# Patient Record
Sex: Male | Born: 1955 | Race: White | Hispanic: No | Smoking: Never smoker
Health system: Southern US, Community
[De-identification: ages and names within clinical notes are randomized; demographics above are authoritative.]

## PROBLEM LIST (undated history)

## (undated) DIAGNOSIS — Z8619 Personal history of other infectious and parasitic diseases: Secondary | ICD-10-CM

## (undated) HISTORY — DX: Personal history of other infectious and parasitic diseases: Z86.19

---

## 2003-07-16 HISTORY — PX: OTHER SURGICAL HISTORY: SHX169

## 2005-07-02 ENCOUNTER — Emergency Department: Payer: Self-pay | Admitting: Emergency Medicine

## 2006-10-23 ENCOUNTER — Ambulatory Visit: Payer: Self-pay

## 2007-08-31 DIAGNOSIS — I10 Essential (primary) hypertension: Secondary | ICD-10-CM | POA: Insufficient documentation

## 2007-08-31 DIAGNOSIS — R7303 Prediabetes: Secondary | ICD-10-CM | POA: Insufficient documentation

## 2007-08-31 DIAGNOSIS — F17200 Nicotine dependence, unspecified, uncomplicated: Secondary | ICD-10-CM | POA: Insufficient documentation

## 2007-08-31 DIAGNOSIS — K219 Gastro-esophageal reflux disease without esophagitis: Secondary | ICD-10-CM | POA: Insufficient documentation

## 2007-12-14 DIAGNOSIS — E782 Mixed hyperlipidemia: Secondary | ICD-10-CM | POA: Insufficient documentation

## 2008-08-02 DIAGNOSIS — J309 Allergic rhinitis, unspecified: Secondary | ICD-10-CM | POA: Insufficient documentation

## 2008-10-31 DIAGNOSIS — Z8249 Family history of ischemic heart disease and other diseases of the circulatory system: Secondary | ICD-10-CM | POA: Insufficient documentation

## 2009-05-31 DIAGNOSIS — K429 Umbilical hernia without obstruction or gangrene: Secondary | ICD-10-CM | POA: Insufficient documentation

## 2009-08-15 HISTORY — PX: AORTIC VALVE REPLACEMENT: SHX41

## 2009-08-15 HISTORY — PX: CARDIAC CATHETERIZATION: SHX172

## 2009-08-18 ENCOUNTER — Ambulatory Visit: Payer: Self-pay | Admitting: Cardiovascular Disease

## 2009-08-23 DIAGNOSIS — Z952 Presence of prosthetic heart valve: Secondary | ICD-10-CM | POA: Insufficient documentation

## 2009-09-18 ENCOUNTER — Ambulatory Visit: Payer: Self-pay | Admitting: Family Medicine

## 2009-11-10 DIAGNOSIS — E559 Vitamin D deficiency, unspecified: Secondary | ICD-10-CM | POA: Insufficient documentation

## 2010-11-30 ENCOUNTER — Ambulatory Visit: Payer: Self-pay | Admitting: Family Medicine

## 2010-12-14 HISTORY — PX: COLONOSCOPY: SHX174

## 2010-12-26 ENCOUNTER — Ambulatory Visit: Payer: Self-pay | Admitting: Gastroenterology

## 2010-12-26 LAB — HM COLONOSCOPY

## 2011-06-15 HISTORY — PX: HERNIA REPAIR: SHX51

## 2011-06-28 ENCOUNTER — Ambulatory Visit: Payer: Self-pay | Admitting: Surgery

## 2011-07-05 ENCOUNTER — Ambulatory Visit: Payer: Self-pay | Admitting: Surgery

## 2011-12-24 ENCOUNTER — Ambulatory Visit: Payer: Self-pay | Admitting: Family Medicine

## 2012-02-18 HISTORY — PX: DOPPLER ECHOCARDIOGRAPHY: SHX263

## 2012-12-18 IMAGING — US ABDOMEN ULTRASOUND
1 series · 14 of 25 positions shown · non-contrast
Comparison: none

REASON FOR EXAM: Elevated liver enzymes
COMMENTS:

PROCEDURE:     US  - US ABDOMEN GENERAL SURVEY  - December 24, 2011  [DATE]
RESULT:     Comparison: None
TECHNIQUE: Multiple gray-scale and color-flow Doppler images of the abdomen
are presented for review.

[Series 1: abdomen ultrasound · 0.40mm/px · 14 of 81 slices shown]
[im 1/81]
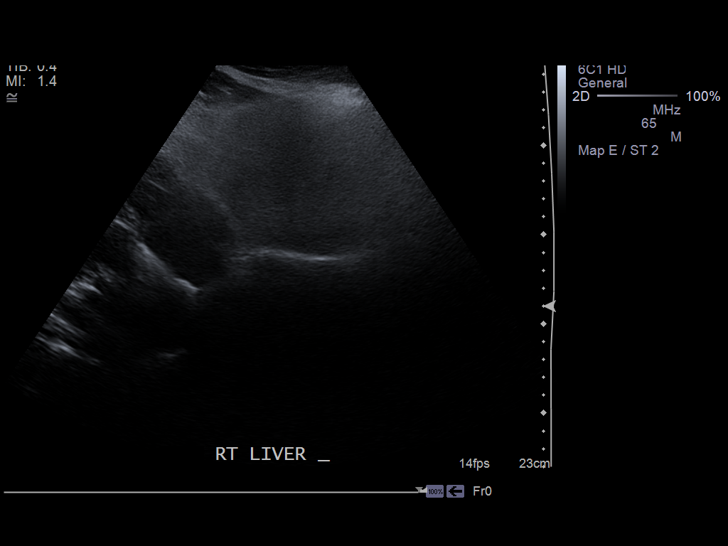
[im 7/81]
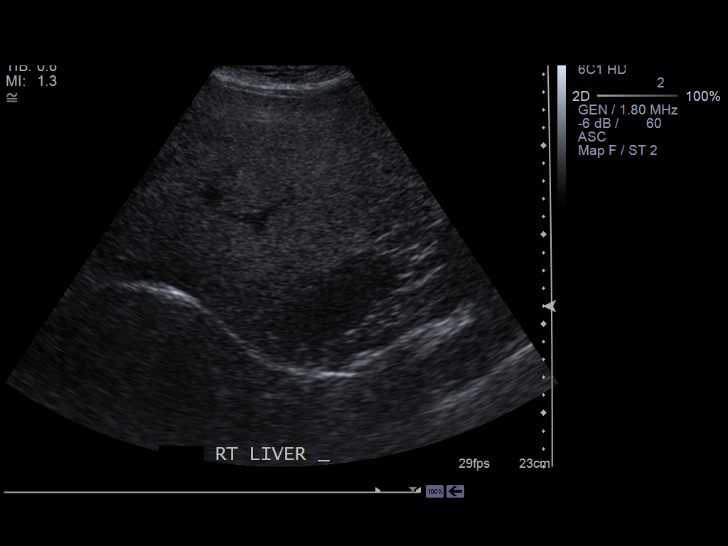
[im 14/81]
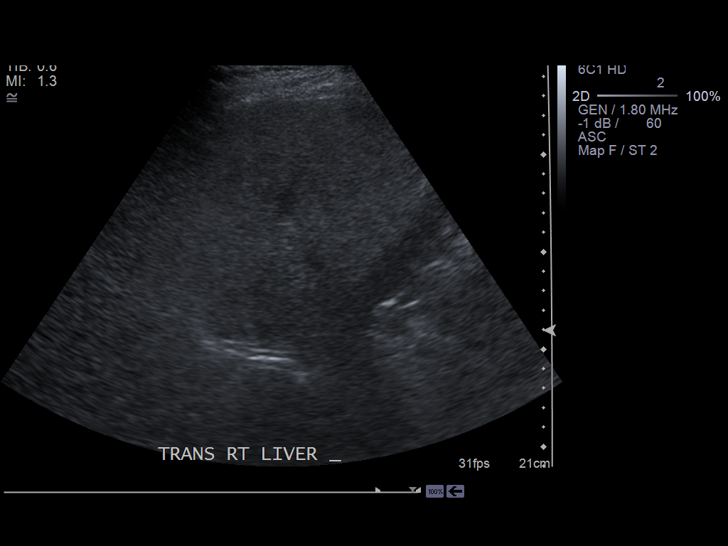
[im 21/81]
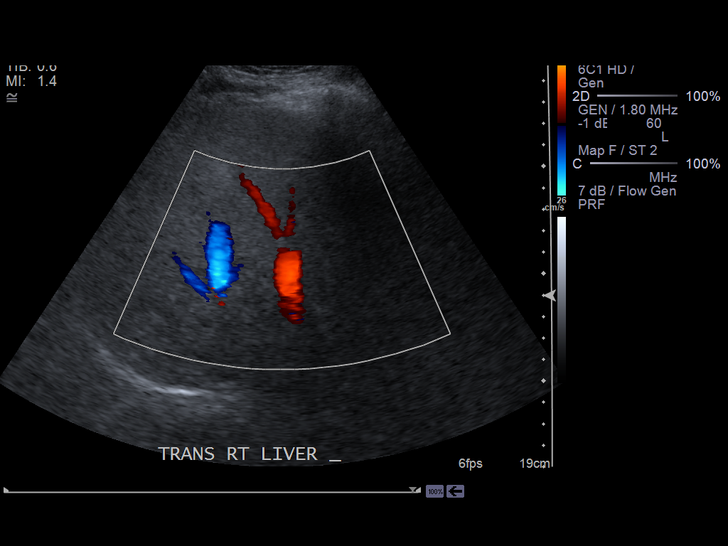
[im 27/81]
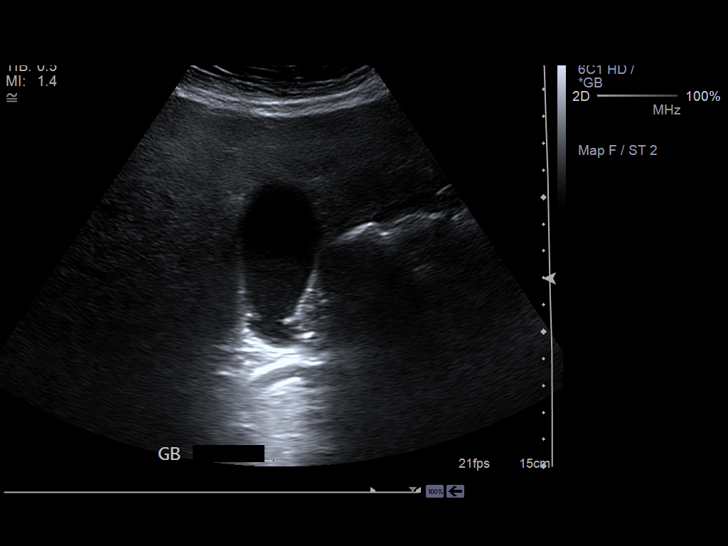
[im 31/81]
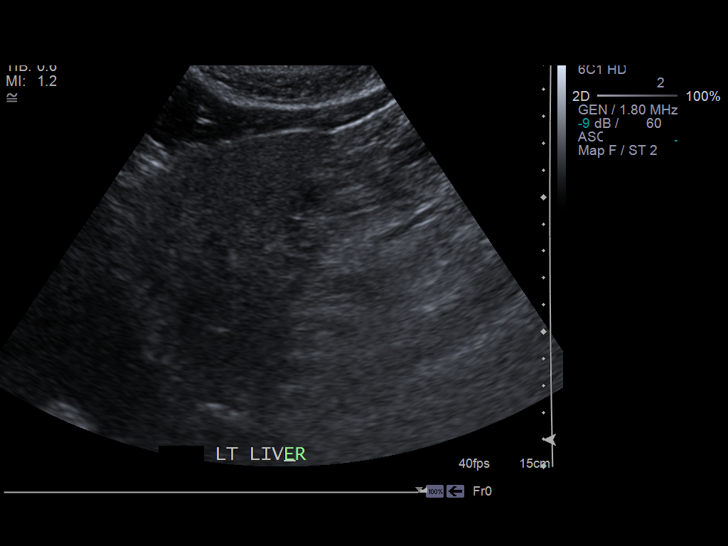
[im 37/81]
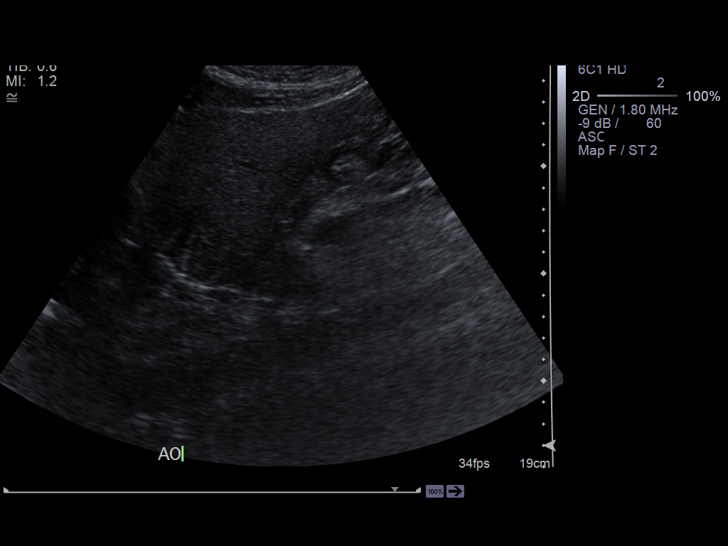
[im 44/81]
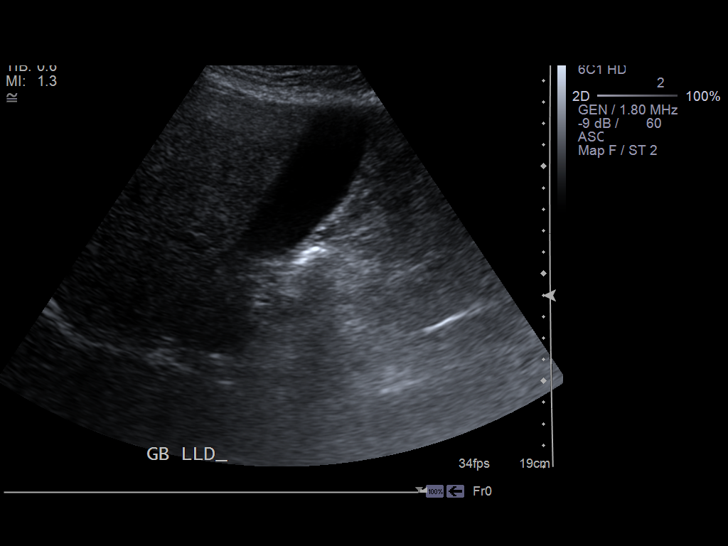
[im 51/81]
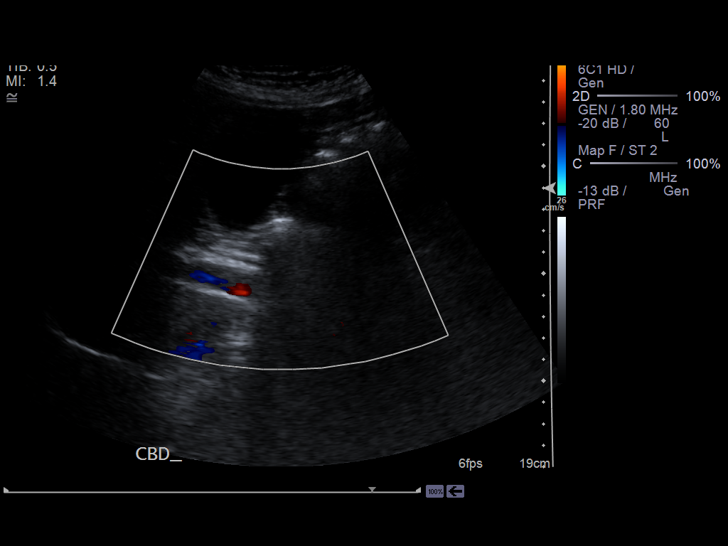
[im 54/81]
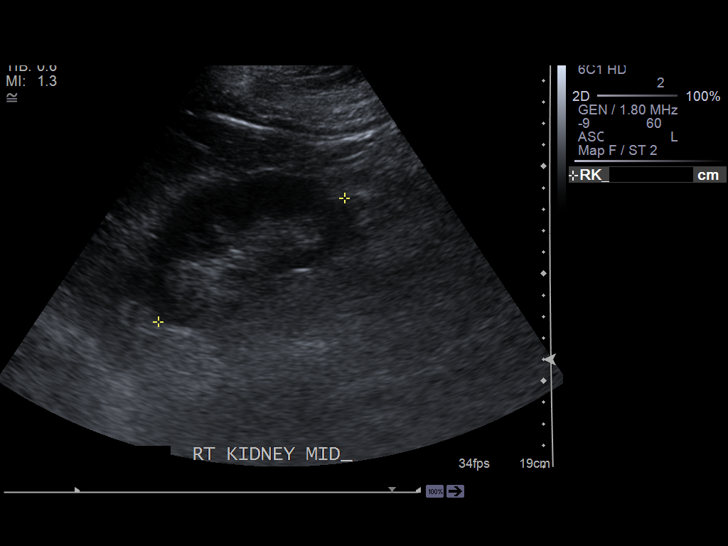
[im 61/81]
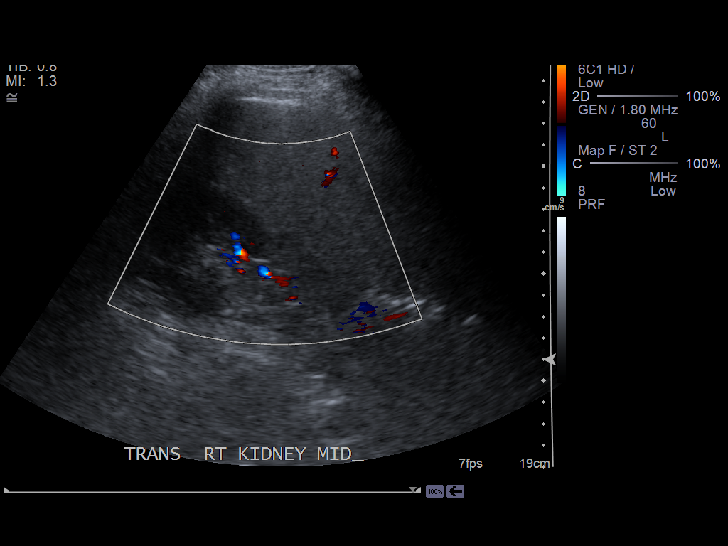
[im 67/81]
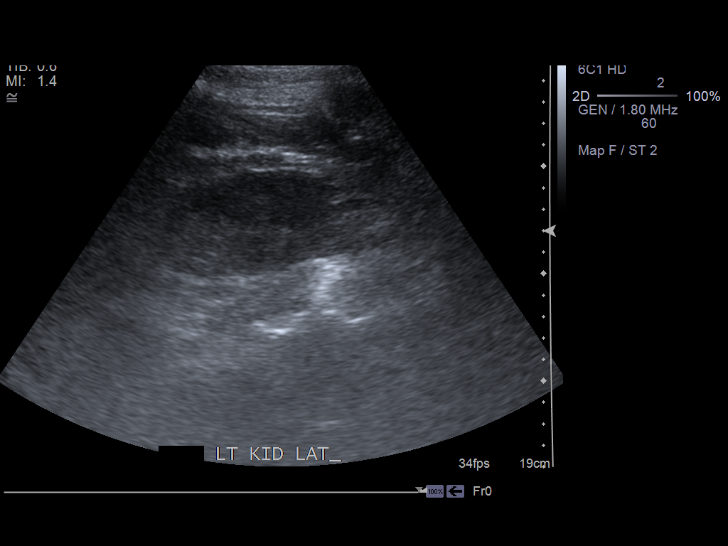
[im 74/81]
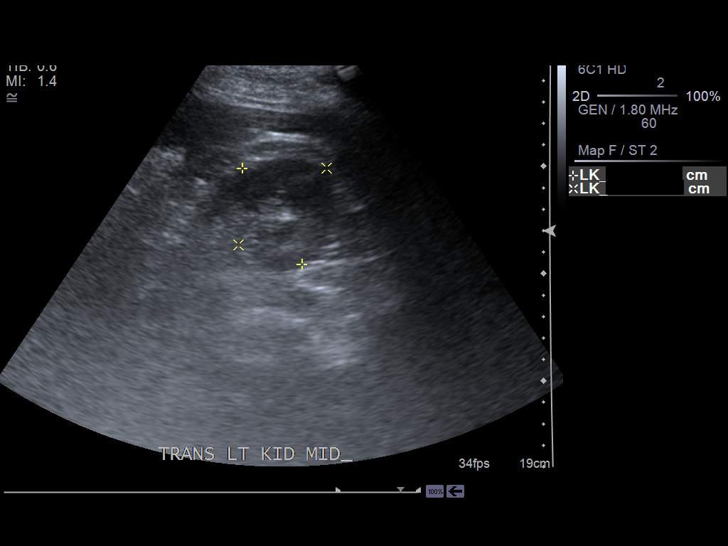
[im 81/81]
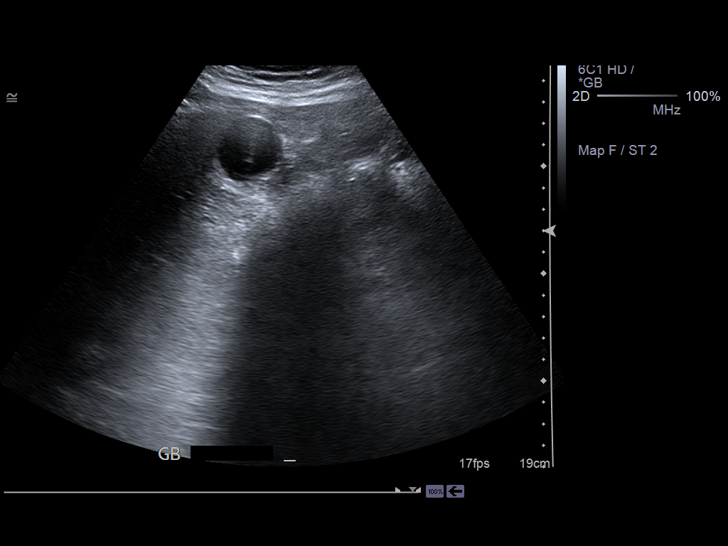

[14 of 25 positions shown; findings below may reference images not displayed]

FINDINGS: The liver demonstrates a coarse echotexture. The liver is without evidence
of a focal hepatic lesion.

There is no cholelithiasis or biliary sludge. There is no intra- or
extrahepatic biliary ductal dilatation. The common duct measures 5.2 mm in
maximal diameter. There is no gallbladder wall thickening, pericholecystic
fluid, or sonographic Murphy's sign.

The visualized portion of the pancreas is normal in echogenicity. The spleen
is unremarkable. Bilateral kidneys are normal in echogenicity and size. The
right kidney measures 10.4 x 5.7 x 5.9 cm. The left kidney measures 11.5 x
5.3 x 5.5 cm. There are no renal calculi or hydronephrosis. The abdominal
aorta and IVC are unremarkable.
IMPRESSION: The liver demonstrates a coarse echotexture which can be seen with
hepatocellular disease.

## 2013-03-24 ENCOUNTER — Ambulatory Visit: Payer: Self-pay | Admitting: Family Medicine

## 2013-04-14 ENCOUNTER — Emergency Department: Payer: Self-pay | Admitting: Internal Medicine

## 2013-04-14 LAB — CBC WITH DIFFERENTIAL/PLATELET
Basophil #: 0.1 10*3/uL (ref 0.0–0.1)
Basophil %: 0.9 %
Eosinophil #: 0.1 10*3/uL (ref 0.0–0.7)
Eosinophil %: 1.8 %
HCT: 37.3 % — ABNORMAL LOW (ref 40.0–52.0)
HGB: 13.3 g/dL (ref 13.0–18.0)
Lymphocyte #: 0.9 10*3/uL — ABNORMAL LOW (ref 1.0–3.6)
Lymphocyte %: 12.8 %
MCH: 34.1 pg — ABNORMAL HIGH (ref 26.0–34.0)
MCHC: 35.5 g/dL (ref 32.0–36.0)
MCV: 96 fL (ref 80–100)
Monocyte #: 0.7 x10 3/mm (ref 0.2–1.0)
Monocyte %: 9.4 %
Neutrophil #: 5.5 10*3/uL (ref 1.4–6.5)
Neutrophil %: 75.1 %
Platelet: 166 10*3/uL (ref 150–440)
RBC: 3.88 10*6/uL — ABNORMAL LOW (ref 4.40–5.90)
RDW: 13.3 % (ref 11.5–14.5)
WBC: 7.4 10*3/uL (ref 3.8–10.6)

## 2013-04-14 LAB — COMPREHENSIVE METABOLIC PANEL
Albumin: 3.8 g/dL (ref 3.4–5.0)
Anion Gap: 5 — ABNORMAL LOW (ref 7–16)
Calcium, Total: 9.3 mg/dL (ref 8.5–10.1)
Chloride: 96 mmol/L — ABNORMAL LOW (ref 98–107)
Co2: 28 mmol/L (ref 21–32)
EGFR (African American): >60
Glucose: 123 mg/dL — ABNORMAL HIGH (ref 65–99)
Potassium: 4.6 mmol/L (ref 3.5–5.1)
Sodium: 129 mmol/L — ABNORMAL LOW (ref 136–145)

## 2013-04-14 LAB — SEDIMENTATION RATE: Erythrocyte Sed Rate: 16 mm/hr (ref 0–20)

## 2013-04-15 ENCOUNTER — Ambulatory Visit: Payer: Self-pay | Admitting: Specialist

## 2013-04-16 ENCOUNTER — Ambulatory Visit: Payer: Self-pay | Admitting: Specialist

## 2013-04-16 HISTORY — PX: FOOT SURGERY: SHX648

## 2013-10-21 ENCOUNTER — Ambulatory Visit: Payer: Self-pay | Admitting: Family Medicine

## 2013-11-30 ENCOUNTER — Inpatient Hospital Stay: Payer: Self-pay | Admitting: Internal Medicine

## 2013-11-30 LAB — COMPREHENSIVE METABOLIC PANEL
Albumin: 4.2 g/dL (ref 3.4–5.0)
Alkaline Phosphatase: 98 U/L
Anion Gap: 6 — ABNORMAL LOW (ref 7–16)
BILIRUBIN TOTAL: 0.7 mg/dL (ref 0.2–1.0)
BUN: 23 mg/dL — ABNORMAL HIGH (ref 7–18)
CALCIUM: 9 mg/dL (ref 8.5–10.1)
CHLORIDE: 96 mmol/L — AB (ref 98–107)
Co2: 29 mmol/L (ref 21–32)
Creatinine: 1.13 mg/dL (ref 0.60–1.30)
EGFR (African American): >60
Glucose: 134 mg/dL — ABNORMAL HIGH (ref 65–99)
OSMOLALITY: 268 (ref 275–301)
Potassium: 4.5 mmol/L (ref 3.5–5.1)
SGOT(AST): 60 U/L — ABNORMAL HIGH (ref 15–37)
SGPT (ALT): 51 U/L (ref 12–78)
Sodium: 131 mmol/L — ABNORMAL LOW (ref 136–145)
Total Protein: 7.2 g/dL (ref 6.4–8.2)

## 2013-11-30 LAB — CBC WITH DIFFERENTIAL/PLATELET
Comment - H1-Com1: NORMAL
Comment - H1-Com2: NORMAL
HCT: 41.4 % (ref 40.0–52.0)
HGB: 14.2 g/dL (ref 13.0–18.0)
LYMPHS PCT: 13 %
MCH: 33.7 pg (ref 26.0–34.0)
MCHC: 34.3 g/dL (ref 32.0–36.0)
MCV: 98 fL (ref 80–100)
Monocytes: 8 %
NRBC/100 WBC: 1 /
Platelet: 223 10*3/uL (ref 150–440)
RBC: 4.22 10*6/uL — AB (ref 4.40–5.90)
RDW: 13.6 % (ref 11.5–14.5)
SEGMENTED NEUTROPHILS: 79 %
WBC: 10.1 10*3/uL (ref 3.8–10.6)

## 2013-11-30 LAB — URINALYSIS, COMPLETE
BILIRUBIN, UR: NEGATIVE
Bacteria: NONE SEEN
Blood: NEGATIVE
GLUCOSE, UR: NEGATIVE mg/dL (ref 0–75)
Hyaline Cast: 3
Ketone: NEGATIVE
LEUKOCYTE ESTERASE: NEGATIVE
Nitrite: NEGATIVE
Ph: 5 (ref 4.5–8.0)
Protein: NEGATIVE
RBC, UR: NONE SEEN /HPF (ref 0–5)
Specific Gravity: 1.016 (ref 1.003–1.030)
Squamous Epithelial: <1
WBC UR: <1 /HPF (ref 0–5)

## 2013-11-30 LAB — LIPASE, BLOOD: LIPASE: 2060 U/L — AB (ref 73–393)

## 2013-11-30 LAB — APTT: Activated PTT: 37.7 secs — ABNORMAL HIGH (ref 23.6–35.9)

## 2013-11-30 LAB — TROPONIN I: Troponin-I: <0.02 ng/mL

## 2013-11-30 LAB — PROTIME-INR
INR: 3.2
Prothrombin Time: 32.1 secs — ABNORMAL HIGH (ref 11.5–14.7)

## 2013-12-01 LAB — LIPASE, BLOOD: LIPASE: 1126 U/L — AB (ref 73–393)

## 2013-12-01 LAB — LIPID PANEL
CHOLESTEROL: 252 mg/dL — AB (ref 0–200)
HDL Cholesterol: 85 mg/dL — ABNORMAL HIGH (ref 40–60)
LDL CHOLESTEROL, CALC: 153 mg/dL — AB (ref 0–100)
Triglycerides: 68 mg/dL (ref 0–200)
VLDL CHOLESTEROL, CALC: 14 mg/dL (ref 5–40)

## 2013-12-01 LAB — BASIC METABOLIC PANEL
ANION GAP: 7 (ref 7–16)
BUN: 12 mg/dL (ref 7–18)
CHLORIDE: 97 mmol/L — AB (ref 98–107)
CO2: 28 mmol/L (ref 21–32)
Calcium, Total: 8.2 mg/dL — ABNORMAL LOW (ref 8.5–10.1)
Creatinine: 1.04 mg/dL (ref 0.60–1.30)
EGFR (African American): >60
Glucose: 129 mg/dL — ABNORMAL HIGH (ref 65–99)
Osmolality: 266 (ref 275–301)
POTASSIUM: 3.8 mmol/L (ref 3.5–5.1)
SODIUM: 132 mmol/L — AB (ref 136–145)

## 2013-12-02 LAB — LIPASE, BLOOD: LIPASE: 815 U/L — AB (ref 73–393)

## 2013-12-03 LAB — PROTIME-INR
INR: 1.7
PROTHROMBIN TIME: 19.6 s — AB (ref 11.5–14.7)

## 2013-12-03 LAB — LIPASE, BLOOD: Lipase: 821 U/L — ABNORMAL HIGH (ref 73–393)

## 2013-12-05 LAB — CULTURE, BLOOD (SINGLE)

## 2014-03-16 LAB — MICROALBUMIN, URINE: Microalb, Ur: NEGATIVE

## 2014-04-15 LAB — PSA: PSA: 1.1

## 2014-06-20 ENCOUNTER — Ambulatory Visit: Payer: Self-pay | Admitting: Specialist

## 2014-06-20 LAB — POTASSIUM: Potassium: 3.7 mmol/L (ref 3.5–5.1)

## 2014-06-24 ENCOUNTER — Ambulatory Visit: Payer: Self-pay | Admitting: Specialist

## 2014-06-24 LAB — PROTIME-INR
INR: 1.6
PROTHROMBIN TIME: 18.3 s — AB (ref 11.5–14.7)

## 2014-09-15 LAB — BASIC METABOLIC PANEL
BUN: 12 mg/dL (ref 4–21)
Creatinine: 1.1 mg/dL (ref 0.6–1.3)
GLUCOSE: 125 mg/dL
Potassium: 4.7 mmol/L (ref 3.4–5.3)
Sodium: 139 mmol/L (ref 137–147)

## 2014-09-15 LAB — HEMOGLOBIN A1C: HEMOGLOBIN A1C: 5.4

## 2014-09-15 LAB — LIPID PANEL
Cholesterol: 286 mg/dL — AB (ref 0–200)
HDL: 32 mg/dL — AB (ref 35–70)
Triglycerides: 930 mg/dL — AB (ref 40–160)

## 2014-09-15 LAB — HEPATIC FUNCTION PANEL: ALT: 47 U/L — AB (ref 10–40)

## 2014-11-04 NOTE — Op Note (Signed)
PATIENT NAME:  Isaac Hancock, [name] MR#:  [id number] DATE OF BIRTH:  08-14-1955  DATE OF PROCEDURE:  04/16/2013  PREOPERATIVE DIAGNOSIS: Foreign body, left little toe.   POSTOPERATIVE DIAGNOSIS: Foreign body, left little toe.   PROCEDURE: Removal of foreign body, left little toe.   SURGEON: Clare Gandyhristopher E. Hedaya Latendresse, MD   ANESTHESIA: General.   COMPLICATIONS: None.   TOURNIQUET TIME: 18 minutes.   DESCRIPTION OF PROCEDURE: Ancef 1 gram was given intravenously prior to the procedure. General anesthesia is induced. The left foot and lower leg are thoroughly prepped with alcohol and ChloraPrep and draped in standard sterile fashion. The Esmarch is used to wrap out the foot, ankle and lower leg. Tourniquet is elevated about the calf to 350 mmHg. Under loupe magnification, a 1-1/2-inch longitudinal incision is made along the volar lateral aspect of the plantar aspect of the left little toe. The dissection is carefully carried down to the area of the foreign body. Localization is provided with the FluoroScan. The foreign body is located and is seen to be encased within scar tissue. This is removed and is felt to be a 5 mm x 4 mm sharp metallic piece of metal. The wound is thoroughly irrigated multiple times. Skin is closed with 4-0 nylon. Soft bulky dressing is applied. The tourniquet is released. The patient is returned to the recovery room in satisfactory condition, having tolerated the procedure quite well.  ____________________________ Clare Gandyhristopher E. Polina Burmaster, MD ces:OSi D: 04/17/2013 07:40:11 ET T: 04/17/2013 08:15:33 ET JOB#: [id number]  cc: Clare Gandyhristopher E. Tita Terhaar, MD, <Dictator> Clare GandyHRISTOPHER E Abhiram Criado MD ELECTRONICALLY SIGNED 04/28/2013 6:31

## 2014-11-05 NOTE — Op Note (Signed)
PATIENT NAME:  Isaac Hancock, Isaac Hancock MR#:  [id number] DATE OF BIRTH:  Dec 04, 1955  DATE OF PROCEDURE:  06/24/2014  PREOPERATIVE DIAGNOSIS: Bilateral carpal tunnel syndrome.   POSTOPERATIVE DIAGNOSIS: Bilateral carpal tunnel syndrome.   PROCEDURES: 1.  Right carpal tunnel release.  2.  Left carpal tunnel release.   SURGEON: Myra Rudehristopher Ebany Bowermaster, M.D.   ANESTHESIA: General.   COMPLICATIONS: None.   TOURNIQUET TIME: 14 minutes on the right and 8 minutes on the left.   DESCRIPTION OF PROCEDURE: After adequate induction of general anesthesia, both upper extremities were thoroughly prepped with alcohol and ChloraPrep and draped in standard sterile fashion. Identical procedures were performed on each side. The extremity is wrapped out with the Esmarch bandage and pneumatic tourniquet elevated to 250 mmHg. Under loupe magnification, standard volar carpal tunnel incision is made and the dissection carried down to the transverse retinacular ligament. This is incised in the midportion using the knife. The distal release is performed with the small scissors. The proximal release is performed with the small scissors and the carpal tunnel scissors. On each side there is seen to be moderate compression of the median nerve directly beneath the transverse ligament. There is minimal synovitis present and no mass lesion. Careful check both proximally and distally was made to ensure that no residual compression was present. The wound is thoroughly irrigated multiple times. Skin edges are infiltrated with 0.5% plain Marcaine. The skin is closed with 4-0 nylon. A soft bulky dressing is applied. The patient is returned to the recovery room in satisfactory condition having tolerated the procedure quite well.  ____________________________ Clare Gandyhristopher E. Laird Runnion, MD ces:sb D: 06/24/2014 09:00:11 ET T: 06/24/2014 11:43:27 ET JOB#: [id number]  cc: Clare Gandyhristopher E. Kunio Cummiskey, MD, <Dictator> Clare GandyHRISTOPHER E Deyonte Cadden MD ELECTRONICALLY SIGNED  06/25/2014 14:06

## 2014-11-05 NOTE — H&P (Signed)
PATIENT NAME:  Isaac Hancock, Isaac Hancock MR#:  [id number] DATE OF BIRTH:  1956/04/17  DATE OF ADMISSION:  11/30/2013  PRIMARY CARE PHYSICIAN:  Dr. Sherrie MustacheFisher.  CHIEF COMPLAINT:  Abdominal pain, nausea.  HISTORY OF PRESENT ILLNESS:  A 59 year old Caucasian male patient with history of heavy alcohol use of 6 to 12 beers a day presents to the Emergency Room with acute onset of abdominal pain earlier today, along with nausea, did not have any vomiting, diarrhea, melena, hematemesis. Here in the Emergency Room, the patient has been found to have a lipase of 2000, along with CT scan of the abdomen showing acute pancreatitis and is being admitted to the hospitalist service. The patient never had pancreatitis before. He normally drinks 6 to 8 beers a day. Over the weekend, he did have 12 beers. Presently, he seems to have some shaking, tremors. His last drink was yesterday. When I asked him when was the last time he went without a drink for a day, and he mentioned this was today and does not remember the last time he went without a drink for 24 hours.   PAST MEDICAL HISTORY: 1.  History of severe aortic stenosis status post aortic valve replacement.  2.  Hypertension.  3.  Sleep apnea.  4.  Diet-controlled diabetes mellitus.   SOCIAL HISTORY: The patient does not smoke. Drinks 6 to 12 beers a day. No illicit drug use. Lives at home with his wife. 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  CODE STATUS: FULL CODE.   FAMILY HISTORY: CAD.   HOME MEDICATIONS: 1.  Allopurinol 300 mg oral 2 tablets daily.  2.  Enalapril 20 mg 2 times a day.  3.  Metoprolol 200 mg oral once a day.  4.  Norvasc 10 mg oral once a day.  5.  Percocet 5/325 one tablet every 4 hours as needed.  6.  Vitamin D2 50,000 international units once a week.  7.  Vytorin 10/40 oral once a day.  8.  Warfarin 6 mg daily, but 7 mg on Monday, Wednesday and Friday.   REVIEW OF SYSTEMS: CONSTITUTIONAL: Complains of fatigue.  EYES: No blurred  vision, pain or redness.  ENT: No tinnitus, hearing loss. RESPIRATORY: No cough, wheeze, hemoptysis.  CARDIOVASCULAR: No chest pain, orthopnea, edema.  GASTROINTESTINAL: Has nausea and abdominal pain.  GENITOURINARY: No dysuria, hematuria or frequency.  ENDOCRINE: No polyuria, nocturia or thyroid problems.  HEMATOLOGIC AND LYMPHATIC: No anemia, easy bruising, bleeding. INTEGUMENT:  No acne, rash, lesion.  MUSCULOSKELETAL: No back pain, arthritis.  NEUROLOGIC: No focal numbness, weakness, seizure.  PSYCHIATRIC: No anxiety or depression.  LABORATORY:  Today show glucose 134, BUN 23, creatinine 1.13. Sodium 139, potassium 4.5 with lipase 2000. AST, ALT, alkaline phosphatase and bilirubin normal. Troponin less than 0.02. WBC 10.9, hemoglobin 14.2, platelets of 223. INR 3.2.   CT scan of the abdomen shows acute pancreatitis. No pseudocyst or pancreatic abscess.   Urinalysis shows no bacteria. Lactic acid 1.5.   EKG shows normal sinus rhythm.   ASSESSMENT AND PLAN: 1.  Acute alcoholic pancreatitis. We will put the patient on liquid diet at this time, along with pain control. Put her on IV fluids. I have counseled the patient to quit alcohol. He has requested some help. We will consult the case manager for further input with the case. Repeat lipase in the morning. No gallstones found on CT scan.  2.  Hyponatremia secondary to dehydration and alcohol. The patient will be on IV fluids. We  will repeat in the morning.  3.  Aortic valve replacement. The patient is presently on Coumadin. INR is therapeutic at 3.2. We will continue his home medication.  4.  Hypertension. Continue home medication.  5.  Deep venous thrombosis prophylaxis on Coumadin.   TIME SPENT TODAY ON THIS CASE:  40 minutes.   ____________________________ Molinda Bailiff Karn Derk, MD srs:ce D: 11/30/2013 18:30:07 ET T: 11/30/2013 18:48:07 ET JOB#: [id number]  cc: Wardell Heath R. Sharmain Lastra, MD, <Dictator> Demetrios Isaacs. Sherrie Mustache, MD Orie Fisherman  MD ELECTRONICALLY SIGNED 12/10/2013 14:12

## 2014-11-05 NOTE — Discharge Summary (Signed)
PATIENT NAME:  Isaac Hancock, [name] MR#:  [id number] DATE OF BIRTH:  1955-12-09  DATE OF ADMISSION:  11/30/2013 DATE OF DISCHARGE:  12/03/2013  1. Acute alcoholic pancreatitis, now resolved back to baseline and tolerating diet.  2. Hyponatremia secondary to dehydration, now resolved with IV hydration. 3. Hyperlipidemia, on statin.   SECONDARY DIAGNOSES:   1. Severe aortic stenosis, status post aortic valve replacement.  2. Hypertension.  3. Sleep apnea.  4. Diet controlled diabetes mellitus.  CONSULTATIONS: None.   PROCEDURES AND RADIOLOGY:  1. Abdominal ultrasound on 21st of May was negative for any cholelithiasis.  2. CT scan of the abdomen and pelvis with contrast on 19th of May showed acute pancreatitis. No pseudocysts.  3. Abdominal 3-way with PA of chest on 19th of May showed no evidence of acute cardiopulmonary disease.   MAJOR LABORATORY PANEL: UA on admission was negative.   Blood cultures x 2 were negative.   HISTORY AND SHORT HOSPITAL COURSE: The patient is a 59 year old male with above-mentioned medical problems who was admitted for abdominal pain and nausea and was found to have acute pancreatitis. This was thought to be due to alcohol. Please see Dr. Eddie NorthSudini's dictated history and physical for further details. The patient also had hyponatremia which was resolved with IV hydration. The patient's lipase was slowly improving and his diet was slowly advanced to regular which he tolerated and was feeling much better, was close to his baseline on the 22nd of May and was discharged home in stable condition.   VITAL SIGNS: On the date of discharge, as follows: Temperature 98.4, heart rate 78 per minute, respirations 20 per minute, blood pressure 114/70 mmHg. He was saturating 98% on room air.   PHYSICAL EXAMINATION:  On the date of discharge: CARDIOVASCULAR: S1, S2 normal. No murmurs, rubs, gallops. LUNGS: Clear to auscultation bilaterally. No wheezing, rales, rhonchi, or crepitation.   ABDOMEN: Soft, benign.  NEUROLOGIC: Nonfocal examination.   All other physical examination remained at baseline.   DISCHARGE MEDICATIONS:  1. Norvasc 10 mg p.o. at bedtime. 2. Enalapril 20 mg p.o. b.i.d.  3. Metoprolol 200 mg p.o. at bedtime.  4. Vitamin D2 at 50,000 international units once a week on Monday.  5. Allopurinol 300 mg p.o. 2 tablets at bedtime.  6. Vytorin 10/40 mg 1 tablet p.o. at bedtime.  7. Warfarin 5 mg p.o. every other day with 6 mg every other day.  8. Lasix 20 mg p.o. daily.  9. Loratadine 10 mg p.o. daily as needed.   DISCHARGE DIET: Low sodium, low fat, low cholesterol.   DISCHARGE ACTIVITY: As tolerated.   DISCHARGE INSTRUCTIONS AND FOLLOWUP: The patient was instructed to follow up with his primary care physician, Dr. Mila Merryonald Fisher, in 1-2 weeks. He was instructed to check PT/INR on coming Monday on the 25th of May with results forwarded to his primary care physician for Coumadin dose adjustment.   TOTAL TIME DISCHARGING THIS PATIENT: 45 minutes.   ____________________________ Ellamae SiaVipul S. Sherryll BurgerShah, MD vss:dd D: 12/05/2013 16:39:26 ET T: 12/06/2013 02:57:25 ET JOB#: [id number]  cc: Traylon Schimming S. Sherryll BurgerShah, MD, <Dictator> Demetrios Isaac E. Sherrie MustacheFisher, MD  Patricia PesaVIPUL S Barnie Sopko MD ELECTRONICALLY SIGNED 12/07/2013 20:47

## 2014-11-27 IMAGING — US ABDOMEN ULTRASOUND LIMITED
1 series · 14 of 25 positions shown · non-contrast
Comparison: None.

CLINICAL DATA: PANCREATITIS

EXAM:
US ABDOMEN LIMITED - RIGHT UPPER QUADRANT

[Series 1: abdomen ultrasound limited · 0.25mm/px · 14 of 42 slices shown]
[im 1/42]
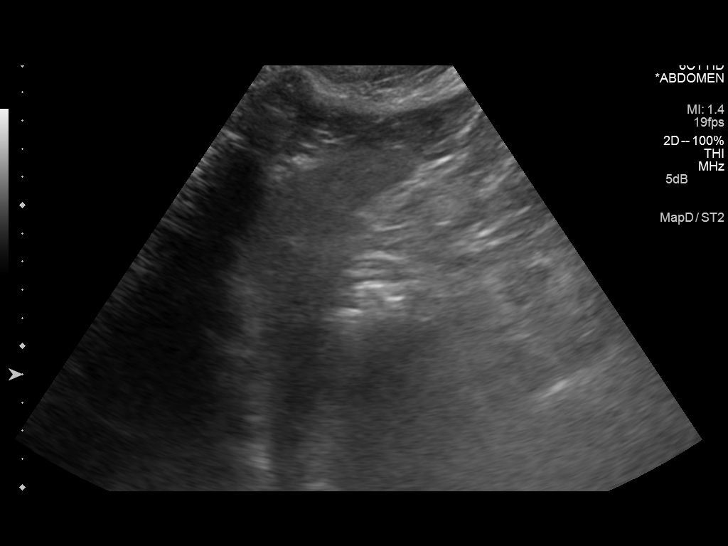
[im 4/42]
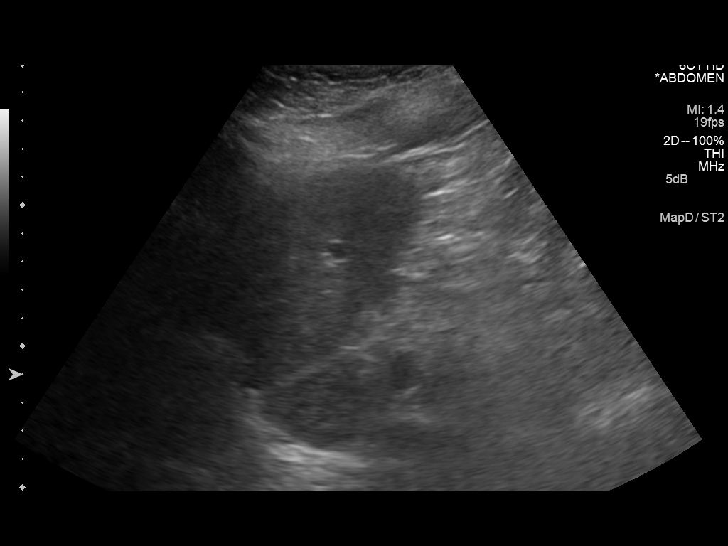
[im 7/42]
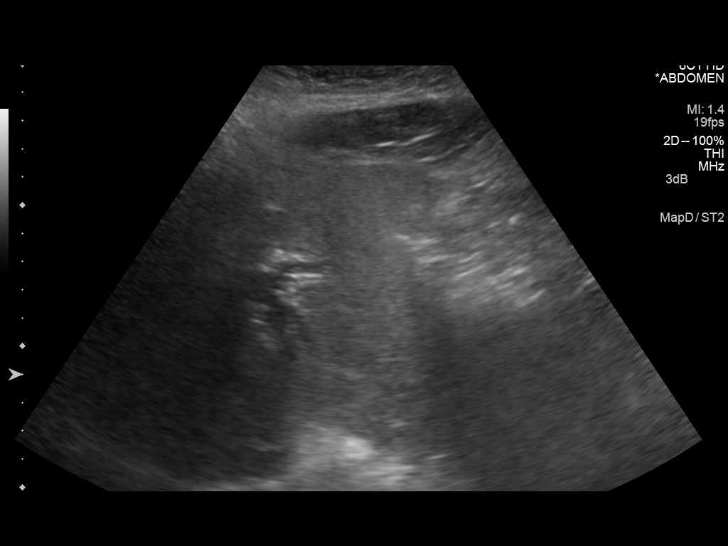
[im 11/42]
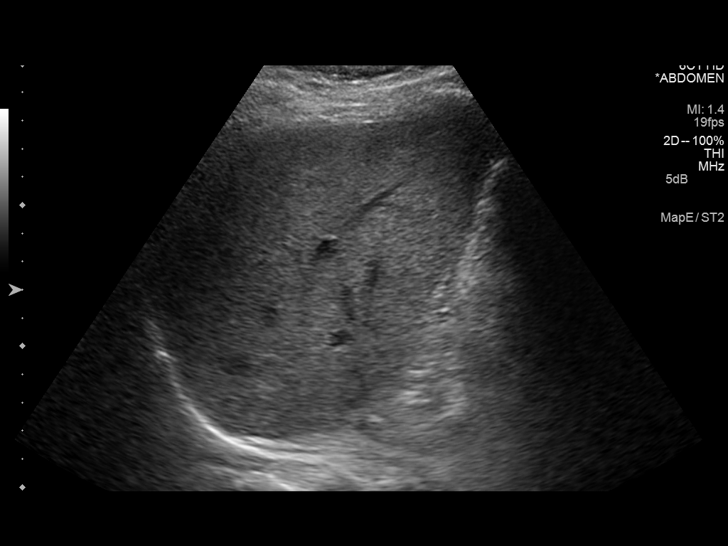
[im 14/42]
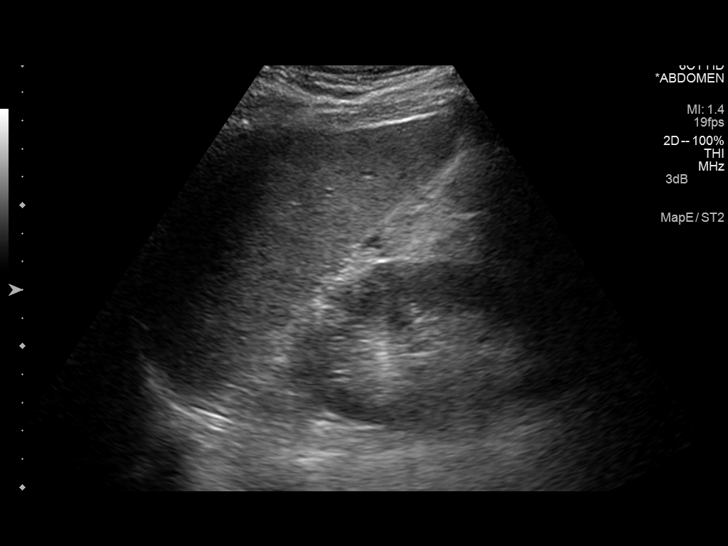
[im 16/42]
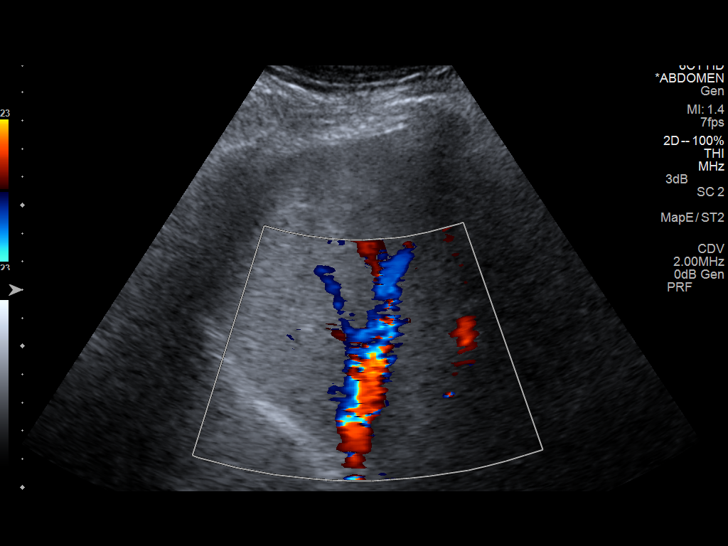
[im 19/42]
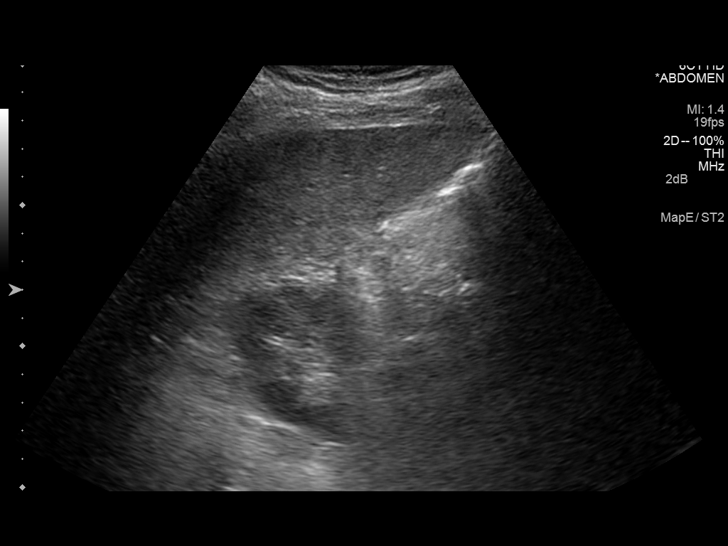
[im 23/42]
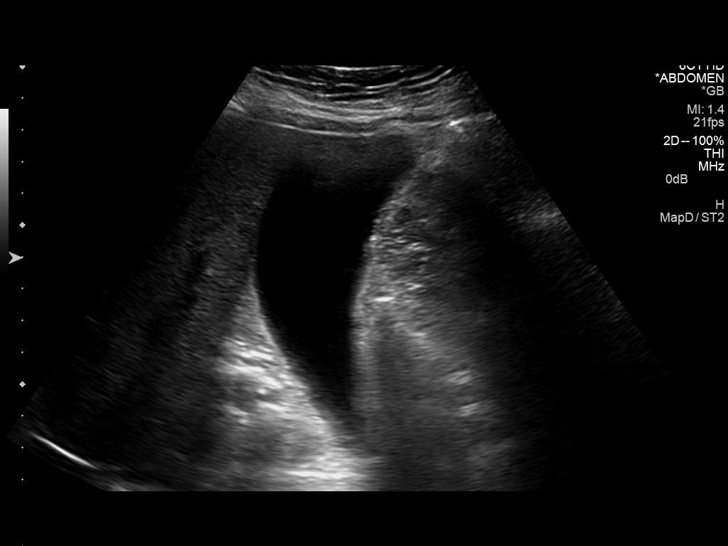
[im 26/42]
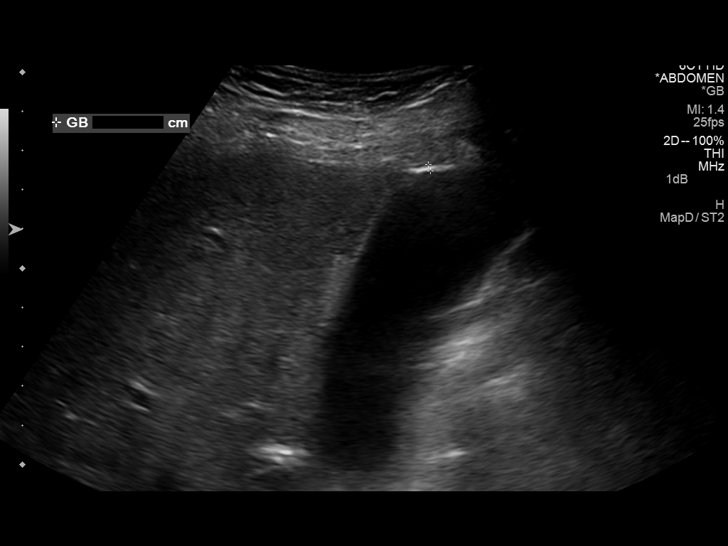
[im 28/42]
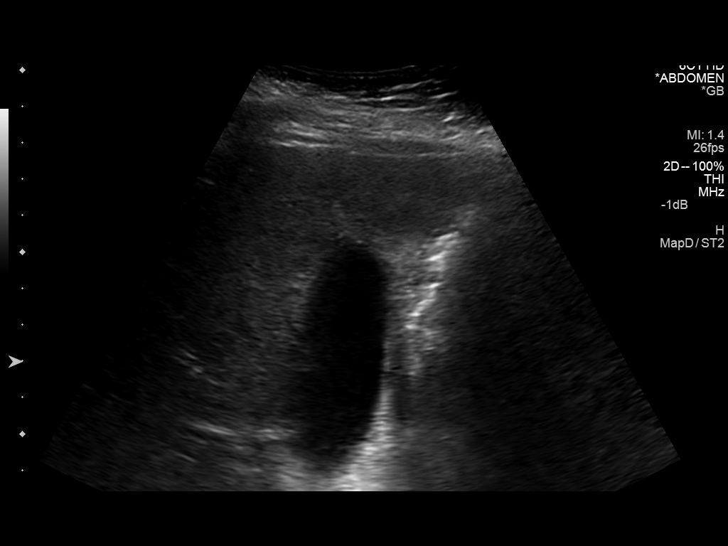
[im 31/42]
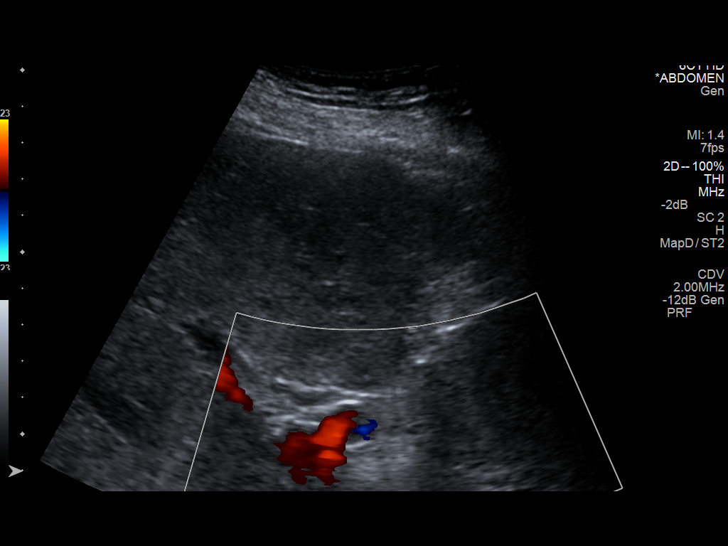
[im 35/42]
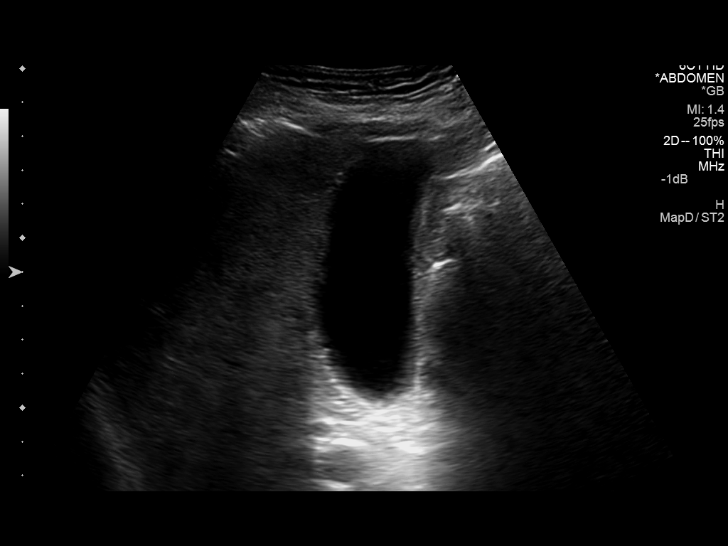
[im 38/42]
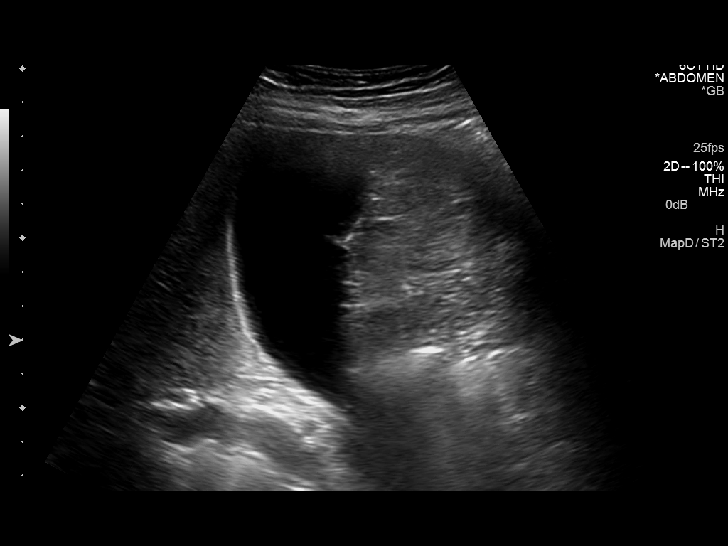
[im 42/42]
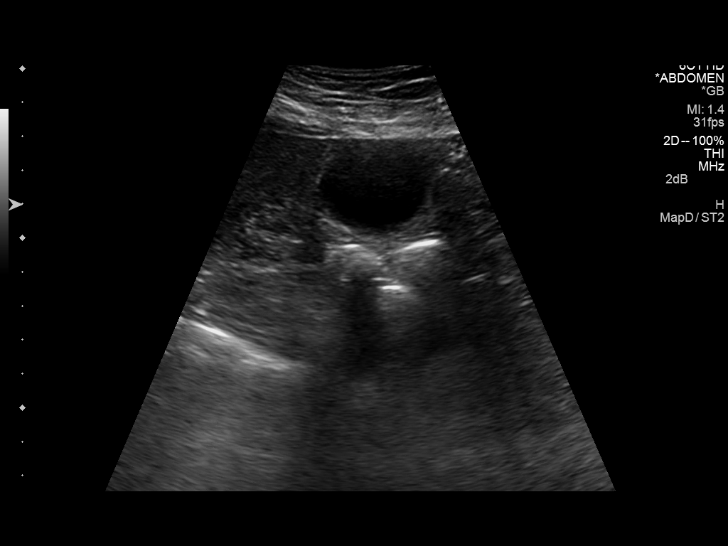

[14 of 25 positions shown; findings below may reference images not displayed]

FINDINGS: Gallbladder:

No gallstones or wall thickening visualized. No sonographic Murphy
sign noted.

Common bile duct:

Diameter: 4.7 mm

Liver:

No focal lesion identified. Relatively coarse hepatic echotexture
with mild increased echogenicity. .
IMPRESSION: 1. No cholelithiasis.

2. Relatively coarse hepatic echotexture with mild increased
echogenicity. This appearance can be seen with hepatocellular
disease. Correlate with liver function tests.

## 2014-12-15 ENCOUNTER — Other Ambulatory Visit: Payer: Self-pay | Admitting: Family Medicine

## 2014-12-26 ENCOUNTER — Other Ambulatory Visit: Payer: Self-pay | Admitting: *Deleted

## 2014-12-26 NOTE — Telephone Encounter (Signed)
Refill request for Hydrocodone 5-325 mg Last filled by MD on- 12/13/2014 #30 x0 Last Appt: 09/15/2014 Next Appt: none Please advise refill?

## 2014-12-28 MED ORDER — HYDROCODONE-ACETAMINOPHEN 5-325 MG PO TABS: 1.0000 | ORAL_TABLET | Freq: Four times a day (QID) | ORAL | Status: DC | PRN

## 2015-01-06 ENCOUNTER — Other Ambulatory Visit: Payer: Self-pay | Admitting: *Deleted

## 2015-01-11 MED ORDER — HYDROCODONE-ACETAMINOPHEN 5-325 MG PO TABS: 1.0000 | ORAL_TABLET | Freq: Four times a day (QID) | ORAL | Status: DC | PRN

## 2015-01-19 ENCOUNTER — Other Ambulatory Visit: Payer: Self-pay | Admitting: *Deleted

## 2015-01-24 ENCOUNTER — Other Ambulatory Visit: Payer: Self-pay | Admitting: *Deleted

## 2015-01-26 ENCOUNTER — Other Ambulatory Visit: Payer: Self-pay | Admitting: Family Medicine

## 2015-01-26 MED ORDER — HYDROCODONE-ACETAMINOPHEN 5-325 MG PO TABS: 1.0000 | ORAL_TABLET | Freq: Four times a day (QID) | ORAL | Status: DC | PRN

## 2015-01-29 ENCOUNTER — Other Ambulatory Visit: Payer: Self-pay | Admitting: Family Medicine

## 2015-02-14 ENCOUNTER — Other Ambulatory Visit: Payer: Self-pay | Admitting: *Deleted

## 2015-02-14 MED ORDER — HYDROCODONE-ACETAMINOPHEN 5-325 MG PO TABS: 1.0000 | ORAL_TABLET | Freq: Four times a day (QID) | ORAL | Status: DC | PRN

## 2015-02-21 ENCOUNTER — Other Ambulatory Visit: Payer: Self-pay | Admitting: *Deleted

## 2015-02-21 NOTE — Telephone Encounter (Signed)
This was just filled last week and is too early. Also, he is overdue for office visit which needs to be scheduled before we can approve any additional refills.

## 2015-03-06 ENCOUNTER — Other Ambulatory Visit: Payer: Self-pay | Admitting: Family Medicine

## 2015-03-08 ENCOUNTER — Other Ambulatory Visit: Payer: Self-pay | Admitting: *Deleted

## 2015-03-08 MED ORDER — HYDROCODONE-ACETAMINOPHEN 5-325 MG PO TABS: 1.0000 | ORAL_TABLET | Freq: Four times a day (QID) | ORAL | Status: DC | PRN

## 2015-03-24 ENCOUNTER — Other Ambulatory Visit: Payer: Self-pay | Admitting: *Deleted

## 2015-03-24 MED ORDER — HYDROCODONE-ACETAMINOPHEN 5-325 MG PO TABS: 1.0000 | ORAL_TABLET | Freq: Four times a day (QID) | ORAL | Status: DC | PRN

## 2015-04-04 ENCOUNTER — Other Ambulatory Visit: Payer: Self-pay | Admitting: *Deleted

## 2015-04-07 ENCOUNTER — Other Ambulatory Visit: Payer: Self-pay | Admitting: Family Medicine

## 2015-04-07 MED ORDER — OMEPRAZOLE 20 MG PO CPDR: 20.0000 mg | DELAYED_RELEASE_CAPSULE | Freq: Every day | ORAL | Status: DC

## 2015-04-10 ENCOUNTER — Other Ambulatory Visit: Payer: Self-pay

## 2015-04-10 NOTE — Telephone Encounter (Signed)
Pt e-mailed refill request for HYDROcodone-acetaminophen (NORCO/VICODIN) 5-325 MG per tablet. Thanks TNP

## 2015-04-10 NOTE — Telephone Encounter (Signed)
Patient sent e mail requesting refill 

## 2015-04-11 MED ORDER — HYDROCODONE-ACETAMINOPHEN 5-325 MG PO TABS: 1.0000 | ORAL_TABLET | Freq: Four times a day (QID) | ORAL | Status: DC | PRN

## 2015-04-25 ENCOUNTER — Other Ambulatory Visit: Payer: Self-pay | Admitting: *Deleted

## 2015-04-26 MED ORDER — HYDROCODONE-ACETAMINOPHEN 5-325 MG PO TABS: 1.0000 | ORAL_TABLET | Freq: Four times a day (QID) | ORAL | Status: DC | PRN

## 2015-05-10 ENCOUNTER — Other Ambulatory Visit: Payer: Self-pay | Admitting: *Deleted

## 2015-05-11 MED ORDER — HYDROCODONE-ACETAMINOPHEN 5-325 MG PO TABS: 1.0000 | ORAL_TABLET | Freq: Four times a day (QID) | ORAL | Status: DC | PRN

## 2015-05-23 ENCOUNTER — Other Ambulatory Visit: Payer: Self-pay | Admitting: *Deleted

## 2015-05-25 MED ORDER — HYDROCODONE-ACETAMINOPHEN 5-325 MG PO TABS: 1.0000 | ORAL_TABLET | Freq: Four times a day (QID) | ORAL | Status: DC | PRN

## 2015-06-06 ENCOUNTER — Other Ambulatory Visit: Payer: Self-pay | Admitting: *Deleted

## 2015-06-07 MED ORDER — HYDROCODONE-ACETAMINOPHEN 5-325 MG PO TABS: 1.0000 | ORAL_TABLET | Freq: Four times a day (QID) | ORAL | Status: DC | PRN

## 2015-06-20 ENCOUNTER — Other Ambulatory Visit: Payer: Self-pay | Admitting: *Deleted

## 2015-06-21 MED ORDER — HYDROCODONE-ACETAMINOPHEN 5-325 MG PO TABS: 1.0000 | ORAL_TABLET | Freq: Four times a day (QID) | ORAL | Status: DC | PRN

## 2015-06-21 NOTE — Telephone Encounter (Signed)
Patient is due for follow up office regarding pain medications, blood pressure, and cholesterol. Need to schedule this before the end of the month.

## 2015-06-21 NOTE — Telephone Encounter (Signed)
Patient was notified that he needs to schedule appt. Patient stated that he will cb for appt when he knows his schedule.

## 2015-06-26 ENCOUNTER — Other Ambulatory Visit: Payer: Self-pay | Admitting: *Deleted

## 2015-06-26 DIAGNOSIS — F419 Anxiety disorder, unspecified: Secondary | ICD-10-CM

## 2015-06-27 NOTE — Telephone Encounter (Signed)
Patient is overdue for office visit. Needs to be scheduled before any refills can be approved.

## 2015-07-04 ENCOUNTER — Other Ambulatory Visit: Payer: Self-pay | Admitting: *Deleted

## 2015-07-04 MED ORDER — HYDROCODONE-ACETAMINOPHEN 5-325 MG PO TABS: 1.0000 | ORAL_TABLET | Freq: Four times a day (QID) | ORAL | Status: DC | PRN

## 2015-07-04 NOTE — Telephone Encounter (Signed)
Patient scheduled a f/u ov for 07/11/2015.

## 2015-07-06 DIAGNOSIS — K219 Gastro-esophageal reflux disease without esophagitis: Secondary | ICD-10-CM | POA: Insufficient documentation

## 2015-07-06 DIAGNOSIS — M549 Dorsalgia, unspecified: Secondary | ICD-10-CM

## 2015-07-06 DIAGNOSIS — G5600 Carpal tunnel syndrome, unspecified upper limb: Secondary | ICD-10-CM | POA: Insufficient documentation

## 2015-07-06 DIAGNOSIS — Z9989 Dependence on other enabling machines and devices: Secondary | ICD-10-CM

## 2015-07-06 DIAGNOSIS — M109 Gout, unspecified: Secondary | ICD-10-CM | POA: Insufficient documentation

## 2015-07-06 DIAGNOSIS — R945 Abnormal results of liver function studies: Secondary | ICD-10-CM | POA: Insufficient documentation

## 2015-07-06 DIAGNOSIS — G4733 Obstructive sleep apnea (adult) (pediatric): Secondary | ICD-10-CM | POA: Insufficient documentation

## 2015-07-06 DIAGNOSIS — F419 Anxiety disorder, unspecified: Secondary | ICD-10-CM | POA: Insufficient documentation

## 2015-07-06 DIAGNOSIS — G8929 Other chronic pain: Secondary | ICD-10-CM | POA: Insufficient documentation

## 2015-07-06 DIAGNOSIS — R7989 Other specified abnormal findings of blood chemistry: Secondary | ICD-10-CM | POA: Insufficient documentation

## 2015-07-06 DIAGNOSIS — K859 Acute pancreatitis without necrosis or infection, unspecified: Secondary | ICD-10-CM | POA: Insufficient documentation

## 2015-07-06 DIAGNOSIS — M5432 Sciatica, left side: Secondary | ICD-10-CM | POA: Insufficient documentation

## 2015-07-06 DIAGNOSIS — M25539 Pain in unspecified wrist: Secondary | ICD-10-CM | POA: Insufficient documentation

## 2015-07-07 MED ORDER — ALPRAZOLAM 0.5 MG PO TABS: ORAL_TABLET | ORAL | Status: DC

## 2015-07-07 NOTE — Telephone Encounter (Signed)
Patient scheduled appt for 07/11/2015.

## 2015-07-07 NOTE — Telephone Encounter (Signed)
Please call in alprazolam.  

## 2015-07-07 NOTE — Telephone Encounter (Signed)
Rx called in to pharmacy. 

## 2015-07-11 ENCOUNTER — Ambulatory Visit (INDEPENDENT_AMBULATORY_CARE_PROVIDER_SITE_OTHER): Payer: BLUE CROSS/BLUE SHIELD | Admitting: Family Medicine

## 2015-07-11 ENCOUNTER — Encounter: Payer: Self-pay | Admitting: Family Medicine

## 2015-07-11 VITALS — BP 92/50 | HR 72 | Temp 98.8°F | Resp 16 | Ht 72.0 in | Wt 219.0 lb

## 2015-07-11 DIAGNOSIS — R7303 Prediabetes: Secondary | ICD-10-CM

## 2015-07-11 DIAGNOSIS — I10 Essential (primary) hypertension: Secondary | ICD-10-CM | POA: Diagnosis not present

## 2015-07-11 DIAGNOSIS — Z23 Encounter for immunization: Secondary | ICD-10-CM | POA: Diagnosis not present

## 2015-07-11 DIAGNOSIS — R7989 Other specified abnormal findings of blood chemistry: Secondary | ICD-10-CM | POA: Diagnosis not present

## 2015-07-11 DIAGNOSIS — E782 Mixed hyperlipidemia: Secondary | ICD-10-CM

## 2015-07-11 DIAGNOSIS — R945 Abnormal results of liver function studies: Secondary | ICD-10-CM

## 2015-07-11 DIAGNOSIS — Z1159 Encounter for screening for other viral diseases: Secondary | ICD-10-CM | POA: Diagnosis not present

## 2015-07-11 LAB — POCT GLYCOSYLATED HEMOGLOBIN (HGB A1C)
Est. average glucose Bld gHb Est-mCnc: 108
HEMOGLOBIN A1C: 5.4

## 2015-07-11 MED ORDER — AMLODIPINE BESYLATE 10 MG PO TABS: 5.0000 mg | ORAL_TABLET | Freq: Every day | ORAL | Status: DC

## 2015-07-11 NOTE — Addendum Note (Signed)
Addended by: Marlene LardMILLER, Hezekiah Veltre M on: 07/11/2015 10:06 AM   Modules accepted: Orders

## 2015-07-11 NOTE — Progress Notes (Signed)
Patient: Isaac Hancock K Lagrow Male    DOB: 1956-02-23   59 y.o.   MRN: [id number] Visit Date: 07/11/2015  Today's Provider: Mila Merryonald Fisher, MD   No chief complaint on file.  Subjective:    HPI   Follow-up for pre-diabetes from 09/15/2014, A1c was 5.4; no changes. Avoiding sweets and starchy food and has cut back on alcohol.   Follow-up for back pain from 09/15/2014; no changes. Pain has been bothering him more at work the last several months and is usually needing to take hydrocodone/apap twice a day. He feels awake, alert and mentally focused even after taking medication.  He does have a history of excessive alcohol use, but state he now only drinks a few beers on weekend days only. Does not drink alcohol at all during the week.     Hypertension, follow-up:  BP Readings from Last 3 Encounters:  09/15/14 98/52    He was last seen for hypertension 9 months ago.  BP at that visit was 98/52. Management since that visit includes; no changes.He reports good compliance with treatment. He is not having side effects. none  He is not exercising. He is not adherent to low salt diet.   Outside blood pressures are n/a. He is experiencing none.  Patient denies none.   Cardiovascular risk factors include pre-diabetes.  Use of agents associated with hypertension: none.   ----------------------------------------------------------------------    Lipid/Cholesterol, Follow-up:   Last seen for this 09/15/2014.  We were going to change to Crestor due to very high triglycerides when last checked but he states copay was over $100 a month, so he has made improvements with diet, cut back on alcohol consumption, and continued atorvasting 80mg .   Last Lipid Panel:    Component Value Date/Time   CHOL 286* 09/15/2014   CHOL 252* 12/01/2013 1734   TRIG 930* 09/15/2014   TRIG 68 12/01/2013 1734   HDL 32* 09/15/2014   HDL 85* 12/01/2013 1734   VLDL 14 12/01/2013 1734   LDLCALC 153* 12/01/2013  1734    He reports good compliance with treatment. He is not having side effects. none  Wt Readings from Last 3 Encounters:  09/15/14 225 lb (102.059 kg)    ----------------------------------------------------------------------  CAD: He continues to follow up with Dr. Welton FlakesKhan yearly and has PT checked monthly. Is doing well with no chest pain, heart flutters, or dyspnea.   Wrist pain States this has completely resolved since CTS treatment. Was originally prescribed allopurinol for suspected gout in wrist, but now he thinks it was entirely due to CTS. He is going to stop allopurinol for the time being.     Allergies no known allergies Previous Medications   ALLOPURINOL (ZYLOPRIM) 300 MG TABLET    TAKE 1 TABLET DAILY (NOTE CHANGE IN DOSE FROM 100 MG TO 300 MG)   ALPRAZOLAM (XANAX) 0.5 MG TABLET    1 Every six hours as needed.   AMLODIPINE (NORVASC) 10 MG TABLET    Take 10 mg by mouth daily.    ATORVASTATIN (LIPITOR) 80 MG TABLET    TAKE 1 TABLET EVERY EVENING   CYCLOBENZAPRINE (FLEXERIL) 5 MG TABLET    Take 5-10 mg by mouth every 8 (eight) hours as needed.    ENALAPRIL (VASOTEC) 20 MG TABLET    Take 20 mg by mouth 2 (two) times daily.    FEXOFENADINE (ALLEGRA) 60 MG TABLET    Take 60 mg by mouth 2 (two) times daily.  FUROSEMIDE (LASIX) 20 MG TABLET    Take 20 mg by mouth daily.    HYDROCODONE-ACETAMINOPHEN (NORCO/VICODIN) 5-325 MG TABLET    Take 1-2 tablets by mouth every 6 (six) hours as needed for moderate pain.   METOPROLOL (TOPROL-XL) 200 MG 24 HR TABLET    Take 200 mg by mouth daily.    MULTIPLE VITAMIN PO    Take 1 tablet by mouth daily.    NON FORMULARY       OMEPRAZOLE (PRILOSEC) 20 MG CAPSULE    Take 1 capsule (20 mg total) by mouth daily.   VITAMIN D, ERGOCALCIFEROL, (DRISDOL) 50000 UNITS CAPS CAPSULE    TAKE 1 CAPSULE ONCE A WEEK   WARFARIN (COUMADIN) 5 MG TABLET    Take 5 mg by mouth daily.     Review of Systems  Constitutional: Positive for appetite change. Negative  for fever and chills.       Appetite comes and goes  Respiratory: Negative for chest tightness, shortness of breath and wheezing.   Cardiovascular: Positive for palpitations and leg swelling. Negative for chest pain.       Left ankle swells  Gastrointestinal: Negative for nausea, vomiting and abdominal pain.    Social History  Substance Use Topics  . Smoking status: Never Smoker   . Smokeless tobacco: Current User    Types: Snuff, Chew     Comment: 2 cans per week  . Alcohol Use: No   Objective:   BP 92/50 mmHg  Pulse 72  Temp(Src) 98.8 F (37.1 C) (Oral)  Resp 16  Ht 6' (1.829 m)  Wt 219 lb (99.338 kg)  BMI 29.70 kg/m2  SpO2 96%  Physical Exam  General Appearance:    Alert, cooperative, no distress, obese  Eyes:    PERRL, conjunctiva/corneas clear, EOM's intact       Lungs:     Clear to auscultation bilaterally, respirations unlabored  Heart:    Regular rate and rhythm. Metallic click. Trace bilateral LE edema. No carotid bruits.   Neurologic:   Awake, alert, oriented x 3. No apparent focal neurological           defect.           Assessment & Plan:     1. Prediabetes Well controlled.   - POCT glycosylated hemoglobin (Hb A1C)  2. Liver function test abnormality  - Hepatic function panel  3. Hyperlipidemia, mixed He is tolerating atorvastatin well with no adverse effects.  Crestor was too expensive, but will soon be available generically.  - Lipid panel  4. Benign essential HTN BP has been consistently low. Will cut back on amlodipine due to having edema.  - Renal function panel - amLODipine (NORVASC) 10 MG tablet; Take 0.5 tablets (5 mg total) by mouth daily.  Dispense: 1 tablet; Refill: 0  His next shipment for amlodipine is due 09-04-15 and will change to  tablet.   5. Need for hepatitis C screening test  - Hepatitis C antibody  Hep B vaccine #3 given today Patient Instructions   Start taking 1/2 tablet of the amlodipine  daily   Stop  taking allopurinol so long as you are not having any gout attacks.            Mila Merry, MD  [facility]

## 2015-07-11 NOTE — Patient Instructions (Addendum)
   Start taking 1/2 tablet of the amlodipine 10mg  daily   Stop taking allopurinol so long as you are not having any gout attacks.

## 2015-07-11 NOTE — Addendum Note (Signed)
Addended by: Marlene LardMILLER, [place] M on: 07/11/2015 12:22 PM   Modules accepted: Orders

## 2015-07-12 ENCOUNTER — Telehealth: Payer: Self-pay | Admitting: *Deleted

## 2015-07-12 LAB — RENAL FUNCTION PANEL
Albumin: 4.3 g/dL (ref 3.5–5.5)
BUN/Creatinine Ratio: 13 (ref 9–20)
BUN: 14 mg/dL (ref 6–24)
CO2: 22 mmol/L (ref 18–29)
Calcium: 8.1 mg/dL — ABNORMAL LOW (ref 8.7–10.2)
Chloride: 98 mmol/L (ref 96–106)
Creatinine, Ser: 1.09 mg/dL (ref 0.76–1.27)
GFR, EST AFRICAN AMERICAN: 87 mL/min/{1.73_m2} (ref >59)
GFR, EST NON AFRICAN AMERICAN: 75 mL/min/{1.73_m2} (ref >59)
GLUCOSE: 119 mg/dL — AB (ref 65–99)
POTASSIUM: 4.7 mmol/L (ref 3.5–5.2)
Phosphorus: 5.7 mg/dL — ABNORMAL HIGH (ref 2.5–4.5)
SODIUM: 142 mmol/L (ref 134–144)

## 2015-07-12 LAB — HEPATIC FUNCTION PANEL
ALT: 39 IU/L (ref 0–44)
AST: 55 IU/L — AB (ref 0–40)
Alkaline Phosphatase: 112 IU/L (ref 39–117)
BILIRUBIN TOTAL: 0.5 mg/dL (ref 0.0–1.2)
BILIRUBIN, DIRECT: 0.19 mg/dL (ref 0.00–0.40)
Total Protein: 6.1 g/dL (ref 6.0–8.5)

## 2015-07-12 LAB — HEPATITIS C ANTIBODY: Hep C Virus Ab: <0.1 s/co ratio (ref 0.0–0.9)

## 2015-07-12 LAB — LIPID PANEL
CHOL/HDL RATIO: 11.5 ratio — AB (ref 0.0–5.0)
Cholesterol, Total: 298 mg/dL — ABNORMAL HIGH (ref 100–199)
HDL: 26 mg/dL — AB (ref >39)
Triglycerides: 1415 mg/dL (ref 0–149)

## 2015-07-12 MED ORDER — OMEGA-3-ACID ETHYL ESTERS 1 G PO CAPS
1.0000 g | ORAL_CAPSULE | Freq: Two times a day (BID) | ORAL | Status: DC
Start: 2015-07-12 — End: 2016-07-28

## 2015-07-12 NOTE — Telephone Encounter (Signed)
Patient was notified of results. Patient expressed understanding. Rx was sent to pharmacy.  

## 2015-07-12 NOTE — Telephone Encounter (Signed)
-----   Message from Malva Limesonald E Fisher, MD sent at 07/12/2015  8:08 AM EST ----- triglycerides are VERY high at 1,415.  They should be under 150. Need to start Lovaza 1 gram, 2 capsules twice a day, #120, rf x 3. Continue current dose of Lipitor. Follow up o.v. and labs in 3 months.

## 2015-07-19 ENCOUNTER — Other Ambulatory Visit: Payer: Self-pay | Admitting: *Deleted

## 2015-07-19 MED ORDER — HYDROCODONE-ACETAMINOPHEN 5-325 MG PO TABS: 1.0000 | ORAL_TABLET | Freq: Four times a day (QID) | ORAL | Status: DC | PRN

## 2015-08-07 ENCOUNTER — Other Ambulatory Visit: Payer: Self-pay

## 2015-08-07 MED ORDER — HYDROCODONE-ACETAMINOPHEN 5-325 MG PO TABS
1.0000 | ORAL_TABLET | Freq: Four times a day (QID) | ORAL | Status: DC | PRN
Start: 2015-08-07 — End: 2015-08-21

## 2015-08-07 NOTE — Telephone Encounter (Signed)
Patient requesting refill through email.

## 2015-08-21 ENCOUNTER — Other Ambulatory Visit: Payer: Self-pay | Admitting: *Deleted

## 2015-08-22 ENCOUNTER — Other Ambulatory Visit: Payer: Self-pay | Admitting: Family Medicine

## 2015-08-22 DIAGNOSIS — I10 Essential (primary) hypertension: Secondary | ICD-10-CM

## 2015-08-22 MED ORDER — AMLODIPINE BESYLATE 5 MG PO TABS: 5.0000 mg | ORAL_TABLET | Freq: Every day | ORAL | Status: DC

## 2015-08-22 MED ORDER — HYDROCODONE-ACETAMINOPHEN 5-325 MG PO TABS: 1.0000 | ORAL_TABLET | Freq: Four times a day (QID) | ORAL | Status: DC | PRN

## 2015-08-23 ENCOUNTER — Other Ambulatory Visit: Payer: Self-pay | Admitting: *Deleted

## 2015-08-23 DIAGNOSIS — F419 Anxiety disorder, unspecified: Secondary | ICD-10-CM

## 2015-08-23 MED ORDER — ALPRAZOLAM 0.5 MG PO TABS: ORAL_TABLET | ORAL | Status: DC

## 2015-08-23 NOTE — Telephone Encounter (Signed)
Rx called in to pharmacy. 

## 2015-08-23 NOTE — Telephone Encounter (Signed)
Please call in alprazolam.  

## 2015-08-24 ENCOUNTER — Telehealth: Payer: Self-pay | Admitting: Family Medicine

## 2015-08-24 NOTE — Telephone Encounter (Signed)
Isaac Hancock was notified. Isaac Hancock stated that she will speak to pt about ov appt with Dr. Sherrie Mustache and call us back if pt is agreeable.

## 2015-08-24 NOTE — Telephone Encounter (Signed)
Called [name] back for more information. [name] stated that pt has been unable to eat for the last few days. Patient has been unable to eat due to stomach upset. Patient has also been having headaches. [employer] has report from Dr. Bluford Kaufmann from 01/24/2012. Patient is requesting a referral to see Dr. Bluford Kaufmann.

## 2015-08-24 NOTE — Telephone Encounter (Signed)
Pt's wife [name]) calling stating that the pt is needing to go to see Dr. Bluford Kaufmann and would like to know if pt needs to make a appointment or can a referral be done without a office visit? Pt's wife states pt needs to be seen ASAP with Dr. Bluford Kaufmann.   Wife states to call her phone cause pt is having trouble with his phone,Wife's # is [phone].  Thanks CC

## 2015-08-24 NOTE — Telephone Encounter (Signed)
He needs to be seen here for initial evaluation of this.

## 2015-09-05 ENCOUNTER — Other Ambulatory Visit: Payer: Self-pay | Admitting: *Deleted

## 2015-09-05 MED ORDER — HYDROCODONE-ACETAMINOPHEN 5-325 MG PO TABS: 1.0000 | ORAL_TABLET | Freq: Four times a day (QID) | ORAL | Status: DC | PRN

## 2015-09-18 ENCOUNTER — Other Ambulatory Visit: Payer: Self-pay | Admitting: *Deleted

## 2015-09-18 MED ORDER — HYDROCODONE-ACETAMINOPHEN 5-325 MG PO TABS: 1.0000 | ORAL_TABLET | Freq: Four times a day (QID) | ORAL | Status: DC | PRN

## 2015-09-29 ENCOUNTER — Other Ambulatory Visit: Payer: Self-pay | Admitting: *Deleted

## 2015-09-29 MED ORDER — HYDROCODONE-ACETAMINOPHEN 5-325 MG PO TABS: 1.0000 | ORAL_TABLET | Freq: Four times a day (QID) | ORAL | Status: DC | PRN

## 2015-10-01 ENCOUNTER — Other Ambulatory Visit: Payer: Self-pay | Admitting: Family Medicine

## 2015-10-09 ENCOUNTER — Other Ambulatory Visit: Payer: Self-pay | Admitting: *Deleted

## 2015-10-09 MED ORDER — HYDROCODONE-ACETAMINOPHEN 5-325 MG PO TABS: 1.0000 | ORAL_TABLET | Freq: Four times a day (QID) | ORAL | Status: DC | PRN

## 2015-10-20 ENCOUNTER — Other Ambulatory Visit: Payer: Self-pay

## 2015-10-20 MED ORDER — HYDROCODONE-ACETAMINOPHEN 5-325 MG PO TABS: 1.0000 | ORAL_TABLET | Freq: Four times a day (QID) | ORAL | Status: DC | PRN

## 2015-10-20 NOTE — Telephone Encounter (Signed)
Patient sent e mail requesting refill. Patient call back is [phone].

## 2015-10-30 ENCOUNTER — Other Ambulatory Visit: Payer: Self-pay | Admitting: *Deleted

## 2015-10-30 MED ORDER — HYDROCODONE-ACETAMINOPHEN 5-325 MG PO TABS: 1.0000 | ORAL_TABLET | Freq: Four times a day (QID) | ORAL | Status: DC | PRN

## 2015-11-09 ENCOUNTER — Other Ambulatory Visit: Payer: Self-pay | Admitting: *Deleted

## 2015-11-09 MED ORDER — HYDROCODONE-ACETAMINOPHEN 5-325 MG PO TABS: 1.0000 | ORAL_TABLET | Freq: Four times a day (QID) | ORAL | Status: DC | PRN

## 2015-11-21 ENCOUNTER — Other Ambulatory Visit: Payer: Self-pay | Admitting: *Deleted

## 2015-11-21 MED ORDER — HYDROCODONE-ACETAMINOPHEN 5-325 MG PO TABS: 1.0000 | ORAL_TABLET | Freq: Four times a day (QID) | ORAL | Status: DC | PRN

## 2015-11-22 ENCOUNTER — Telehealth: Payer: Self-pay | Admitting: Family Medicine

## 2015-11-23 DIAGNOSIS — Z952 Presence of prosthetic heart valve: Secondary | ICD-10-CM | POA: Diagnosis not present

## 2015-12-01 ENCOUNTER — Other Ambulatory Visit: Payer: Self-pay | Admitting: *Deleted

## 2015-12-04 MED ORDER — HYDROCODONE-ACETAMINOPHEN 5-325 MG PO TABS: 1.0000 | ORAL_TABLET | Freq: Four times a day (QID) | ORAL | Status: DC | PRN

## 2015-12-17 ENCOUNTER — Other Ambulatory Visit: Payer: Self-pay | Admitting: Family Medicine

## 2015-12-18 ENCOUNTER — Other Ambulatory Visit: Payer: Self-pay | Admitting: *Deleted

## 2015-12-18 MED ORDER — HYDROCODONE-ACETAMINOPHEN 5-325 MG PO TABS: 1.0000 | ORAL_TABLET | Freq: Four times a day (QID) | ORAL | Status: DC | PRN

## 2016-01-01 DIAGNOSIS — G4733 Obstructive sleep apnea (adult) (pediatric): Secondary | ICD-10-CM | POA: Diagnosis not present

## 2016-01-02 ENCOUNTER — Other Ambulatory Visit: Payer: Self-pay | Admitting: *Deleted

## 2016-01-02 MED ORDER — HYDROCODONE-ACETAMINOPHEN 5-325 MG PO TABS: 1.0000 | ORAL_TABLET | Freq: Four times a day (QID) | ORAL | Status: DC | PRN

## 2016-01-07 ENCOUNTER — Other Ambulatory Visit: Payer: Self-pay | Admitting: Family Medicine

## 2016-01-17 ENCOUNTER — Other Ambulatory Visit: Payer: Self-pay | Admitting: *Deleted

## 2016-01-17 MED ORDER — HYDROCODONE-ACETAMINOPHEN 5-325 MG PO TABS: 1.0000 | ORAL_TABLET | Freq: Four times a day (QID) | ORAL | Status: DC | PRN

## 2016-01-19 DIAGNOSIS — Z952 Presence of prosthetic heart valve: Secondary | ICD-10-CM | POA: Diagnosis not present

## 2016-01-30 ENCOUNTER — Other Ambulatory Visit: Payer: Self-pay | Admitting: *Deleted

## 2016-01-30 MED ORDER — HYDROCODONE-ACETAMINOPHEN 5-325 MG PO TABS: 1.0000 | ORAL_TABLET | Freq: Four times a day (QID) | ORAL | Status: DC | PRN

## 2016-02-14 ENCOUNTER — Other Ambulatory Visit: Payer: Self-pay | Admitting: *Deleted

## 2016-02-14 MED ORDER — HYDROCODONE-ACETAMINOPHEN 5-325 MG PO TABS: 1.0000 | ORAL_TABLET | Freq: Four times a day (QID) | ORAL | 0 refills | Status: DC | PRN

## 2016-02-26 ENCOUNTER — Other Ambulatory Visit: Payer: Self-pay

## 2016-02-26 DIAGNOSIS — F419 Anxiety disorder, unspecified: Secondary | ICD-10-CM

## 2016-02-26 MED ORDER — HYDROCODONE-ACETAMINOPHEN 5-325 MG PO TABS: 1.0000 | ORAL_TABLET | Freq: Four times a day (QID) | ORAL | 0 refills | Status: DC | PRN

## 2016-02-26 MED ORDER — ALPRAZOLAM 0.5 MG PO TABS: ORAL_TABLET | ORAL | 5 refills | Status: DC

## 2016-02-26 NOTE — Telephone Encounter (Signed)
Last ov 07/10/16. Patient was advised to follow-up in 3 months to recheck labs.

## 2016-02-29 DIAGNOSIS — Z952 Presence of prosthetic heart valve: Secondary | ICD-10-CM | POA: Diagnosis not present

## 2016-03-12 ENCOUNTER — Other Ambulatory Visit: Payer: Self-pay | Admitting: *Deleted

## 2016-03-12 MED ORDER — HYDROCODONE-ACETAMINOPHEN 5-325 MG PO TABS: 1.0000 | ORAL_TABLET | Freq: Four times a day (QID) | ORAL | 0 refills | Status: DC | PRN

## 2016-03-25 ENCOUNTER — Other Ambulatory Visit: Payer: Self-pay | Admitting: *Deleted

## 2016-03-25 MED ORDER — HYDROCODONE-ACETAMINOPHEN 5-325 MG PO TABS: 1.0000 | ORAL_TABLET | Freq: Four times a day (QID) | ORAL | 0 refills | Status: DC | PRN

## 2016-04-03 ENCOUNTER — Other Ambulatory Visit: Payer: Self-pay | Admitting: Family Medicine

## 2016-04-03 MED ORDER — HYDROCODONE-ACETAMINOPHEN 5-325 MG PO TABS: 1.0000 | ORAL_TABLET | Freq: Four times a day (QID) | ORAL | 0 refills | Status: DC | PRN

## 2016-04-05 DIAGNOSIS — Z952 Presence of prosthetic heart valve: Secondary | ICD-10-CM | POA: Diagnosis not present

## 2016-04-05 DIAGNOSIS — I350 Nonrheumatic aortic (valve) stenosis: Secondary | ICD-10-CM | POA: Diagnosis not present

## 2016-04-08 DIAGNOSIS — Z952 Presence of prosthetic heart valve: Secondary | ICD-10-CM | POA: Diagnosis not present

## 2016-04-23 ENCOUNTER — Other Ambulatory Visit: Payer: Self-pay | Admitting: *Deleted

## 2016-04-23 MED ORDER — HYDROCODONE-ACETAMINOPHEN 5-325 MG PO TABS: 1.0000 | ORAL_TABLET | Freq: Four times a day (QID) | ORAL | 0 refills | Status: DC | PRN

## 2016-05-07 ENCOUNTER — Other Ambulatory Visit: Payer: Self-pay | Admitting: *Deleted

## 2016-05-07 MED ORDER — HYDROCODONE-ACETAMINOPHEN 5-325 MG PO TABS: 1.0000 | ORAL_TABLET | Freq: Four times a day (QID) | ORAL | 0 refills | Status: DC | PRN

## 2016-05-20 ENCOUNTER — Other Ambulatory Visit: Payer: Self-pay | Admitting: *Deleted

## 2016-05-20 MED ORDER — HYDROCODONE-ACETAMINOPHEN 5-325 MG PO TABS: 1.0000 | ORAL_TABLET | Freq: Four times a day (QID) | ORAL | 0 refills | Status: DC | PRN

## 2016-06-04 ENCOUNTER — Telehealth: Payer: Self-pay

## 2016-06-04 NOTE — Telephone Encounter (Signed)
Refill request from patient for Hydrocodone. Last filled on 05/20/16. LOV 07/11/15. please review-aa

## 2016-06-07 MED ORDER — HYDROCODONE-ACETAMINOPHEN 5-325 MG PO TABS: 1.0000 | ORAL_TABLET | Freq: Four times a day (QID) | ORAL | 0 refills | Status: DC | PRN

## 2016-06-14 DIAGNOSIS — Z952 Presence of prosthetic heart valve: Secondary | ICD-10-CM | POA: Diagnosis not present

## 2016-06-17 ENCOUNTER — Other Ambulatory Visit: Payer: Self-pay | Admitting: *Deleted

## 2016-06-17 MED ORDER — HYDROCODONE-ACETAMINOPHEN 5-325 MG PO TABS: 1.0000 | ORAL_TABLET | Freq: Four times a day (QID) | ORAL | 0 refills | Status: DC | PRN

## 2016-07-04 ENCOUNTER — Telehealth: Payer: Self-pay

## 2016-07-04 MED ORDER — HYDROCODONE-ACETAMINOPHEN 5-325 MG PO TABS: 1.0000 | ORAL_TABLET | Freq: Four times a day (QID) | ORAL | 0 refills | Status: DC | PRN

## 2016-07-04 NOTE — Telephone Encounter (Signed)
Patient need to schedule o.v. Before next refill.

## 2016-07-04 NOTE — Telephone Encounter (Signed)
Patient requesting refill. Patient has not been seen since 07/11/15. Last filled 06/17/2016. Please review. sd

## 2016-07-05 ENCOUNTER — Other Ambulatory Visit: Payer: Self-pay | Admitting: Family Medicine

## 2016-07-18 ENCOUNTER — Ambulatory Visit: Payer: Self-pay | Admitting: Family Medicine

## 2016-07-19 ENCOUNTER — Ambulatory Visit (INDEPENDENT_AMBULATORY_CARE_PROVIDER_SITE_OTHER): Payer: BLUE CROSS/BLUE SHIELD | Admitting: Family Medicine

## 2016-07-19 ENCOUNTER — Encounter: Payer: Self-pay | Admitting: Family Medicine

## 2016-07-19 VITALS — BP 104/54 | HR 68 | Temp 98.0°F | Resp 14 | Wt 206.0 lb

## 2016-07-19 DIAGNOSIS — F17200 Nicotine dependence, unspecified, uncomplicated: Secondary | ICD-10-CM | POA: Diagnosis not present

## 2016-07-19 DIAGNOSIS — F1190 Opioid use, unspecified, uncomplicated: Secondary | ICD-10-CM

## 2016-07-19 DIAGNOSIS — E559 Vitamin D deficiency, unspecified: Secondary | ICD-10-CM

## 2016-07-19 DIAGNOSIS — Z125 Encounter for screening for malignant neoplasm of prostate: Secondary | ICD-10-CM | POA: Diagnosis not present

## 2016-07-19 DIAGNOSIS — R7303 Prediabetes: Secondary | ICD-10-CM

## 2016-07-19 DIAGNOSIS — E782 Mixed hyperlipidemia: Secondary | ICD-10-CM

## 2016-07-19 DIAGNOSIS — G4730 Sleep apnea, unspecified: Secondary | ICD-10-CM

## 2016-07-19 DIAGNOSIS — I10 Essential (primary) hypertension: Secondary | ICD-10-CM | POA: Diagnosis not present

## 2016-07-19 MED ORDER — HYDROCODONE-ACETAMINOPHEN 5-325 MG PO TABS: 1.0000 | ORAL_TABLET | Freq: Four times a day (QID) | ORAL | 0 refills | Status: DC | PRN

## 2016-07-19 NOTE — Progress Notes (Signed)
Patient: Isaac Hancock Male    DOB: Oct 27, 1955   61 y.o.   MRN: [id number] Visit Date: 07/19/2016  Today's Provider: Mila Merryonald Jazsmin Couse, MD   Chief Complaint  Patient presents with  . Hypertension  . Hyperlipidemia  . Hyperglycemia  . Gout   Subjective:    HPI  Hypertension, follow-up:  BP Readings from Last 3 Encounters:  07/19/16 (!) 104/54  07/11/15 (!) 92/50  09/15/14 (!) 98/52    He was last seen for hypertension 1 years ago.  BP at that visit was 92/50. Management since that visit includes decreased amlodipine to 1/2 tablet, due to low BP, 08/22/15 changed amlodipine to 5 mg daily. He reports good compliance with treatment. He is not having side effects.  He is not exercising. He is adherent to low salt diet.   Outside blood pressures are not being checked. He is experiencing none.  Patient denies chest pain, chest pressure/discomfort, claudication, dyspnea, exertional chest pressure/discomfort, fatigue, irregular heart beat, lower extremity edema, near-syncope, orthopnea, palpitations, paroxysmal nocturnal dyspnea, syncope and tachypnea.   Cardiovascular risk factors include advanced age (older than 6255 for men, 1065 for women), dyslipidemia, hypertension and male gender.  Wt Readings from Last 3 Encounters:  07/19/16 206 lb (93.4 kg)  07/11/15 219 lb (99.3 kg)  09/15/14 225 lb (102.1 kg)   ------------------------------------------------------------------------   Lipid/Cholesterol, Follow-up:   Last seen for this1 years ago.  Management changes since that visit include started Lovaza 1g x2 BID and continuing atorvastatin.Generic Lovaza prescription was sent to pharmacy but prescription was not filled.  . Last Lipid Panel:    Component Value Date/Time   CHOL 298 (H) 07/11/2015 0932   CHOL 252 (H) 12/01/2013 1734   TRIG 1,415 (HH) 07/11/2015 0932   TRIG 68 12/01/2013 1734   HDL 26 (L) 07/11/2015 0932   HDL 85 (H) 12/01/2013 1734   CHOLHDL 11.5 (H)  07/11/2015 0932   VLDL 14 12/01/2013 1734   LDLCALC Comment 07/11/2015 0932   LDLCALC 153 (H) 12/01/2013 1734   He reports fair compliance with treatment. He is not having side effects.  Current symptoms include none and have been unchanged. Weight trend: stable  Wt Readings from Last 3 Encounters:  07/19/16 206 lb (93.4 kg)  07/11/15 219 lb (99.3 kg)  09/15/14 225 lb (102.1 kg)    ------------------------------------------------------------------- .  Pre diabetes- last A1c was 07/11/15 5.4  No changes were made. Is doing well with diet avoiding sweets .   Back pain- Pt takes hydrocodone- APAP 5/325 mg three times a daily at least. Pain is aggravated byby work. Does not usually need to take as much medication on his days off. No trouble with sleepiness or fatigue when he takes pain medication.   Continues to see Dr. Welton FlakesKhan for follow up mechanical valve and management of warfarin.       No Known Allergies   Current Outpatient Prescriptions:  .  ALPRAZolam (XANAX) 0.5 MG tablet, 1 Every six hours as needed., Disp: 60 tablet, Rfl: 5 .  amLODipine (NORVASC) 10 MG tablet, , Disp: , Rfl:  .  atorvastatin (LIPITOR) 80 MG tablet, TAKE 1 TABLET EVERY EVENING, Disp: 90 tablet, Rfl: 3 .  enalapril (VASOTEC) 20 MG tablet, Take 20 mg by mouth 2 (two) times daily. , Disp: , Rfl:  .  fexofenadine (ALLEGRA) 60 MG tablet, Take 60 mg by mouth 2 (two) times daily. , Disp: , Rfl:  .  furosemide (LASIX) 20 MG  tablet, Take 20 mg by mouth daily. , Disp: , Rfl:  .  HYDROcodone-acetaminophen (NORCO/VICODIN) 5-325 MG tablet, Take 1-2 tablets by mouth every 6 (six) hours as needed for moderate pain., Disp: 60 tablet, Rfl: 0 .  metoprolol (TOPROL-XL) 200 MG 24 hr tablet, TAKE 1 TABLET DAILY, Disp: 90 tablet, Rfl: 3 .  NON FORMULARY, , Disp: , Rfl:  .  omeprazole (PRILOSEC) 20 MG capsule, TAKE 1 CAPSULE DAILY, Disp: 90 capsule, Rfl: 4 .  Vitamin D, Ergocalciferol, (DRISDOL) 50000 units CAPS capsule, TAKE  1 CAPSULE ONCE WEEKLY, Disp: 12 capsule, Rfl: 4 .  warfarin (COUMADIN) 5 MG tablet, Take 5 mg by mouth daily. , Disp: , Rfl:  .  MULTIPLE VITAMIN PO, Take 1 tablet by mouth daily. , Disp: , Rfl:  .  omega-3 acid ethyl esters (LOVAZA) 1 g capsule, Take 1 capsule (1 g total) by mouth 2 (two) times daily. (Patient not taking: Reported on 07/19/2016), Disp: 120 capsule, Rfl: 3  Review of Systems  Constitutional: Negative.   HENT: Negative.   Eyes: Negative.   Respiratory: Negative.   Cardiovascular: Negative.   Gastrointestinal: Negative.   Endocrine: Negative.   Genitourinary: Negative.   Musculoskeletal: Positive for back pain.  Skin: Negative.   Allergic/Immunologic: Negative.   Neurological: Negative.   Hematological: Negative.   Psychiatric/Behavioral: Negative.     Social History  Substance Use Topics  . Smoking status: Never Smoker  . Smokeless tobacco: Current User    Types: Snuff, Chew     Comment: 2 cans per week  . Alcohol use No   Objective:   BP (!) 104/54 (BP Location: Left Arm, Patient Position: Sitting, Cuff Size: Normal)   Pulse 68   Temp 98 F (36.7 C) (Oral)   Resp 14   Wt 206 lb (93.4 kg)   SpO2 99%   BMI 27.94 kg/m   Physical Exam   General Appearance:    Alert, cooperative, no distress  Eyes:    PERRL, conjunctiva/corneas clear, EOM's intact       Lungs:     Clear to auscultation bilaterally, respirations unlabored  Heart:    Regular rate and rhythm. Mechanical click.   Neurologic:   Awake, alert, oriented x 3. No apparent focal neurological           defect.           Assessment & Plan:     1. Chronic, continuous use of opioids  - Pain Mgt Scrn (14 Drugs), Ur  2. Benign essential HTN Well controlled.  Continue current medications.    3. Sleep apnea, unspecified type Stable  4. Hyperlipidemia, mixed He is tolerating atorvastatin well with no adverse effects.   - Lipid panel - Comprehensive metabolic panel - TSH  5.  Prediabetes  - Hemoglobin A1c  6. Vitamin D deficiency  - VITAMIN D 25 Hydroxy (Vit-D Deficiency, Fractures)  7. Tobacco use disorder Stop smoking  8. Prostate cancer screening  - PSA     The entirety of the information documented in the History of Present Illness, Review of Systems and Physical Exam were personally obtained by me. Portions of this information were initially documented by [place] CMA and reviewed by me for thoroughness and accuracy.    Mila Merry, MD  [facility]

## 2016-07-20 LAB — LIPID PANEL
Chol/HDL Ratio: 9 ratio units — ABNORMAL HIGH (ref 0.0–5.0)
Cholesterol, Total: 324 mg/dL — ABNORMAL HIGH (ref 100–199)
HDL: 36 mg/dL — ABNORMAL LOW (ref >39)
Triglycerides: 705 mg/dL (ref 0–149)

## 2016-07-20 LAB — COMPREHENSIVE METABOLIC PANEL
ALBUMIN: 4.1 g/dL (ref 3.5–5.5)
ALK PHOS: 73 IU/L (ref 39–117)
ALT: 23 IU/L (ref 0–44)
AST: 28 IU/L (ref 0–40)
Albumin/Globulin Ratio: 1.6 (ref 1.2–2.2)
BUN / CREAT RATIO: 16 (ref 9–20)
BUN: 18 mg/dL (ref 6–24)
Bilirubin Total: 0.5 mg/dL (ref 0.0–1.2)
CO2: 26 mmol/L (ref 18–29)
Calcium: 9.2 mg/dL (ref 8.7–10.2)
Chloride: 95 mmol/L — ABNORMAL LOW (ref 96–106)
Creatinine, Ser: 1.15 mg/dL (ref 0.76–1.27)
GFR calc Af Amer: 81 mL/min/{1.73_m2} (ref >59)
GFR calc non Af Amer: 70 mL/min/{1.73_m2} (ref >59)
GLUCOSE: 105 mg/dL — AB (ref 65–99)
Globulin, Total: 2.5 g/dL (ref 1.5–4.5)
POTASSIUM: 5 mmol/L (ref 3.5–5.2)
SODIUM: 138 mmol/L (ref 134–144)
Total Protein: 6.6 g/dL (ref 6.0–8.5)

## 2016-07-20 LAB — PAIN MGT SCRN (14 DRUGS), UR
Amphetamine Screen, Ur: NEGATIVE ng/mL
BENZODIAZEPINE SCREEN, URINE: NEGATIVE ng/mL
BUPRENORPHINE, URINE: NEGATIVE ng/mL
Barbiturate Screen, Ur: NEGATIVE ng/mL
CREATININE(CRT), U: 34.5 mg/dL (ref 20.0–300.0)
Cannabinoids Ur Ql Scn: NEGATIVE ng/mL
Cocaine(Metab.)Screen, Urine: NEGATIVE ng/mL
FENTANYL, URINE: NEGATIVE pg/mL
MEPERIDINE SCREEN, URINE: NEGATIVE ng/mL
METHADONE SCREEN, URINE: NEGATIVE ng/mL
OPIATE SCRN UR: POSITIVE ng/mL
OXYCODONE+OXYMORPHONE UR QL SCN: NEGATIVE ng/mL
PCP SCRN UR: NEGATIVE ng/mL
PH UR, DRUG SCRN: 4.9 (ref 4.5–8.9)
Propoxyphene, Screen: NEGATIVE ng/mL
Tramadol Ur Ql Scn: NEGATIVE ng/mL

## 2016-07-20 LAB — HEMOGLOBIN A1C
ESTIMATED AVERAGE GLUCOSE: 114 mg/dL
Hgb A1c MFr Bld: 5.6 % (ref 4.8–5.6)

## 2016-07-20 LAB — PSA: PROSTATE SPECIFIC AG, SERUM: 0.6 ng/mL (ref 0.0–4.0)

## 2016-07-20 LAB — VITAMIN D 25 HYDROXY (VIT D DEFICIENCY, FRACTURES): VIT D 25 HYDROXY: 38.8 ng/mL (ref 30.0–100.0)

## 2016-07-20 LAB — TSH: TSH: 1.66 u[IU]/mL (ref 0.450–4.500)

## 2016-07-22 ENCOUNTER — Telehealth: Payer: Self-pay

## 2016-07-22 NOTE — Telephone Encounter (Signed)
-----   Message from Malva Limesonald E Fisher, MD sent at 07/20/2016  9:16 AM EST ----- Cholesterol is extremely high at 324 (should be under 180) and triglycerides are very high at 705. Need to d/c atorvastatin and start rosuvastatin 40mg  once a day, #30, rf x 3 and schedule follow up in 7-8 weeks.  Need to stop drinking alcohol, no sweets and avoid starchy foods such as white breads and pasta.  Rest of labs are normal.

## 2016-07-22 NOTE — Telephone Encounter (Signed)
Left message to call back  

## 2016-07-24 MED ORDER — ROSUVASTATIN CALCIUM 40 MG PO TABS: 40.0000 mg | ORAL_TABLET | Freq: Every day | ORAL | 0 refills | Status: DC

## 2016-07-24 NOTE — Telephone Encounter (Signed)
Patient was notified of results. Expressed understanding. Rx sent to pharmacy. 

## 2016-07-28 ENCOUNTER — Other Ambulatory Visit: Payer: Self-pay | Admitting: Family Medicine

## 2016-07-29 ENCOUNTER — Other Ambulatory Visit: Payer: Self-pay | Admitting: *Deleted

## 2016-07-30 MED ORDER — HYDROCODONE-ACETAMINOPHEN 5-325 MG PO TABS: 1.0000 | ORAL_TABLET | Freq: Four times a day (QID) | ORAL | 0 refills | Status: DC | PRN

## 2016-08-13 ENCOUNTER — Other Ambulatory Visit: Payer: Self-pay | Admitting: *Deleted

## 2016-08-13 MED ORDER — HYDROCODONE-ACETAMINOPHEN 5-325 MG PO TABS: 1.0000 | ORAL_TABLET | Freq: Four times a day (QID) | ORAL | 0 refills | Status: DC | PRN

## 2016-08-28 ENCOUNTER — Other Ambulatory Visit: Payer: Self-pay | Admitting: *Deleted

## 2016-08-28 MED ORDER — HYDROCODONE-ACETAMINOPHEN 5-325 MG PO TABS: 1.0000 | ORAL_TABLET | Freq: Four times a day (QID) | ORAL | 0 refills | Status: DC | PRN

## 2016-09-09 ENCOUNTER — Other Ambulatory Visit: Payer: Self-pay

## 2016-09-09 MED ORDER — HYDROCODONE-ACETAMINOPHEN 5-325 MG PO TABS: 1.0000 | ORAL_TABLET | Freq: Four times a day (QID) | ORAL | 0 refills | Status: DC | PRN

## 2016-09-09 NOTE — Telephone Encounter (Signed)
Patient sent email requesting refills.  

## 2016-09-12 ENCOUNTER — Telehealth: Payer: Self-pay | Admitting: Family Medicine

## 2016-09-12 DIAGNOSIS — E782 Mixed hyperlipidemia: Secondary | ICD-10-CM

## 2016-09-12 NOTE — Telephone Encounter (Signed)
Due for follow up cholesterol.

## 2016-09-24 ENCOUNTER — Other Ambulatory Visit: Payer: Self-pay | Admitting: *Deleted

## 2016-09-24 MED ORDER — HYDROCODONE-ACETAMINOPHEN 5-325 MG PO TABS: 1.0000 | ORAL_TABLET | Freq: Four times a day (QID) | ORAL | 0 refills | Status: DC | PRN

## 2016-09-25 ENCOUNTER — Telehealth: Payer: Self-pay | Admitting: Family Medicine

## 2016-09-25 MED ORDER — METOPROLOL SUCCINATE ER 200 MG PO TB24: 200.0000 mg | ORAL_TABLET | Freq: Every day | ORAL | 1 refills | Status: DC

## 2016-09-25 NOTE — Telephone Encounter (Signed)
Patient is requesting 30 day supply for metoprolol (TOPROL-XL) 200 MG 24 hr tablet to [facility].  He is in the process of switching to [employer] insurance and needs one month supply sent to his local pharmacy to last until his [employer] is active.

## 2016-10-03 ENCOUNTER — Other Ambulatory Visit: Payer: Self-pay | Admitting: Family Medicine

## 2016-10-08 ENCOUNTER — Other Ambulatory Visit: Payer: Self-pay | Admitting: *Deleted

## 2016-10-08 ENCOUNTER — Telehealth: Payer: Self-pay | Admitting: Family Medicine

## 2016-10-08 MED ORDER — HYDROCODONE-ACETAMINOPHEN 5-325 MG PO TABS: 1.0000 | ORAL_TABLET | Freq: Four times a day (QID) | ORAL | 0 refills | Status: DC | PRN

## 2016-10-08 NOTE — Telephone Encounter (Signed)
Please advise patient he is due for follow up cholesterol since cholesterol and triglycerides were so high in January. Please print up lab order from 09/12/2016 and leave for patient to pick up at front desk.

## 2016-10-08 NOTE — Telephone Encounter (Signed)
Advised, patient still has the lab slip  ED

## 2016-10-08 NOTE — Telephone Encounter (Signed)
-----   Message from Malva Limesonald E Fisher, MD sent at 09/12/2016  4:13 PM EST ----- Regarding: FW: make sure follows up for cholesterol in march Given lab order with prescription 09-12-16, make sure he did labs before refill rx.  ----- Message ----- From: Malva Limesonald E Fisher, MD Sent: 09/12/2016 To: Malva Limesonald E Fisher, MD Subject: make sure follows up for cholesterol in march

## 2016-10-21 ENCOUNTER — Other Ambulatory Visit: Payer: Self-pay | Admitting: *Deleted

## 2016-10-21 NOTE — Telephone Encounter (Signed)
Labs were ordered 09-12-2016 but not done yet. Need labs done before we can approve prescription refills.

## 2016-10-23 NOTE — Telephone Encounter (Signed)
Patient advised as below. He states he will try to get labs done either today or tomorrow. He states the reason he hasn't done his labs was because there is always a long wait and he has to take time off to go get them done.

## 2016-10-24 DIAGNOSIS — E782 Mixed hyperlipidemia: Secondary | ICD-10-CM | POA: Diagnosis not present

## 2016-10-24 DIAGNOSIS — I350 Nonrheumatic aortic (valve) stenosis: Secondary | ICD-10-CM | POA: Diagnosis not present

## 2016-10-25 ENCOUNTER — Telehealth: Payer: Self-pay | Admitting: Family Medicine

## 2016-10-25 LAB — LIPID PANEL
CHOL/HDL RATIO: 8 ratio — AB (ref 0.0–5.0)
Cholesterol, Total: 288 mg/dL — ABNORMAL HIGH (ref 100–199)
HDL: 36 mg/dL — AB (ref >39)
TRIGLYCERIDES: 817 mg/dL — AB (ref 0–149)

## 2016-10-25 LAB — HEPATIC FUNCTION PANEL
ALK PHOS: 149 IU/L — AB (ref 39–117)
ALT: 248 IU/L — AB (ref 0–44)
AST: 419 IU/L — ABNORMAL HIGH (ref 0–40)
Albumin: 4.4 g/dL (ref 3.5–5.5)
BILIRUBIN, DIRECT: 0.22 mg/dL (ref 0.00–0.40)
Bilirubin Total: 0.5 mg/dL (ref 0.0–1.2)
Total Protein: 6.5 g/dL (ref 6.0–8.5)

## 2016-10-25 MED ORDER — HYDROCODONE-ACETAMINOPHEN 5-325 MG PO TABS: 1.0000 | ORAL_TABLET | Freq: Four times a day (QID) | ORAL | 0 refills | Status: DC | PRN

## 2016-10-25 NOTE — Telephone Encounter (Signed)
   Stop Crestor for 2 Weeks, need to recheck liver functions in 2 weeks, if not better will need to get ultrasound of liver.    Please add hepatitic C antibody, hepatitis A IgM, and hepatitis B core antibody to labs drawn earlier this week.

## 2016-10-25 NOTE — Telephone Encounter (Signed)
-----   Message from Malva Limes, MD sent at 10/25/2016  1:24 PM EDT ----- Patient has very high cholesterol and triglycerides, and very high liver functions. Please check to see if he is taking Crestor every day. Also if he is drinking alcohol. Will likely need to change medications.

## 2016-10-25 NOTE — Telephone Encounter (Signed)
LMTCB  10/25/2016    Thanks,   -Vernona Rieger

## 2016-10-25 NOTE — Telephone Encounter (Signed)
Patient was notified of results.  

## 2016-10-25 NOTE — Telephone Encounter (Signed)
Pt called wanting to know if we got his lab results so he can get his meds.  Thank sTeri

## 2016-10-25 NOTE — Telephone Encounter (Signed)
Patient stated that he is taking Crestor qd. Patient also stated that he drinks some alcohol but not a lot.

## 2016-10-25 NOTE — Telephone Encounter (Signed)
Please review

## 2016-10-29 ENCOUNTER — Other Ambulatory Visit: Payer: Self-pay | Admitting: Family Medicine

## 2016-10-29 DIAGNOSIS — F419 Anxiety disorder, unspecified: Secondary | ICD-10-CM

## 2016-10-30 NOTE — Telephone Encounter (Signed)
Rx called in to pharmacy. 

## 2016-10-30 NOTE — Telephone Encounter (Signed)
Please call in alprazolam.  

## 2016-10-31 ENCOUNTER — Other Ambulatory Visit: Payer: Self-pay

## 2016-10-31 DIAGNOSIS — R748 Abnormal levels of other serum enzymes: Secondary | ICD-10-CM

## 2016-10-31 NOTE — Telephone Encounter (Signed)
Patient has been advised and lab orders have been completed ED

## 2016-11-04 ENCOUNTER — Other Ambulatory Visit: Payer: Self-pay | Admitting: *Deleted

## 2016-11-04 MED ORDER — HYDROCODONE-ACETAMINOPHEN 5-325 MG PO TABS: 1.0000 | ORAL_TABLET | Freq: Four times a day (QID) | ORAL | 0 refills | Status: DC | PRN

## 2016-11-08 DIAGNOSIS — R748 Abnormal levels of other serum enzymes: Secondary | ICD-10-CM | POA: Diagnosis not present

## 2016-11-09 LAB — HEPATITIS A ANTIBODY, IGM: HEP A IGM: NEGATIVE

## 2016-11-09 LAB — HEPATIC FUNCTION PANEL
ALBUMIN: 3.8 g/dL (ref 3.5–5.5)
ALT: 53 IU/L — ABNORMAL HIGH (ref 0–44)
AST: 54 IU/L — ABNORMAL HIGH (ref 0–40)
Alkaline Phosphatase: 124 IU/L — ABNORMAL HIGH (ref 39–117)
Bilirubin Total: 0.4 mg/dL (ref 0.0–1.2)
Bilirubin, Direct: 0.35 mg/dL (ref 0.00–0.40)
TOTAL PROTEIN: 6.5 g/dL (ref 6.0–8.5)

## 2016-11-09 LAB — HEPATITIS C ANTIBODY: Hep C Virus Ab: <0.1 s/co ratio (ref 0.0–0.9)

## 2016-11-09 LAB — HEPATITIS B CORE ANTIBODY, IGM: Hep B C IgM: NEGATIVE

## 2016-11-11 ENCOUNTER — Telehealth: Payer: Self-pay | Admitting: *Deleted

## 2016-11-11 DIAGNOSIS — E782 Mixed hyperlipidemia: Secondary | ICD-10-CM

## 2016-11-11 MED ORDER — PRAVASTATIN SODIUM 40 MG PO TABS: 40.0000 mg | ORAL_TABLET | Freq: Every day | ORAL | 0 refills | Status: DC

## 2016-11-11 NOTE — Telephone Encounter (Signed)
-----   Message from Malva Limes, MD sent at 11/09/2016  9:54 PM EDT ----- Liver functions are much better. Need to change to different cholesterol medication. Change to pravastatin  once a day, #30, rf x 1. Check cholesterol and liver functions in one month.

## 2016-11-11 NOTE — Telephone Encounter (Signed)
Patient was notified of results. Expressed understanding. Rx sent to pharmacy. 

## 2016-11-15 ENCOUNTER — Other Ambulatory Visit: Payer: Self-pay

## 2016-11-15 NOTE — Telephone Encounter (Signed)
Patient sent email requesting refills. Thanks!  

## 2016-11-18 MED ORDER — HYDROCODONE-ACETAMINOPHEN 5-325 MG PO TABS: 1.0000 | ORAL_TABLET | Freq: Four times a day (QID) | ORAL | 0 refills | Status: DC | PRN

## 2016-11-29 ENCOUNTER — Other Ambulatory Visit: Payer: Self-pay | Admitting: *Deleted

## 2016-11-29 MED ORDER — HYDROCODONE-ACETAMINOPHEN 5-325 MG PO TABS: 1.0000 | ORAL_TABLET | Freq: Four times a day (QID) | ORAL | 0 refills | Status: DC | PRN

## 2016-12-13 ENCOUNTER — Other Ambulatory Visit: Payer: Self-pay | Admitting: Family Medicine

## 2016-12-13 NOTE — Telephone Encounter (Signed)
Last fill 11/29/16-aa

## 2016-12-13 NOTE — Telephone Encounter (Signed)
Pt contacted office via [facility] website for refill request on the following medications: HYDROcodone-acetaminophen (NORCO/VICODIN) 5-325 MG tablet  Last Rx: 11/29/16 LOV: 07/19/16 Please advise. Thanks TNP

## 2016-12-16 MED ORDER — HYDROCODONE-ACETAMINOPHEN 5-325 MG PO TABS: 1.0000 | ORAL_TABLET | Freq: Four times a day (QID) | ORAL | 0 refills | Status: DC | PRN

## 2016-12-23 ENCOUNTER — Encounter: Payer: Self-pay | Admitting: Family Medicine

## 2016-12-23 ENCOUNTER — Ambulatory Visit (INDEPENDENT_AMBULATORY_CARE_PROVIDER_SITE_OTHER): Payer: BLUE CROSS/BLUE SHIELD | Admitting: Family Medicine

## 2016-12-23 VITALS — BP 100/50 | HR 82 | Temp 98.1°F | Resp 16 | Ht 72.0 in | Wt 217.0 lb

## 2016-12-23 DIAGNOSIS — E782 Mixed hyperlipidemia: Secondary | ICD-10-CM | POA: Diagnosis not present

## 2016-12-23 DIAGNOSIS — R945 Abnormal results of liver function studies: Secondary | ICD-10-CM

## 2016-12-23 DIAGNOSIS — R7989 Other specified abnormal findings of blood chemistry: Secondary | ICD-10-CM

## 2016-12-23 DIAGNOSIS — I10 Essential (primary) hypertension: Secondary | ICD-10-CM

## 2016-12-23 NOTE — Progress Notes (Signed)
Patient: Isaac Hancock Male    DOB: 10/30/1955   61 y.o.   MRN: [id number] Visit Date: 12/23/2016  Today's Provider: Mila Merryonald Fisher, MD   Chief Complaint  Patient presents with  . Follow-up  . Hypertension  . Hyperlipidemia   Subjective:    HPI   Hypertension, follow-up:  BP Readings from Last 3 Encounters:  12/23/16 (!) 100/50  07/19/16 (!) 104/54  07/11/15 (!) 92/50    He was last seen for hypertension 5 months ago.  BP at that visit was 104/54. Management since that visit includes; none changes.He reports good compliance with treatment. He is not having side effects. none He is exercising. He is adherent to low salt diet.   Outside blood pressures are not checking. He is experiencing none.  Patient denies none.   Cardiovascular risk factors include none.  Use of agents associated with hypertension: none.   ----------------------------------------------------------------    Lipid/Cholesterol, Follow-up:   Last seen for this 5 months ago.  Management since that visit includes; on 11/11/2016 changed to pravastatin 40 mg qd due to elevated liver functions.   Last Lipid Panel:    Component Value Date/Time   CHOL 288 (H) 10/24/2016 0824   CHOL 252 (H) 12/01/2013 1734   TRIG 817 (HH) 10/24/2016 0824   TRIG 68 12/01/2013 1734   HDL 36 (L) 10/24/2016 0824   HDL 85 (H) 12/01/2013 1734   CHOLHDL 8.0 (H) 10/24/2016 0824   VLDL 14 12/01/2013 1734   LDLCALC Comment 10/24/2016 0824   LDLCALC 153 (H) 12/01/2013 1734    He reports good compliance with treatment. He is not having side effects. none  Wt Readings from Last 3 Encounters:  12/23/16 217 lb (98.4 kg)  07/19/16 206 lb (93.4 kg)  07/11/15 219 lb (99.3 kg)    ----------------------------------------------------------------   Vitamin D deficiency Lab Results  Component Value Date   VD25OH 38.8 07/19/2016      No Known Allergies   Current Outpatient Prescriptions:  .  ALPRAZolam  (XANAX) 0.5 MG tablet, TAKE 1 TABLET BY MOUTH EVERY 6 HOURS AS NEEDED, Disp: 60 tablet, Rfl: 5 .  amLODipine (NORVASC) 10 MG tablet, , Disp: , Rfl:  .  enalapril (VASOTEC) 20 MG tablet, Take 20 mg by mouth 2 (two) times daily. , Disp: , Rfl:  .  fexofenadine (ALLEGRA) 60 MG tablet, Take 60 mg by mouth 2 (two) times daily. , Disp: , Rfl:  .  furosemide (LASIX) 20 MG tablet, Take 20 mg by mouth daily. , Disp: , Rfl:  .  HYDROcodone-acetaminophen (NORCO/VICODIN) 5-325 MG tablet, Take 1-2 tablets by mouth every 6 (six) hours as needed for moderate pain., Disp: 60 tablet, Rfl: 0 .  metoprolol (TOPROL-XL) 200 MG 24 hr tablet, Take 1 tablet (200 mg total) by mouth daily., Disp: 30 tablet, Rfl: 1 .  MULTIPLE VITAMIN PO, Take 1 tablet by mouth daily. , Disp: , Rfl:  .  NON FORMULARY, , Disp: , Rfl:  .  omega-3 acid ethyl esters (LOVAZA) 1 g capsule, TAKE 1 CAPSULE TWICE A DAY, Disp: 120 capsule, Rfl: 5 .  omeprazole (PRILOSEC) 20 MG capsule, TAKE 1 CAPSULE DAILY, Disp: 90 capsule, Rfl: 4 .  pravastatin (PRAVACHOL) 40 MG tablet, Take 1 tablet (40 mg total) by mouth daily., Disp: 90 tablet, Rfl: 0 .  Vitamin D, Ergocalciferol, (DRISDOL) 50000 units CAPS capsule, TAKE 1 CAPSULE ONCE WEEKLY, Disp: 12 capsule, Rfl: 4 .  warfarin (COUMADIN) 5 MG  tablet, Take 5 mg by mouth daily. , Disp: , Rfl:   Review of Systems  Constitutional: Negative for appetite change, chills and fever.  Respiratory: Negative for chest tightness, shortness of breath and wheezing.   Cardiovascular: Negative for chest pain and palpitations.  Gastrointestinal: Negative for abdominal pain, nausea and vomiting.    Social History  Substance Use Topics  . Smoking status: Never Smoker  . Smokeless tobacco: Current User    Types: Snuff, Chew     Comment: 2 cans per week  . Alcohol use No   Objective:   BP (!) 100/50 (BP Location: Right Arm, Patient Position: Sitting, Cuff Size: Large)   Pulse 82   Temp 98.1 F (36.7 C) (Oral)    Resp 16   Ht 6' (1.829 m)   Wt 217 lb (98.4 kg)   SpO2 99%   BMI 29.43 kg/m  Vitals:   12/23/16 1625  BP: (!) 100/50  Pulse: 82  Resp: 16  Temp: 98.1 F (36.7 C)  TempSrc: Oral  SpO2: 99%  Weight: 217 lb (98.4 kg)  Height: 6' (1.829 m)   Depression screen [facility] 2/9 12/23/2016  Decreased Interest 0  Down, Depressed, Hopeless 0  PHQ - 2 Score 0  Altered sleeping 1  Tired, decreased energy 1  Change in appetite 0  Feeling bad or failure about yourself  0  Trouble concentrating 0  Moving slowly or fidgety/restless 0  Suicidal thoughts 0  PHQ-9 Score 2  Difficult doing work/chores Not difficult at all     Physical Exam   General Appearance:    Alert, cooperative, no distress  Eyes:    PERRL, conjunctiva/corneas clear, EOM's intact       Lungs:     Clear to auscultation bilaterally, respirations unlabored  Heart:    Regular rate and rhythm  Neurologic:   Awake, alert, oriented x 3. No apparent focal neurological           defect.           Assessment & Plan:     .1. Hyperlipidemia, mixed Tolerating change from Crestor to pravastatin due to elevated LFTs.  - Lipid panel - Comprehensive metabolic panel  2. Benign essential HTN Well controlled.  .Medications managed by Dr. Welton Flakes.   3. Liver function test abnormality Recheck today. Negative hepatitis titers.        Mila Merry, MD  [facility]

## 2016-12-30 DIAGNOSIS — Z952 Presence of prosthetic heart valve: Secondary | ICD-10-CM | POA: Diagnosis not present

## 2016-12-30 DIAGNOSIS — E785 Hyperlipidemia, unspecified: Secondary | ICD-10-CM | POA: Diagnosis not present

## 2016-12-30 LAB — HEPATIC FUNCTION PANEL
ALK PHOS: 68 (ref 25–125)
ALT: 17 (ref 10–40)
AST: 27 (ref 14–40)

## 2016-12-30 LAB — LIPID PANEL
Cholesterol: 255 — AB (ref 0–200)
HDL: 47 (ref 35–70)
LDL Cholesterol: 165
TRIGLYCERIDES: 213 — AB (ref 40–160)

## 2016-12-30 LAB — BASIC METABOLIC PANEL
BUN: 14 (ref 4–21)
CREATININE: 1.1 (ref 0.6–1.3)
Glucose: 93
Potassium: 4.3 (ref 3.4–5.3)
SODIUM: 137 (ref 137–147)

## 2017-01-01 ENCOUNTER — Other Ambulatory Visit: Payer: Self-pay | Admitting: *Deleted

## 2017-01-01 ENCOUNTER — Other Ambulatory Visit: Payer: Self-pay | Admitting: Family Medicine

## 2017-01-01 MED ORDER — HYDROCODONE-ACETAMINOPHEN 5-325 MG PO TABS: 1.0000 | ORAL_TABLET | Freq: Four times a day (QID) | ORAL | 0 refills | Status: DC | PRN

## 2017-01-07 ENCOUNTER — Other Ambulatory Visit: Payer: Self-pay | Admitting: Family Medicine

## 2017-01-09 ENCOUNTER — Telehealth: Payer: Self-pay

## 2017-01-10 NOTE — Telephone Encounter (Signed)
Patient not seen.

## 2017-01-12 ENCOUNTER — Other Ambulatory Visit: Payer: Self-pay | Admitting: Family Medicine

## 2017-01-13 DIAGNOSIS — I350 Nonrheumatic aortic (valve) stenosis: Secondary | ICD-10-CM | POA: Diagnosis not present

## 2017-01-13 MED ORDER — HYDROCODONE-ACETAMINOPHEN 5-325 MG PO TABS: 1.0000 | ORAL_TABLET | Freq: Four times a day (QID) | ORAL | 0 refills | Status: DC | PRN

## 2017-01-23 ENCOUNTER — Other Ambulatory Visit: Payer: Self-pay | Admitting: Family Medicine

## 2017-01-24 NOTE — Telephone Encounter (Signed)
Last fill was 01/13/17-aa 

## 2017-01-26 NOTE — Telephone Encounter (Signed)
Please advise patient that we haven't gotten results of labs ordered in June. Need these results before we approve prescription refill.

## 2017-01-27 ENCOUNTER — Other Ambulatory Visit: Payer: Self-pay | Admitting: Family Medicine

## 2017-01-28 ENCOUNTER — Other Ambulatory Visit: Payer: Self-pay | Admitting: Family Medicine

## 2017-01-28 NOTE — Telephone Encounter (Signed)
Pt called to verify that we recd his my chart request for his hydrocodone.  Please call when ready to pick up [phone]  Thanks teri

## 2017-01-29 ENCOUNTER — Other Ambulatory Visit: Payer: Self-pay | Admitting: Family Medicine

## 2017-01-31 MED ORDER — PRAVASTATIN SODIUM 80 MG PO TABS: 80.0000 mg | ORAL_TABLET | Freq: Every day | ORAL | 0 refills | Status: DC

## 2017-01-31 MED ORDER — HYDROCODONE-ACETAMINOPHEN 5-325 MG PO TABS: 1.0000 | ORAL_TABLET | Freq: Four times a day (QID) | ORAL | 0 refills | Status: DC | PRN

## 2017-01-31 NOTE — Telephone Encounter (Signed)
Patient was notified of results. Rx sent to pharmacy. 

## 2017-01-31 NOTE — Telephone Encounter (Signed)
Please advise patient cholesterol has improved from 288 to 255 and liver functions are normal. need to get cholesterol <200. increase pravastatin to 80 mg daily, #30, rf x 3. check lipids and liver functions in 3 months.

## 2017-01-31 NOTE — Telephone Encounter (Signed)
Please see below-aa 

## 2017-01-31 NOTE — Telephone Encounter (Signed)
See other refill message.

## 2017-01-31 NOTE — Telephone Encounter (Signed)
Pt had these labs done at his cardiologist at [facility]. Called [facility], and they are faxing over the results. Mr. Isaac Hancock aware that Dr. Sherrie MustacheFisher needs to review these labs before he will refill medication.

## 2017-02-04 ENCOUNTER — Encounter: Payer: Self-pay | Admitting: *Deleted

## 2017-02-09 ENCOUNTER — Other Ambulatory Visit: Payer: Self-pay | Admitting: Family Medicine

## 2017-02-10 ENCOUNTER — Other Ambulatory Visit: Payer: Self-pay

## 2017-02-10 MED ORDER — HYDROCODONE-ACETAMINOPHEN 5-325 MG PO TABS: 1.0000 | ORAL_TABLET | Freq: Four times a day (QID) | ORAL | 0 refills | Status: DC | PRN

## 2017-02-10 NOTE — Telephone Encounter (Signed)
Last fill was 01/31/17-aa 

## 2017-02-22 ENCOUNTER — Other Ambulatory Visit: Payer: Self-pay | Admitting: Family Medicine

## 2017-02-26 ENCOUNTER — Other Ambulatory Visit: Payer: Self-pay | Admitting: Family Medicine

## 2017-02-27 ENCOUNTER — Encounter: Payer: Self-pay | Admitting: Family Medicine

## 2017-02-27 MED ORDER — HYDROCODONE-ACETAMINOPHEN 5-325 MG PO TABS: 1.0000 | ORAL_TABLET | Freq: Four times a day (QID) | ORAL | 0 refills | Status: DC | PRN

## 2017-02-28 DIAGNOSIS — I350 Nonrheumatic aortic (valve) stenosis: Secondary | ICD-10-CM | POA: Diagnosis not present

## 2017-03-08 ENCOUNTER — Other Ambulatory Visit: Payer: Self-pay | Admitting: Family Medicine

## 2017-03-10 MED ORDER — HYDROCODONE-ACETAMINOPHEN 5-325 MG PO TABS: 1.0000 | ORAL_TABLET | Freq: Four times a day (QID) | ORAL | 0 refills | Status: DC | PRN

## 2017-03-21 ENCOUNTER — Encounter: Payer: Self-pay | Admitting: Family Medicine

## 2017-03-24 ENCOUNTER — Other Ambulatory Visit: Payer: Self-pay | Admitting: Family Medicine

## 2017-03-25 MED ORDER — HYDROCODONE-ACETAMINOPHEN 5-325 MG PO TABS: 1.0000 | ORAL_TABLET | Freq: Four times a day (QID) | ORAL | 0 refills | Status: DC | PRN

## 2017-04-07 ENCOUNTER — Other Ambulatory Visit: Payer: Self-pay | Admitting: Family Medicine

## 2017-04-07 MED ORDER — HYDROCODONE-ACETAMINOPHEN 5-325 MG PO TABS: 1.0000 | ORAL_TABLET | Freq: Four times a day (QID) | ORAL | 0 refills | Status: DC | PRN

## 2017-04-14 ENCOUNTER — Telehealth: Payer: Self-pay | Admitting: Family Medicine

## 2017-04-14 ENCOUNTER — Other Ambulatory Visit: Payer: Self-pay

## 2017-04-14 DIAGNOSIS — E782 Mixed hyperlipidemia: Secondary | ICD-10-CM

## 2017-04-14 NOTE — Telephone Encounter (Signed)
-----   Message from Malva Limes, MD sent at 01/31/2017  4:23 PM EDT ----- Regarding: check lipids and liver functions in October Increased pravastatin to  July 20th.

## 2017-04-14 NOTE — Telephone Encounter (Signed)
Advised patient as below. Orders for labs printed.

## 2017-04-14 NOTE — Telephone Encounter (Signed)
Patient needs lipid panel and hepatic panel since increasing pravastatin in June. Please advise and enter labs order. Thanks!

## 2017-04-16 DIAGNOSIS — E782 Mixed hyperlipidemia: Secondary | ICD-10-CM | POA: Diagnosis not present

## 2017-04-16 LAB — HEPATIC FUNCTION PANEL
AG Ratio: 1.7 (calc) (ref 1.0–2.5)
ALBUMIN MSPROF: 4.1 g/dL (ref 3.6–5.1)
ALKALINE PHOSPHATASE (APISO): 77 U/L (ref 40–115)
ALT: 21 U/L (ref 9–46)
AST: 42 U/L — AB (ref 10–35)
BILIRUBIN TOTAL: 0.6 mg/dL (ref 0.2–1.2)
Bilirubin, Direct: 0.1 mg/dL (ref 0.0–0.2)
Globulin: 2.4 g/dL (calc) (ref 1.9–3.7)
Indirect Bilirubin: 0.5 mg/dL (calc) (ref 0.2–1.2)
TOTAL PROTEIN: 6.5 g/dL (ref 6.1–8.1)

## 2017-04-16 LAB — LIPID PANEL
CHOL/HDL RATIO: 11.3 (calc) — AB (ref <5.0)
Cholesterol: 363 mg/dL — ABNORMAL HIGH (ref <200)
HDL: 32 mg/dL — ABNORMAL LOW (ref >40)
NON-HDL CHOLESTEROL (CALC): 331 mg/dL — AB (ref <130)
Triglycerides: 1184 mg/dL — ABNORMAL HIGH (ref <150)

## 2017-04-17 ENCOUNTER — Telehealth: Payer: Self-pay

## 2017-04-17 ENCOUNTER — Other Ambulatory Visit: Payer: Self-pay | Admitting: Family Medicine

## 2017-04-17 MED ORDER — HYDROCODONE-ACETAMINOPHEN 5-325 MG PO TABS: 1.0000 | ORAL_TABLET | Freq: Four times a day (QID) | ORAL | 0 refills | Status: DC | PRN

## 2017-04-17 NOTE — Telephone Encounter (Signed)
-----   Message from Malva Limes, MD sent at 04/17/2017  8:02 AM EDT ----- Cholesterol is very high at 363 and triglycerides are extremely high at 1,184. Need to change from pravastatin to atorvastatin  once a day, #30, rf x 1. Should also take a b-complex vitamin daily, and coenzyme Q10  daily to reduce chance of muscle pains.   Need to schedule follow up in 1 month.

## 2017-04-17 NOTE — Telephone Encounter (Signed)
[id number] 

## 2017-04-17 NOTE — Telephone Encounter (Signed)
Pt is returning call.  CB#[phone]

## 2017-04-18 MED ORDER — ATORVASTATIN CALCIUM 40 MG PO TABS: 40.0000 mg | ORAL_TABLET | Freq: Every day | ORAL | 0 refills | Status: DC

## 2017-04-18 NOTE — Telephone Encounter (Signed)
Patient was notified of results. Rx was sent to pharmacy. Patient schedule appt for 05/29/2017.

## 2017-05-01 ENCOUNTER — Other Ambulatory Visit: Payer: Self-pay | Admitting: Family Medicine

## 2017-05-01 MED ORDER — HYDROCODONE-ACETAMINOPHEN 5-325 MG PO TABS: 1.0000 | ORAL_TABLET | Freq: Four times a day (QID) | ORAL | 0 refills | Status: DC | PRN

## 2017-05-15 ENCOUNTER — Other Ambulatory Visit: Payer: Self-pay | Admitting: Family Medicine

## 2017-05-15 MED ORDER — HYDROCODONE-ACETAMINOPHEN 5-325 MG PO TABS: 1.0000 | ORAL_TABLET | Freq: Four times a day (QID) | ORAL | 0 refills | Status: DC | PRN

## 2017-05-27 ENCOUNTER — Other Ambulatory Visit: Payer: Self-pay | Admitting: Family Medicine

## 2017-05-27 MED ORDER — HYDROCODONE-ACETAMINOPHEN 5-325 MG PO TABS: 1.0000 | ORAL_TABLET | Freq: Four times a day (QID) | ORAL | 0 refills | Status: DC | PRN

## 2017-05-29 ENCOUNTER — Encounter: Payer: Self-pay | Admitting: Family Medicine

## 2017-05-29 ENCOUNTER — Ambulatory Visit: Payer: BLUE CROSS/BLUE SHIELD | Admitting: Family Medicine

## 2017-05-29 VITALS — BP 110/60 | HR 65 | Temp 97.9°F | Resp 18 | Wt 213.0 lb

## 2017-05-29 DIAGNOSIS — E782 Mixed hyperlipidemia: Secondary | ICD-10-CM | POA: Diagnosis not present

## 2017-05-29 LAB — LIPID PANEL
Cholesterol: 276 mg/dL — ABNORMAL HIGH (ref <200)
HDL: 39 mg/dL — ABNORMAL LOW (ref >40)
NON-HDL CHOLESTEROL (CALC): 237 mg/dL — AB (ref <130)
TRIGLYCERIDES: 838 mg/dL — AB (ref <150)
Total CHOL/HDL Ratio: 7.1 (calc) — ABNORMAL HIGH (ref <5.0)

## 2017-05-29 LAB — COMPLETE METABOLIC PANEL WITH GFR
AG Ratio: 2 (calc) (ref 1.0–2.5)
ALBUMIN MSPROF: 4.2 g/dL (ref 3.6–5.1)
ALKALINE PHOSPHATASE (APISO): 83 U/L (ref 40–115)
ALT: 56 U/L — AB (ref 9–46)
AST: 92 U/L — ABNORMAL HIGH (ref 10–35)
BILIRUBIN TOTAL: 0.8 mg/dL (ref 0.2–1.2)
BUN: 13 mg/dL (ref 7–25)
CHLORIDE: 100 mmol/L (ref 98–110)
CO2: 30 mmol/L (ref 20–32)
Calcium: 8.8 mg/dL (ref 8.6–10.3)
Creat: 1.12 mg/dL (ref 0.70–1.33)
GFR, Est African American: 83 mL/min/{1.73_m2} (ref >=60)
GFR, Est Non African American: 72 mL/min/{1.73_m2} (ref >=60)
GLUCOSE: 121 mg/dL — AB (ref 65–99)
Globulin: 2.1 g/dL (calc) (ref 1.9–3.7)
POTASSIUM: 4.3 mmol/L (ref 3.5–5.3)
Sodium: 138 mmol/L (ref 135–146)
Total Protein: 6.3 g/dL (ref 6.1–8.1)

## 2017-05-29 NOTE — Patient Instructions (Signed)

## 2017-05-29 NOTE — Progress Notes (Signed)
Patient: Maury DusCharles K Morris Male    DOB: 06-04-1956   61 y.o.   MRN: [id number] Visit Date: 05/29/2017  Today's Provider: Mila Merryonald Fisher, MD   Chief Complaint  Patient presents with  . Hyperlipidemia    1 month follow up   Subjective:    HPI  Lipid/Cholesterol, Follow-up:   Last seen for this 1 months ago.  Management changes since that visit include changing Pravastatin to Atorvastatin 40mg  daily. Patient was advised to take B-Complex daily to reduce muscle pain. . Last Lipid Panel:    Component Value Date/Time   CHOL 363 (H) 04/16/2017 0820   CHOL 288 (H) 10/24/2016 0824   CHOL 252 (H) 12/01/2013 1734   TRIG 1,184 (H) 04/16/2017 0820   TRIG 68 12/01/2013 1734   HDL 32 (L) 04/16/2017 0820   HDL 36 (L) 10/24/2016 0824   HDL 85 (H) 12/01/2013 1734   CHOLHDL 11.3 (H) 04/16/2017 0820   VLDL 14 12/01/2013 1734   LDLCALC 165 12/30/2016   LDLCALC Comment 10/24/2016 0824   LDLCALC 153 (H) 12/01/2013 1734    Risk factors for vascular disease include hypercholesterolemia  He reports good compliance with treatment. Although patient forgot to start taking Vitamin B-Complex, he reports that he has not had any muscle pain. He is not having side effects.  Current symptoms include none and have been stable. Weight trend: decreasing steadily Prior visit with dietician: no Current diet: in general, an "unhealthy" diet Current exercise: none  Wt Readings from Last 3 Encounters:  12/23/16 217 lb (98.4 kg)  07/19/16 206 lb (93.4 kg)  07/11/15 219 lb (99.3 kg)    -------------------------------------------------------------------     Allergies  Allergen Reactions  . Crestor [Rosuvastatin Calcium]     High LFTs     Current Outpatient Medications:  .  ALPRAZolam (XANAX) 0.5 MG tablet, TAKE 1 TABLET BY MOUTH EVERY 6 HOURS AS NEEDED, Disp: 60 tablet, Rfl: 5 .  amLODipine (NORVASC) 10 MG tablet, , Disp: , Rfl:  .  atorvastatin (LIPITOR) 40 MG tablet, Take 1 tablet (40 mg  total) by mouth daily., Disp: 90 tablet, Rfl: 0 .  enalapril (VASOTEC) 20 MG tablet, Take 20 mg by mouth 2 (two) times daily. , Disp: , Rfl:  .  fexofenadine (ALLEGRA) 60 MG tablet, Take 60 mg by mouth 2 (two) times daily. , Disp: , Rfl:  .  furosemide (LASIX) 20 MG tablet, Take 20 mg by mouth daily. , Disp: , Rfl:  .  HYDROcodone-acetaminophen (NORCO/VICODIN) 5-325 MG tablet, Take 1-2 tablets every 6 (six) hours as needed by mouth for moderate pain., Disp: 60 tablet, Rfl: 0 .  metoprolol (TOPROL-XL) 200 MG 24 hr tablet, TAKE 1 TABLET DAILY, Disp: 90 tablet, Rfl: 3 .  MULTIPLE VITAMIN PO, Take 1 tablet by mouth daily. , Disp: , Rfl:  .  NON FORMULARY, , Disp: , Rfl:  .  omega-3 acid ethyl esters (LOVAZA) 1 g capsule, TAKE 1 CAPSULE TWICE A DAY, Disp: 120 capsule, Rfl: 5 .  omeprazole (PRILOSEC) 20 MG capsule, TAKE 1 CAPSULE DAILY, Disp: 90 capsule, Rfl: 4 .  Vitamin D, Ergocalciferol, (DRISDOL) 50000 units CAPS capsule, TAKE 1 CAPSULE ONCE WEEKLY, Disp: 12 capsule, Rfl: 4 .  warfarin (COUMADIN) 6 MG tablet, Take 6 mg daily by mouth., Disp: , Rfl:   Review of Systems  Constitutional: Negative for appetite change, chills and fever.  Respiratory: Negative for chest tightness, shortness of breath and wheezing.   Cardiovascular:  Negative for chest pain and palpitations.  Gastrointestinal: Negative for abdominal pain, nausea and vomiting.    Social History   Tobacco Use  . Smoking status: Never Smoker  . Smokeless tobacco: Current User    Types: Snuff, Chew  . Tobacco comment: 2 cans per week  Substance Use Topics  . Alcohol use: No    Alcohol/week: 0.0 oz   Objective:   BP 110/60 (BP Location: Left Arm, Patient Position: Sitting, Cuff Size: Large)   Pulse 65   Temp 97.9 F (36.6 C) (Oral)   Resp 18   Wt 213 lb (96.6 kg)   SpO2 98% Comment: room air  BMI 28.89 kg/m  There were no vitals filed for this visit.   Physical Exam  General appearance: alert, well developed, well  nourished, cooperative and in no distress Head: Normocephalic, without obvious abnormality, atraumatic Respiratory: Respirations even and unlabored, normal respiratory rate Extremities: No gross deformities  Assessment & Plan:     1. Hyperlipidemia, mixed Discussed diet changes to lower triglycerides. Tolerating atorvastatin well.  - Lipid panel - COMPLETE METABOLIC PANEL WITH GFR       Donald Fisher, MD  [facility]

## 2017-05-30 ENCOUNTER — Telehealth: Payer: Self-pay | Admitting: Family Medicine

## 2017-05-30 NOTE — Telephone Encounter (Signed)
Pt returned Ana's call about lab results. Please advise. Thanks TNP

## 2017-06-06 ENCOUNTER — Other Ambulatory Visit: Payer: Self-pay | Admitting: Family Medicine

## 2017-06-06 MED ORDER — ATORVASTATIN CALCIUM 80 MG PO TABS: 80.0000 mg | ORAL_TABLET | Freq: Every day | ORAL | 3 refills | Status: DC

## 2017-06-10 ENCOUNTER — Other Ambulatory Visit: Payer: Self-pay | Admitting: Family Medicine

## 2017-06-11 MED ORDER — HYDROCODONE-ACETAMINOPHEN 5-325 MG PO TABS: 1.0000 | ORAL_TABLET | Freq: Four times a day (QID) | ORAL | 0 refills | Status: DC | PRN

## 2017-06-11 NOTE — Telephone Encounter (Signed)
Please advise patient that hydrocodone/apap  prescription has been sent electronically to   [id number] [place] [address] [facility] [address] [place] [id number] Phone: [phone] Fax: [phone]

## 2017-06-11 NOTE — Telephone Encounter (Signed)
Patient was advised.  

## 2017-06-23 ENCOUNTER — Other Ambulatory Visit: Payer: Self-pay | Admitting: Family Medicine

## 2017-06-24 MED ORDER — HYDROCODONE-ACETAMINOPHEN 5-325 MG PO TABS: 1.0000 | ORAL_TABLET | Freq: Four times a day (QID) | ORAL | 0 refills | Status: DC | PRN

## 2017-07-01 DIAGNOSIS — I350 Nonrheumatic aortic (valve) stenosis: Secondary | ICD-10-CM | POA: Diagnosis not present

## 2017-07-04 ENCOUNTER — Other Ambulatory Visit: Payer: Self-pay | Admitting: Family Medicine

## 2017-07-07 MED ORDER — HYDROCODONE-ACETAMINOPHEN 5-325 MG PO TABS: 1.0000 | ORAL_TABLET | Freq: Four times a day (QID) | ORAL | 0 refills | Status: DC | PRN

## 2017-07-18 ENCOUNTER — Other Ambulatory Visit: Payer: Self-pay | Admitting: Family Medicine

## 2017-07-18 MED ORDER — HYDROCODONE-ACETAMINOPHEN 5-325 MG PO TABS: 1.0000 | ORAL_TABLET | Freq: Four times a day (QID) | ORAL | 0 refills | Status: DC | PRN

## 2017-07-30 ENCOUNTER — Other Ambulatory Visit: Payer: Self-pay | Admitting: Family Medicine

## 2017-07-30 MED ORDER — HYDROCODONE-ACETAMINOPHEN 5-325 MG PO TABS: 1.0000 | ORAL_TABLET | Freq: Four times a day (QID) | ORAL | 0 refills | Status: DC | PRN

## 2017-08-01 DIAGNOSIS — I350 Nonrheumatic aortic (valve) stenosis: Secondary | ICD-10-CM | POA: Diagnosis not present

## 2017-08-12 ENCOUNTER — Other Ambulatory Visit: Payer: Self-pay | Admitting: Family Medicine

## 2017-08-12 MED ORDER — HYDROCODONE-ACETAMINOPHEN 5-325 MG PO TABS: 1.0000 | ORAL_TABLET | Freq: Four times a day (QID) | ORAL | 0 refills | Status: DC | PRN

## 2017-08-13 ENCOUNTER — Encounter: Payer: Self-pay | Admitting: Family Medicine

## 2017-08-13 DIAGNOSIS — F419 Anxiety disorder, unspecified: Secondary | ICD-10-CM

## 2017-08-18 MED ORDER — ALPRAZOLAM 0.5 MG PO TABS: 0.5000 mg | ORAL_TABLET | Freq: Four times a day (QID) | ORAL | 5 refills | Status: DC | PRN

## 2017-08-26 ENCOUNTER — Other Ambulatory Visit: Payer: Self-pay | Admitting: Family Medicine

## 2017-08-26 MED ORDER — HYDROCODONE-ACETAMINOPHEN 5-325 MG PO TABS: 1.0000 | ORAL_TABLET | Freq: Four times a day (QID) | ORAL | 0 refills | Status: DC | PRN

## 2017-08-27 ENCOUNTER — Encounter: Payer: Self-pay | Admitting: Family Medicine

## 2017-08-28 ENCOUNTER — Other Ambulatory Visit: Payer: Self-pay | Admitting: *Deleted

## 2017-08-28 MED ORDER — OMEPRAZOLE 20 MG PO CPDR: 20.0000 mg | DELAYED_RELEASE_CAPSULE | Freq: Every day | ORAL | 4 refills | Status: DC

## 2017-09-04 ENCOUNTER — Other Ambulatory Visit: Payer: Self-pay | Admitting: Family Medicine

## 2017-09-05 ENCOUNTER — Other Ambulatory Visit: Payer: Self-pay | Admitting: Family Medicine

## 2017-09-05 NOTE — Telephone Encounter (Signed)
[facility] faxed a refill request for the following medication. Thanks CC  omeprazole (PRILOSEC) 20 MG capsule

## 2017-09-08 ENCOUNTER — Other Ambulatory Visit: Payer: Self-pay | Admitting: Family Medicine

## 2017-09-09 ENCOUNTER — Other Ambulatory Visit: Payer: Self-pay | Admitting: Family Medicine

## 2017-09-09 MED ORDER — HYDROCODONE-ACETAMINOPHEN 5-325 MG PO TABS: 1.0000 | ORAL_TABLET | Freq: Four times a day (QID) | ORAL | 0 refills | Status: DC | PRN

## 2017-09-19 ENCOUNTER — Other Ambulatory Visit: Payer: Self-pay | Admitting: Family Medicine

## 2017-09-19 MED ORDER — HYDROCODONE-ACETAMINOPHEN 5-325 MG PO TABS
1.0000 | ORAL_TABLET | Freq: Four times a day (QID) | ORAL | 0 refills | Status: DC | PRN
Start: 2017-09-19 — End: 2017-10-02

## 2017-09-20 ENCOUNTER — Other Ambulatory Visit: Payer: Self-pay | Admitting: Family Medicine

## 2017-09-20 NOTE — Telephone Encounter (Signed)
[facility] faxed a refill request for a 90-days supply for the following medication. Thanks CC  omeprazole (PRILOSEC) 20 MG capsule

## 2017-09-21 ENCOUNTER — Other Ambulatory Visit: Payer: Self-pay | Admitting: Family Medicine

## 2017-09-22 NOTE — Telephone Encounter (Signed)
[facility]/17.

## 2017-10-02 ENCOUNTER — Other Ambulatory Visit: Payer: Self-pay | Admitting: Family Medicine

## 2017-10-03 MED ORDER — HYDROCODONE-ACETAMINOPHEN 5-325 MG PO TABS: 1.0000 | ORAL_TABLET | Freq: Four times a day (QID) | ORAL | 0 refills | Status: DC | PRN

## 2017-10-16 ENCOUNTER — Other Ambulatory Visit: Payer: Self-pay | Admitting: Family Medicine

## 2017-10-16 MED ORDER — HYDROCODONE-ACETAMINOPHEN 5-325 MG PO TABS: 1.0000 | ORAL_TABLET | Freq: Four times a day (QID) | ORAL | 0 refills | Status: DC | PRN

## 2017-10-20 DIAGNOSIS — I10 Essential (primary) hypertension: Secondary | ICD-10-CM | POA: Diagnosis not present

## 2017-10-20 DIAGNOSIS — R0602 Shortness of breath: Secondary | ICD-10-CM | POA: Diagnosis not present

## 2017-10-20 DIAGNOSIS — E785 Hyperlipidemia, unspecified: Secondary | ICD-10-CM | POA: Diagnosis not present

## 2017-10-20 DIAGNOSIS — I350 Nonrheumatic aortic (valve) stenosis: Secondary | ICD-10-CM | POA: Diagnosis not present

## 2017-10-29 ENCOUNTER — Other Ambulatory Visit: Payer: Self-pay | Admitting: Family Medicine

## 2017-10-30 ENCOUNTER — Other Ambulatory Visit: Payer: Self-pay | Admitting: Family Medicine

## 2017-10-30 DIAGNOSIS — I350 Nonrheumatic aortic (valve) stenosis: Secondary | ICD-10-CM | POA: Diagnosis not present

## 2017-10-30 MED ORDER — HYDROCODONE-ACETAMINOPHEN 5-325 MG PO TABS: 1.0000 | ORAL_TABLET | Freq: Four times a day (QID) | ORAL | 0 refills | Status: DC | PRN

## 2017-11-13 ENCOUNTER — Other Ambulatory Visit: Payer: Self-pay | Admitting: Family Medicine

## 2017-11-13 NOTE — Telephone Encounter (Signed)
Patient is overdue for follow up cholesterol and blood pressure. Needs to schedule follow up o.v. Before refill can be approved.

## 2017-11-14 MED ORDER — HYDROCODONE-ACETAMINOPHEN 5-325 MG PO TABS: 1.0000 | ORAL_TABLET | Freq: Four times a day (QID) | ORAL | 0 refills | Status: DC | PRN

## 2017-11-14 NOTE — Telephone Encounter (Signed)
Advised patient as below. Appt scheduled for 5/7 for follow up.

## 2017-11-17 NOTE — Progress Notes (Signed)
Patient: Isaac Hancock Male    DOB: 11/20/1955   62 y.o.   MRN: [id number] Visit Date: 11/18/2017  Today's Provider: Mila Merry, MD   Chief Complaint  Patient presents with  . Follow-up  . Hypertension  . Hyperlipidemia  . Back Pain   Subjective:    HPI   Hypertension, follow-up:  BP Readings from Last 3 Encounters:  11/18/17 (!) 110/54  05/29/17 110/60  12/23/16 (!) 100/50    He was last seen for hypertension 11 months ago.  BP at that visit was 110/50. Management since that visit includes; no changes. Medications managed by Dr. Welton Flakes. He is scheduled for echocardiogram later this week. Marland KitchenHe reports good compliance with treatment. He is not having side effects. none He is exercising. He is adherent to low salt diet.   Outside blood pressures are not checking. He is experiencing none.  Patient denies none.   Cardiovascular risk factors include advanced age (older than 79 for men, 64 for women).  Use of agents associated with hypertension: none.   ----------------------------------------------------------------    Lipid/Cholesterol, Follow-up:   Last seen for this 6 months ago.  Management since that visit includes; labs checked, increased atorvastatin to 80 mg qd.  Lab Results  Component Value Date   CHOL 276 (H) 05/29/2017   HDL 39 (L) 05/29/2017   LDLCALC  05/29/2017   TRIG 838 (H) 05/29/2017   CHOLHDL 7.1 (H) 05/29/2017     He reports good compliance with treatment. He is not having side effects. none  Wt Readings from Last 3 Encounters:  11/18/17 221 lb (100.2 kg)  05/29/17 213 lb (96.6 kg)  12/23/16 217 lb (98.4 kg)    ----------------------------------------------------------------  Back pain  Current treatment Hydrocodone-Acetominophen. He states he usually takes 2-3 tablets a day and remains effective, but does not eliminate pain. He is able to work   Allergies  Allergen Reactions  . Crestor [Rosuvastatin Calcium]     High  LFTs     Current Outpatient Medications:  .  ALPRAZolam (XANAX) 0.5 MG tablet, Take 1 tablet (0.5 mg total) by mouth every 6 (six) hours as needed., Disp: 60 tablet, Rfl: 5 .  amLODipine (NORVASC) 10 MG tablet, , Disp: , Rfl:  .  atorvastatin (LIPITOR) 80 MG tablet, Take 1 tablet (80 mg total) by mouth daily., Disp: 90 tablet, Rfl: 3 .  enalapril (VASOTEC) 20 MG tablet, Take 20 mg by mouth 2 (two) times daily. , Disp: , Rfl:  .  fexofenadine (ALLEGRA) 60 MG tablet, Take 60 mg by mouth 2 (two) times daily. , Disp: , Rfl:  .  furosemide (LASIX) 20 MG tablet, Take 20 mg by mouth daily. , Disp: , Rfl:  .  HYDROcodone-acetaminophen (NORCO/VICODIN) 5-325 MG tablet, Take 1-2 tablets by mouth every 6 (six) hours as needed for moderate pain., Disp: 60 tablet, Rfl: 0 .  metoprolol (TOPROL-XL) 200 MG 24 hr tablet, TAKE 1 TABLET DAILY, Disp: 90 tablet, Rfl: 3 .  MULTIPLE VITAMIN PO, Take 1 tablet by mouth daily. , Disp: , Rfl:  .  NON FORMULARY, , Disp: , Rfl:  .  omega-3 acid ethyl esters (LOVAZA) 1 g capsule, TAKE 1 CAPSULE TWICE A DAY, Disp: 120 capsule, Rfl: 5 .  omeprazole (PRILOSEC) 20 MG capsule, TAKE 1 CAPSULE DAILY, Disp: 90 capsule, Rfl: 4 .  Vitamin D, Ergocalciferol, (DRISDOL) 50000 units CAPS capsule, TAKE 1 CAPSULE ONCE WEEKLY, Disp: 12 capsule, Rfl: 4 .  warfarin (  COUMADIN) 6 MG tablet, Take 6 mg daily by mouth., Disp: , Rfl:   Review of Systems  Constitutional: Negative for appetite change, chills and fever.  Respiratory: Negative for chest tightness, shortness of breath and wheezing.   Cardiovascular: Negative for chest pain and palpitations.  Gastrointestinal: Negative for abdominal pain, nausea and vomiting.    Social History   Tobacco Use  . Smoking status: Never Smoker  . Smokeless tobacco: Current User    Types: Snuff, Chew  . Tobacco comment: 2 cans per week  Substance Use Topics  . Alcohol use: No    Alcohol/week: 0.0 oz   Objective:   BP (!) 110/54 (BP Location:  Right Arm, Patient Position: Sitting, Cuff Size: Large)   Pulse 81   Temp 98.3 F (36.8 C) (Oral)   Resp 16   Wt 221 lb (100.2 kg)   SpO2 98%   BMI 29.97 kg/m  Vitals:   11/18/17 0816  BP: (!) 110/54  Pulse: 81  Resp: 16  Temp: 98.3 F (36.8 C)  TempSrc: Oral  SpO2: 98%  Weight: 221 lb (100.2 kg)     Physical Exam   General Appearance:    Alert, cooperative, no distress  Eyes:    PERRL, conjunctiva/corneas clear, EOM's intact       Lungs:     Clear to auscultation bilaterally, respirations unlabored  Heart:    Regular rate and rhythm  Neurologic:   Awake, alert, oriented x 3. No apparent focal neurological           defect.   MS:   Moderate tenderness over lumbar spine and para lumbar tenderness. No gross deformities.        Assessment & Plan:     1. Benign essential HTN Well controlled.  Continue current medications.   - Comprehensive metabolic panel  2. Hyperlipidemia, mixed He is tolerating atorvastatin well with no adverse effects.   - Lipid panel - Comprehensive metabolic panel  3. DDD (degenerative disc disease), lumbosacral   4. Chronic midline low back pain, with sciatica presence unspecified Stable on current pain medication regiment.  - Pain Mgt Scrn (14 Drugs), Ur  5. Scoliosis, unspecified scoliosis type, unspecified spinal region   6. Chronic, continuous use of opioids  - Pain Mgt Scrn (14 Drugs), Ur  7. Prostate cancer screening  - PSA       Mila Merry, MD  [facility]

## 2017-11-18 ENCOUNTER — Ambulatory Visit: Payer: BLUE CROSS/BLUE SHIELD | Admitting: Family Medicine

## 2017-11-18 ENCOUNTER — Encounter: Payer: Self-pay | Admitting: Family Medicine

## 2017-11-18 VITALS — BP 110/54 | HR 81 | Temp 98.3°F | Resp 16 | Wt 221.0 lb

## 2017-11-18 DIAGNOSIS — I10 Essential (primary) hypertension: Secondary | ICD-10-CM | POA: Diagnosis not present

## 2017-11-18 DIAGNOSIS — M419 Scoliosis, unspecified: Secondary | ICD-10-CM | POA: Diagnosis not present

## 2017-11-18 DIAGNOSIS — Z125 Encounter for screening for malignant neoplasm of prostate: Secondary | ICD-10-CM | POA: Diagnosis not present

## 2017-11-18 DIAGNOSIS — E782 Mixed hyperlipidemia: Secondary | ICD-10-CM

## 2017-11-18 DIAGNOSIS — M545 Low back pain: Secondary | ICD-10-CM

## 2017-11-18 DIAGNOSIS — Z0283 Encounter for blood-alcohol and blood-drug test: Secondary | ICD-10-CM | POA: Diagnosis not present

## 2017-11-18 DIAGNOSIS — F1190 Opioid use, unspecified, uncomplicated: Secondary | ICD-10-CM

## 2017-11-18 DIAGNOSIS — G8929 Other chronic pain: Secondary | ICD-10-CM | POA: Diagnosis not present

## 2017-11-18 DIAGNOSIS — M5137 Other intervertebral disc degeneration, lumbosacral region: Secondary | ICD-10-CM | POA: Diagnosis not present

## 2017-11-19 ENCOUNTER — Telehealth: Payer: Self-pay | Admitting: *Deleted

## 2017-11-19 LAB — LIPID PANEL
Chol/HDL Ratio: 8.2 ratio — ABNORMAL HIGH (ref 0.0–5.0)
Cholesterol, Total: 287 mg/dL — ABNORMAL HIGH (ref 100–199)
HDL: 35 mg/dL — AB (ref >39)
Triglycerides: 879 mg/dL (ref 0–149)

## 2017-11-19 LAB — COMPREHENSIVE METABOLIC PANEL
ALBUMIN: 4.4 g/dL (ref 3.5–5.5)
ALK PHOS: 113 IU/L (ref 39–117)
ALT: 61 IU/L — ABNORMAL HIGH (ref 0–44)
AST: 90 IU/L — AB (ref 0–40)
Albumin/Globulin Ratio: 1.6 (ref 1.2–2.2)
BILIRUBIN TOTAL: 0.7 mg/dL (ref 0.0–1.2)
BUN / CREAT RATIO: 11 (ref 9–20)
BUN: 11 mg/dL (ref 6–24)
CHLORIDE: 95 mmol/L — AB (ref 96–106)
CO2: 23 mmol/L (ref 20–29)
Calcium: 9.3 mg/dL (ref 8.7–10.2)
Creatinine, Ser: 1.03 mg/dL (ref 0.76–1.27)
GFR calc Af Amer: 91 mL/min/{1.73_m2} (ref >59)
GFR calc non Af Amer: 79 mL/min/{1.73_m2} (ref >59)
GLOBULIN, TOTAL: 2.7 g/dL (ref 1.5–4.5)
GLUCOSE: 140 mg/dL — AB (ref 65–99)
Potassium: 4.3 mmol/L (ref 3.5–5.2)
SODIUM: 137 mmol/L (ref 134–144)
Total Protein: 7.1 g/dL (ref 6.0–8.5)

## 2017-11-19 LAB — PAIN MGT SCRN (14 DRUGS), UR
AMPHETAMINE SCREEN URINE: NEGATIVE ng/mL
BARBITURATE SCREEN URINE: NEGATIVE ng/mL
BENZODIAZEPINE SCREEN, URINE: NEGATIVE ng/mL
BUPRENORPHINE, URINE: NEGATIVE ng/mL
CANNABINOIDS UR QL SCN: NEGATIVE ng/mL
COCAINE(METAB.)SCREEN, URINE: NEGATIVE ng/mL
Creatinine(Crt), U: 76.3 mg/dL (ref 20.0–300.0)
Fentanyl, Urine: NEGATIVE pg/mL
MEPERIDINE SCREEN, URINE: NEGATIVE ng/mL
METHADONE SCREEN, URINE: NEGATIVE ng/mL
OPIATE SCREEN URINE: POSITIVE ng/mL — AB
OXYCODONE+OXYMORPHONE UR QL SCN: NEGATIVE ng/mL
PROPOXYPHENE SCREEN URINE: NEGATIVE ng/mL
Ph of Urine: 6 (ref 4.5–8.9)
Phencyclidine Qn, Ur: NEGATIVE ng/mL
Tramadol Screen, Urine: NEGATIVE ng/mL

## 2017-11-19 LAB — PSA: PROSTATE SPECIFIC AG, SERUM: 0.5 ng/mL (ref 0.0–4.0)

## 2017-11-19 MED ORDER — ROSUVASTATIN CALCIUM 40 MG PO TABS: 40.0000 mg | ORAL_TABLET | Freq: Every day | ORAL | 0 refills | Status: DC

## 2017-11-19 NOTE — Telephone Encounter (Signed)
-----   Message from Malva Limes, MD sent at 11/19/2017  8:05 AM EDT ----- Cholesterol is up a little bit to 287, triglycerides are about the same at 879, need to change from atorvastatin to rosuvastatin  once a day, #30, rf x 4 Blood sugar is borderline for diabetes, need to strictly avoid sweets and starch foods. Avoid alcohol. Schedule follow up in 3 months to check sugar, a1c, and lipids.

## 2017-11-19 NOTE — Telephone Encounter (Signed)
Patient was notified of results. Expressed understanding. Rx sent to pharmacy. F/u scheduled for 02/19/18 at 8:20 am.

## 2017-11-20 DIAGNOSIS — M51379 Other intervertebral disc degeneration, lumbosacral region without mention of lumbar back pain or lower extremity pain: Secondary | ICD-10-CM | POA: Insufficient documentation

## 2017-11-20 DIAGNOSIS — M419 Scoliosis, unspecified: Secondary | ICD-10-CM | POA: Insufficient documentation

## 2017-11-20 DIAGNOSIS — M5137 Other intervertebral disc degeneration, lumbosacral region: Secondary | ICD-10-CM | POA: Insufficient documentation

## 2017-11-21 DIAGNOSIS — I10 Essential (primary) hypertension: Secondary | ICD-10-CM | POA: Diagnosis not present

## 2017-11-21 DIAGNOSIS — I2510 Atherosclerotic heart disease of native coronary artery without angina pectoris: Secondary | ICD-10-CM | POA: Diagnosis not present

## 2017-11-21 DIAGNOSIS — E785 Hyperlipidemia, unspecified: Secondary | ICD-10-CM | POA: Diagnosis not present

## 2017-11-21 DIAGNOSIS — I350 Nonrheumatic aortic (valve) stenosis: Secondary | ICD-10-CM | POA: Diagnosis not present

## 2017-11-27 ENCOUNTER — Other Ambulatory Visit: Payer: Self-pay | Admitting: Family Medicine

## 2017-11-27 NOTE — Telephone Encounter (Signed)
Please review. Thanks!  

## 2017-11-28 MED ORDER — HYDROCODONE-ACETAMINOPHEN 5-325 MG PO TABS: 1.0000 | ORAL_TABLET | Freq: Four times a day (QID) | ORAL | 0 refills | Status: DC | PRN

## 2017-12-02 ENCOUNTER — Encounter: Payer: Self-pay | Admitting: Family Medicine

## 2017-12-11 ENCOUNTER — Other Ambulatory Visit: Payer: Self-pay | Admitting: Family Medicine

## 2017-12-12 ENCOUNTER — Other Ambulatory Visit: Payer: Self-pay | Admitting: Family Medicine

## 2017-12-13 MED ORDER — HYDROCODONE-ACETAMINOPHEN 5-325 MG PO TABS: 1.0000 | ORAL_TABLET | Freq: Four times a day (QID) | ORAL | 0 refills | Status: DC | PRN

## 2017-12-19 DIAGNOSIS — I350 Nonrheumatic aortic (valve) stenosis: Secondary | ICD-10-CM | POA: Diagnosis not present

## 2017-12-25 ENCOUNTER — Other Ambulatory Visit: Payer: Self-pay | Admitting: Family Medicine

## 2017-12-25 DIAGNOSIS — I350 Nonrheumatic aortic (valve) stenosis: Secondary | ICD-10-CM | POA: Diagnosis not present

## 2017-12-26 MED ORDER — HYDROCODONE-ACETAMINOPHEN 5-325 MG PO TABS: 1.0000 | ORAL_TABLET | Freq: Four times a day (QID) | ORAL | 0 refills | Status: DC | PRN

## 2017-12-31 ENCOUNTER — Encounter: Payer: Self-pay | Admitting: Family Medicine

## 2017-12-31 DIAGNOSIS — F419 Anxiety disorder, unspecified: Secondary | ICD-10-CM

## 2017-12-31 MED ORDER — ALPRAZOLAM 0.5 MG PO TABS: 0.5000 mg | ORAL_TABLET | Freq: Four times a day (QID) | ORAL | 5 refills | Status: DC | PRN

## 2018-01-08 ENCOUNTER — Other Ambulatory Visit: Payer: Self-pay | Admitting: Family Medicine

## 2018-01-08 DIAGNOSIS — I350 Nonrheumatic aortic (valve) stenosis: Secondary | ICD-10-CM | POA: Diagnosis not present

## 2018-01-08 MED ORDER — HYDROCODONE-ACETAMINOPHEN 5-325 MG PO TABS: 1.0000 | ORAL_TABLET | Freq: Four times a day (QID) | ORAL | 0 refills | Status: DC | PRN

## 2018-01-22 ENCOUNTER — Other Ambulatory Visit: Payer: Self-pay | Admitting: Family Medicine

## 2018-01-22 MED ORDER — HYDROCODONE-ACETAMINOPHEN 5-325 MG PO TABS: 1.0000 | ORAL_TABLET | Freq: Four times a day (QID) | ORAL | 0 refills | Status: DC | PRN

## 2018-01-25 ENCOUNTER — Other Ambulatory Visit: Payer: Self-pay | Admitting: Family Medicine

## 2018-02-05 ENCOUNTER — Other Ambulatory Visit: Payer: Self-pay | Admitting: Family Medicine

## 2018-02-05 MED ORDER — HYDROCODONE-ACETAMINOPHEN 5-325 MG PO TABS: 1.0000 | ORAL_TABLET | Freq: Four times a day (QID) | ORAL | 0 refills | Status: DC | PRN

## 2018-02-10 DIAGNOSIS — I350 Nonrheumatic aortic (valve) stenosis: Secondary | ICD-10-CM | POA: Diagnosis not present

## 2018-02-18 ENCOUNTER — Other Ambulatory Visit: Payer: Self-pay | Admitting: Family Medicine

## 2018-02-19 ENCOUNTER — Ambulatory Visit: Payer: BLUE CROSS/BLUE SHIELD | Admitting: Family Medicine

## 2018-02-19 ENCOUNTER — Encounter: Payer: Self-pay | Admitting: Family Medicine

## 2018-02-19 VITALS — BP 106/66 | HR 64 | Temp 98.2°F | Resp 16 | Wt 220.0 lb

## 2018-02-19 DIAGNOSIS — E782 Mixed hyperlipidemia: Secondary | ICD-10-CM

## 2018-02-19 DIAGNOSIS — Z9989 Dependence on other enabling machines and devices: Secondary | ICD-10-CM

## 2018-02-19 DIAGNOSIS — G4733 Obstructive sleep apnea (adult) (pediatric): Secondary | ICD-10-CM | POA: Diagnosis not present

## 2018-02-19 DIAGNOSIS — R7303 Prediabetes: Secondary | ICD-10-CM

## 2018-02-19 DIAGNOSIS — I10 Essential (primary) hypertension: Secondary | ICD-10-CM | POA: Diagnosis not present

## 2018-02-19 MED ORDER — HYDROCODONE-ACETAMINOPHEN 5-325 MG PO TABS: 1.0000 | ORAL_TABLET | Freq: Four times a day (QID) | ORAL | 0 refills | Status: DC | PRN

## 2018-02-19 NOTE — Progress Notes (Signed)
Patient: Isaac Hancock Male    DOB: [birth year]   62 y.o.   MRN: [id number] Visit Date: 02/19/2018  Today's Provider: Mila Merry, MD   Chief Complaint  Patient presents with  . Follow-up  . Hypertension  . Hyperlipidemia   Subjective:    HPI   Hypertension, follow-up:  BP Readings from Last 3 Encounters:  02/19/18 106/66  11/18/17 (!) 110/54  05/29/17 110/60    He was last seen for hypertension 3 months ago.  BP at that visit was 110/54. Management since that visit includes; labs checked, no changes.He reports good compliance with treatment. He is not having side effects. none He is exercising. He is adherent to low salt diet.   Outside blood pressures are not checking. He is experiencing none.  Patient denies none.   Cardiovascular risk factors include advanced age (older than 31 for men, 81 for women).  Use of agents associated with hypertension: none.   ---------------------------------------------------------------    Lipid/Cholesterol, Follow-up:   Last seen for this 3 months ago.  Management since that visit includes; changed from atorvastatin to rosuvastatin 40 mg qd due to very high triglycerides.  Last Lipid Panel: Lab Results  Component Value Date   CHOL 287 (H) 11/18/2017   HDL 35 (L) 11/18/2017   LDLCALC Comment 11/18/2017   TRIG 879 (HH) 11/18/2017   CHOLHDL 8.2 (H) 11/18/2017      He reports good compliance with treatment. He is not having side effects. none  Wt Readings from Last 3 Encounters:  02/19/18 220 lb (99.8 kg)  11/18/17 221 lb (100.2 kg)  05/29/17 213 lb (96.6 kg)    ---------------------------------------------------------------     Hyperglycemia From 11/18/2017-labs checked. Recommended he avoid sweets and starchy foods. Will recheck A1c in 3 months. Lab Results  Component Value Date   HGBA1C 5.6 07/19/2016    He also reports he is using CPAP every night, is tolerating well and feels well rested in the  morning.    Allergies  Allergen Reactions  . Crestor [Rosuvastatin Calcium]     High LFTs     Current Outpatient Medications:  .  ALPRAZolam (XANAX) 0.5 MG tablet, Take 1 tablet (0.5 mg total) by mouth every 6 (six) hours as needed., Disp: 60 tablet, Rfl: 5 .  amLODipine (NORVASC) 10 MG tablet, , Disp: , Rfl:  .  enalapril (VASOTEC) 20 MG tablet, Take 20 mg by mouth 2 (two) times daily. , Disp: , Rfl:  .  fexofenadine (ALLEGRA) 60 MG tablet, Take 60 mg by mouth 2 (two) times daily. , Disp: , Rfl:  .  furosemide (LASIX) 20 MG tablet, Take 20 mg by mouth daily. , Disp: , Rfl:  .  HYDROcodone-acetaminophen (NORCO/VICODIN) 5-325 MG tablet, Take 1-2 tablets by mouth every 6 (six) hours as needed for moderate pain., Disp: 60 tablet, Rfl: 0 .  metoprolol (TOPROL-XL) 200 MG 24 hr tablet, TAKE 1 TABLET DAILY, Disp: 90 tablet, Rfl: 3 .  MULTIPLE VITAMIN PO, Take 1 tablet by mouth daily. , Disp: , Rfl:  .  omega-3 acid ethyl esters (LOVAZA) 1 g capsule, TAKE 1 CAPSULE TWICE A DAY, Disp: 120 capsule, Rfl: 5 .  omeprazole (PRILOSEC) 20 MG capsule, TAKE 1 CAPSULE DAILY, Disp: 90 capsule, Rfl: 4 .  rosuvastatin (CRESTOR) 40 MG tablet, Take 1 tablet (40 mg total) by mouth daily., Disp: 90 tablet, Rfl: 0 .  Vitamin D, Ergocalciferol, (DRISDOL) 50000 units CAPS capsule, TAKE 1 CAPSULE ONCE  WEEKLY, Disp: 12 capsule, Rfl: 4 .  warfarin (COUMADIN) 6 MG tablet, Take 6 mg daily by mouth., Disp: , Rfl:  .  NON FORMULARY, , Disp: , Rfl:   Review of Systems  Constitutional: Negative for appetite change, chills and fever.  Respiratory: Negative for chest tightness, shortness of breath and wheezing.   Cardiovascular: Negative for chest pain and palpitations.  Gastrointestinal: Negative for abdominal pain, nausea and vomiting.    Social History   Tobacco Use  . Smoking status: Never Smoker  . Smokeless tobacco: Current User    Types: Snuff, Chew  . Tobacco comment: 2 cans per week  Substance Use Topics    . Alcohol use: No    Alcohol/week: 0.0 standard drinks   Objective:   BP 106/66 (BP Location: Right Arm, Patient Position: Sitting, Cuff Size: Large)   Pulse 64   Temp 98.2 F (36.8 C) (Oral)   Resp 16   Wt 220 lb (99.8 kg)   SpO2 96%   BMI 29.84 kg/m  Vitals:   02/19/18 0822  BP: 106/66  Pulse: 64  Resp: 16  Temp: 98.2 F (36.8 C)  TempSrc: Oral  SpO2: 96%  Weight: 220 lb (99.8 kg)     Physical Exam   General Appearance:    Alert, cooperative, no distress  Eyes:    PERRL, conjunctiva/corneas clear, EOM's intact       Lungs:     Clear to auscultation bilaterally, respirations unlabored  Heart:    Regular rate and rhythm  Neurologic:   Awake, alert, oriented x 3. No apparent focal neurological           defect.           Assessment & Plan:     1. Hyperlipidemia, mixed Tolerating change to rosuvastatin without adverse effects, is only taking 1 Lovaza twice a day, consider increase to 2 twice a day.  - Comprehensive metabolic panel - Lipid panel - CBC  2. Benign essential HTN Well controlled.  Continue current medications.    3. Prediabetes  - Hemoglobin A1c  4. OSA on CPAP Complaint and medically benefiting from CPAP.        Mila Merryonald Benjamim Harnish, MD  [facility]

## 2018-02-20 LAB — COMPREHENSIVE METABOLIC PANEL
A/G RATIO: 2.1 (ref 1.2–2.2)
ALK PHOS: 126 IU/L — AB (ref 39–117)
ALT: 70 IU/L — AB (ref 0–44)
AST: 117 IU/L — AB (ref 0–40)
Albumin: 4.4 g/dL (ref 3.6–4.8)
BILIRUBIN TOTAL: 0.6 mg/dL (ref 0.0–1.2)
BUN/Creatinine Ratio: 10 (ref 10–24)
BUN: 14 mg/dL (ref 8–27)
CHLORIDE: 96 mmol/L (ref 96–106)
CO2: 24 mmol/L (ref 20–29)
Calcium: 9.4 mg/dL (ref 8.6–10.2)
Creatinine, Ser: 1.34 mg/dL — ABNORMAL HIGH (ref 0.76–1.27)
GFR calc Af Amer: 66 mL/min/{1.73_m2} (ref >59)
GFR calc non Af Amer: 57 mL/min/{1.73_m2} — ABNORMAL LOW (ref >59)
GLUCOSE: 117 mg/dL — AB (ref 65–99)
Globulin, Total: 2.1 g/dL (ref 1.5–4.5)
POTASSIUM: 4.4 mmol/L (ref 3.5–5.2)
Sodium: 137 mmol/L (ref 134–144)
Total Protein: 6.5 g/dL (ref 6.0–8.5)

## 2018-02-20 LAB — LIPID PANEL
CHOLESTEROL TOTAL: 299 mg/dL — AB (ref 100–199)
Chol/HDL Ratio: 7.9 ratio — ABNORMAL HIGH (ref 0.0–5.0)
HDL: 38 mg/dL — ABNORMAL LOW (ref >39)
Triglycerides: 674 mg/dL (ref 0–149)

## 2018-02-20 LAB — CBC
HEMATOCRIT: 35.4 % — AB (ref 37.5–51.0)
Hemoglobin: 11.5 g/dL — ABNORMAL LOW (ref 13.0–17.7)
MCH: 33 pg (ref 26.6–33.0)
MCHC: 32.5 g/dL (ref 31.5–35.7)
MCV: 101 fL — AB (ref 79–97)
PLATELETS: 169 10*3/uL (ref 150–450)
RBC: 3.49 x10E6/uL — AB (ref 4.14–5.80)
RDW: 15 % (ref 12.3–15.4)
WBC: 4.8 10*3/uL (ref 3.4–10.8)

## 2018-02-20 LAB — HEMOGLOBIN A1C
Est. average glucose Bld gHb Est-mCnc: 117 mg/dL
Hgb A1c MFr Bld: 5.7 % — ABNORMAL HIGH (ref 4.8–5.6)

## 2018-02-23 ENCOUNTER — Other Ambulatory Visit: Payer: Self-pay | Admitting: Family Medicine

## 2018-02-23 MED ORDER — OMEGA-3-ACID ETHYL ESTERS 1 G PO CAPS: 2.0000 g | ORAL_CAPSULE | Freq: Two times a day (BID) | ORAL | 1 refills | Status: DC

## 2018-02-23 MED ORDER — ATORVASTATIN CALCIUM 80 MG PO TABS: 80.0000 mg | ORAL_TABLET | Freq: Every day | ORAL | 1 refills | Status: DC

## 2018-03-04 ENCOUNTER — Other Ambulatory Visit: Payer: Self-pay | Admitting: Family Medicine

## 2018-03-04 MED ORDER — HYDROCODONE-ACETAMINOPHEN 5-325 MG PO TABS: 1.0000 | ORAL_TABLET | Freq: Four times a day (QID) | ORAL | 0 refills | Status: DC | PRN

## 2018-03-13 DIAGNOSIS — I350 Nonrheumatic aortic (valve) stenosis: Secondary | ICD-10-CM | POA: Diagnosis not present

## 2018-03-16 ENCOUNTER — Other Ambulatory Visit: Payer: Self-pay | Admitting: Family Medicine

## 2018-03-18 ENCOUNTER — Other Ambulatory Visit: Payer: Self-pay | Admitting: Family Medicine

## 2018-03-18 MED ORDER — HYDROCODONE-ACETAMINOPHEN 5-325 MG PO TABS: 1.0000 | ORAL_TABLET | Freq: Four times a day (QID) | ORAL | 0 refills | Status: DC | PRN

## 2018-03-31 ENCOUNTER — Other Ambulatory Visit: Payer: Self-pay | Admitting: Family Medicine

## 2018-03-31 ENCOUNTER — Encounter: Payer: Self-pay | Admitting: Family Medicine

## 2018-03-31 DIAGNOSIS — F419 Anxiety disorder, unspecified: Secondary | ICD-10-CM

## 2018-03-31 MED ORDER — HYDROCODONE-ACETAMINOPHEN 5-325 MG PO TABS: 1.0000 | ORAL_TABLET | Freq: Four times a day (QID) | ORAL | 0 refills | Status: DC | PRN

## 2018-03-31 MED ORDER — ALPRAZOLAM 0.5 MG PO TABS: 0.5000 mg | ORAL_TABLET | Freq: Four times a day (QID) | ORAL | 5 refills | Status: DC | PRN

## 2018-04-13 ENCOUNTER — Other Ambulatory Visit: Payer: Self-pay | Admitting: Family Medicine

## 2018-04-13 MED ORDER — HYDROCODONE-ACETAMINOPHEN 5-325 MG PO TABS: 1.0000 | ORAL_TABLET | Freq: Four times a day (QID) | ORAL | 0 refills | Status: DC | PRN

## 2018-04-27 ENCOUNTER — Other Ambulatory Visit: Payer: Self-pay | Admitting: Family Medicine

## 2018-04-27 MED ORDER — HYDROCODONE-ACETAMINOPHEN 5-325 MG PO TABS: 1.0000 | ORAL_TABLET | Freq: Four times a day (QID) | ORAL | 0 refills | Status: DC | PRN

## 2018-05-04 ENCOUNTER — Ambulatory Visit: Payer: BLUE CROSS/BLUE SHIELD | Admitting: Family Medicine

## 2018-05-04 ENCOUNTER — Encounter: Payer: Self-pay | Admitting: Family Medicine

## 2018-05-04 VITALS — BP 108/60 | HR 68 | Temp 98.9°F | Resp 16 | Wt 223.0 lb

## 2018-05-04 DIAGNOSIS — R7989 Other specified abnormal findings of blood chemistry: Secondary | ICD-10-CM

## 2018-05-04 DIAGNOSIS — R945 Abnormal results of liver function studies: Secondary | ICD-10-CM

## 2018-05-04 DIAGNOSIS — E782 Mixed hyperlipidemia: Secondary | ICD-10-CM | POA: Diagnosis not present

## 2018-05-04 DIAGNOSIS — Z23 Encounter for immunization: Secondary | ICD-10-CM

## 2018-05-04 NOTE — Patient Instructions (Signed)
   Avoid alcoholic drinks. Limit to no more than 2 drinks in a day when you do drink

## 2018-05-04 NOTE — Addendum Note (Signed)
Addended by: Kavin Leech E on: 05/04/2018 04:44 PM   Modules accepted: Orders

## 2018-05-04 NOTE — Progress Notes (Signed)
Patient: Isaac Hancock Male    DOB: [birth year]   62 y.o.   MRN: [id number] Visit Date: 05/04/2018  Today's Provider: Mila Merry, MD   Chief Complaint  Patient presents with  . Hyperlipidemia  . Elevated Hepatic Enzymes   Subjective:    Hyperlipidemia  This is a chronic problem. The problem is uncontrolled. Recent lipid tests were reviewed and are high. Pertinent negatives include no chest pain, focal weakness, leg pain, myalgias or shortness of breath. Current antihyperlipidemic treatment includes statins.    Triglycerides were improved but still elevated, Liver functions have gone back up.  Pt advised to D/C Crestor and go back on atorvastatin increase Lovaza to twice a day. He states he usaully drinks a couple of beers every day.   CMP  CMP Latest Ref Rng & Units 02/19/2018 11/18/2017 05/29/2017  Glucose 65 - 99 mg/dL 409(W) 119(J) 478(G)  BUN 8 - 27 mg/dL 14 11 13   Creatinine 0.76 - 1.27 mg/dL 9.56(O) 1.30 8.65  Sodium 134 - 144 mmol/L 137 137 138  Potassium 3.5 - 5.2 mmol/L 4.4 4.3 4.3  Chloride 96 - 106 mmol/L 96 95(L) 100  CO2 20 - 29 mmol/L 24 23 30   Calcium 8.6 - 10.2 mg/dL 9.4 9.3 8.8  Total Protein 6.0 - 8.5 g/dL 6.5 7.1 6.3  Total Bilirubin 0.0 - 1.2 mg/dL 0.6 0.7 0.8  Alkaline Phos 39 - 117 IU/L 126(H) 113 -  AST 0 - 40 IU/L 117(H) 90(H) 92(H)  ALT 0 - 44 IU/L 70(H) 61(H) 56(H)      Lab Results  Component Value Date   CHOL 299 (H) 02/19/2018   CHOL 287 (H) 11/18/2017   CHOL 276 (H) 05/29/2017   Lab Results  Component Value Date   HDL 38 (L) 02/19/2018   HDL 35 (L) 11/18/2017   HDL 39 (L) 05/29/2017   Lab Results  Component Value Date   LDLCALC Comment 02/19/2018   LDLCALC Comment 11/18/2017   LDLCALC  05/29/2017     Comment:     . LDL cholesterol not calculated. Triglyceride levels greater than 400 mg/dL invalidate calculated LDL results. . Reference range: <100 . Desirable range <100 mg/dL for primary prevention;   <70 mg/dL for  patients with CHD or diabetic patients  with > or = 2 CHD risk factors. Marland Kitchen LDL-C is now calculated using the Martin-Hopkins  calculation, which is a validated novel method providing  better accuracy than the Friedewald equation in the  estimation of LDL-C.  Horald Pollen et al. Lenox Ahr. 7846;962(95): 2061-2068  (http://education.[employer].com/faq/FAQ164)    Lab Results  Component Value Date   TRIG 674 (HH) 02/19/2018   TRIG 879 (HH) 11/18/2017   TRIG 838 (H) 05/29/2017   Lab Results  Component Value Date   CHOLHDL 7.9 (H) 02/19/2018   CHOLHDL 8.2 (H) 11/18/2017   CHOLHDL 7.1 (H) 05/29/2017   No results found for: LDLDIRECT Wt Readings from Last 3 Encounters:  05/04/18 223 lb (101.2 kg)  02/19/18 220 lb (99.8 kg)  11/18/17 221 lb (100.2 kg)   Lab Results  Component Value Date   ALT 70 (H) 02/19/2018   AST 117 (H) 02/19/2018   ALKPHOS 126 (H) 02/19/2018   BILITOT 0.6 02/19/2018        Allergies  Allergen Reactions  . Crestor [Rosuvastatin Calcium]     High LFTs     Current Outpatient Medications:  .  ALPRAZolam (XANAX) 0.5 MG tablet, Take 1 tablet (0.5  mg total) by mouth every 6 (six) hours as needed., Disp: 60 tablet, Rfl: 5 .  amLODipine (NORVASC) 10 MG tablet, , Disp: , Rfl:  .  atorvastatin (LIPITOR) 80 MG tablet, Take 1 tablet (80 mg total) by mouth daily., Disp: 90 tablet, Rfl: 1 .  enalapril (VASOTEC) 20 MG tablet, Take 20 mg by mouth 2 (two) times daily. , Disp: , Rfl:  .  fexofenadine (ALLEGRA) 60 MG tablet, Take 60 mg by mouth 2 (two) times daily. , Disp: , Rfl:  .  furosemide (LASIX) 20 MG tablet, Take 20 mg by mouth daily. , Disp: , Rfl:  .  HYDROcodone-acetaminophen (NORCO/VICODIN) 5-325 MG tablet, Take 1-2 tablets by mouth every 6 (six) hours as needed for moderate pain., Disp: 60 tablet, Rfl: 0 .  metoprolol (TOPROL-XL) 200 MG 24 hr tablet, TAKE 1 TABLET DAILY, Disp: 90 tablet, Rfl: 3 .  MULTIPLE VITAMIN PO, Take 1 tablet by mouth daily. , Disp: ,  Rfl:  .  omega-3 acid ethyl esters (LOVAZA) 1 g capsule, Take 2 capsules (2 g total) by mouth 2 (two) times daily., Disp: 360 capsule, Rfl: 1 .  Vitamin D, Ergocalciferol, (DRISDOL) 50000 units CAPS capsule, TAKE 1 CAPSULE ONCE WEEKLY, Disp: 12 capsule, Rfl: 4 .  warfarin (COUMADIN) 6 MG tablet, Take 6 mg daily by mouth., Disp: , Rfl:  .  NON FORMULARY, , Disp: , Rfl:  .  omeprazole (PRILOSEC) 20 MG capsule, TAKE 1 CAPSULE DAILY, Disp: 90 capsule, Rfl: 4  Review of Systems  Constitutional: Negative.   Respiratory: Negative.  Negative for shortness of breath.   Cardiovascular: Negative for chest pain.  Gastrointestinal: Negative.   Musculoskeletal: Negative.  Negative for myalgias.  Neurological: Negative for dizziness, focal weakness, light-headedness and headaches.    Social History   Tobacco Use  . Smoking status: Never Smoker  . Smokeless tobacco: Current User    Types: Snuff, Chew  . Tobacco comment: 2 cans per week  Substance Use Topics  . Alcohol use: No    Alcohol/week: 0.0 standard drinks   Objective:   BP 108/60 (BP Location: Right Arm, Patient Position: Sitting, Cuff Size: Normal)   Pulse 68   Temp 98.9 F (37.2 C) (Oral)   Resp 16   Wt 223 lb (101.2 kg)   BMI 30.24 kg/m  Vitals:   05/04/18 0849  BP: 108/60  Pulse: 68  Resp: 16  Temp: 98.9 F (37.2 C)  TempSrc: Oral  Weight: 223 lb (101.2 kg)     Physical Exam   General Appearance:    Alert, cooperative, no distress  Eyes:    PERRL, conjunctiva/corneas clear, EOM's intact       Lungs:     Clear to auscultation bilaterally, respirations unlabored  Heart:    Regular rate and rhythm  Neurologic:   Awake, alert, oriented x 3. No apparent focal neurological           defect.           Assessment & Plan:     1. Hyperlipidemia, mixed Tolerating increased dose of lovaza and taking atorvastatin consistently.  - Comprehensive metabolic panel - Lipid panel  2. Liver function test  abnormality Multifactorial, worsened when changed to rosuvastatin, no back on atorvastatin. Counseled to avoid alcohol. Consider repeat u/s if not improved.  - Comprehensive metabolic panel       Mila Merry, MD  [facility]

## 2018-05-05 ENCOUNTER — Telehealth: Payer: Self-pay

## 2018-05-05 LAB — COMPREHENSIVE METABOLIC PANEL
ALT: 51 IU/L — AB (ref 0–44)
AST: 66 IU/L — ABNORMAL HIGH (ref 0–40)
Albumin/Globulin Ratio: 2.1 (ref 1.2–2.2)
Albumin: 4.5 g/dL (ref 3.6–4.8)
Alkaline Phosphatase: 107 IU/L (ref 39–117)
BUN/Creatinine Ratio: 13 (ref 10–24)
BUN: 16 mg/dL (ref 8–27)
Bilirubin Total: 0.9 mg/dL (ref 0.0–1.2)
CALCIUM: 9.3 mg/dL (ref 8.6–10.2)
CO2: 25 mmol/L (ref 20–29)
Chloride: 96 mmol/L (ref 96–106)
Creatinine, Ser: 1.27 mg/dL (ref 0.76–1.27)
GFR, EST AFRICAN AMERICAN: 71 mL/min/{1.73_m2} (ref >59)
GFR, EST NON AFRICAN AMERICAN: 61 mL/min/{1.73_m2} (ref >59)
GLUCOSE: 111 mg/dL — AB (ref 65–99)
Globulin, Total: 2.1 g/dL (ref 1.5–4.5)
Potassium: 4.4 mmol/L (ref 3.5–5.2)
Sodium: 137 mmol/L (ref 134–144)
TOTAL PROTEIN: 6.6 g/dL (ref 6.0–8.5)

## 2018-05-05 LAB — LIPID PANEL
CHOL/HDL RATIO: 5 ratio (ref 0.0–5.0)
Cholesterol, Total: 255 mg/dL — ABNORMAL HIGH (ref 100–199)
HDL: 51 mg/dL (ref >39)
LDL Calculated: 127 mg/dL — ABNORMAL HIGH (ref 0–99)
Triglycerides: 383 mg/dL — ABNORMAL HIGH (ref 0–149)
VLDL CHOLESTEROL CAL: 77 mg/dL — AB (ref 5–40)

## 2018-05-05 NOTE — Telephone Encounter (Signed)
-----   Message from Malva Limes, MD sent at 05/05/2018  7:51 AM EDT ----- Liver functions are still slightly elevated but much better. Cholesterol is better, down to 255. Triglycerides better, down to 383. need to avoid alcohol as much as possible. Continue current dose of atorvastatin. Schedule follow up for lipids, BP and labs in 6 months.

## 2018-05-05 NOTE — Telephone Encounter (Signed)
Patient was advised and has an scheduled appointment already.,PC

## 2018-05-11 ENCOUNTER — Other Ambulatory Visit: Payer: Self-pay | Admitting: Family Medicine

## 2018-05-11 MED ORDER — HYDROCODONE-ACETAMINOPHEN 5-325 MG PO TABS: 1.0000 | ORAL_TABLET | Freq: Four times a day (QID) | ORAL | 0 refills | Status: DC | PRN

## 2018-05-25 ENCOUNTER — Other Ambulatory Visit: Payer: Self-pay | Admitting: Family Medicine

## 2018-05-25 MED ORDER — HYDROCODONE-ACETAMINOPHEN 5-325 MG PO TABS: 1.0000 | ORAL_TABLET | Freq: Four times a day (QID) | ORAL | 0 refills | Status: DC | PRN

## 2018-05-29 DIAGNOSIS — I10 Essential (primary) hypertension: Secondary | ICD-10-CM | POA: Diagnosis not present

## 2018-05-29 DIAGNOSIS — Z952 Presence of prosthetic heart valve: Secondary | ICD-10-CM | POA: Diagnosis not present

## 2018-05-29 DIAGNOSIS — I350 Nonrheumatic aortic (valve) stenosis: Secondary | ICD-10-CM | POA: Diagnosis not present

## 2018-05-29 DIAGNOSIS — E785 Hyperlipidemia, unspecified: Secondary | ICD-10-CM | POA: Diagnosis not present

## 2018-06-08 ENCOUNTER — Other Ambulatory Visit: Payer: Self-pay | Admitting: Family Medicine

## 2018-06-08 MED ORDER — HYDROCODONE-ACETAMINOPHEN 5-325 MG PO TABS: 1.0000 | ORAL_TABLET | Freq: Four times a day (QID) | ORAL | 0 refills | Status: DC | PRN

## 2018-06-17 ENCOUNTER — Encounter: Payer: Self-pay | Admitting: Family Medicine

## 2018-06-18 ENCOUNTER — Other Ambulatory Visit: Payer: Self-pay | Admitting: Family Medicine

## 2018-06-18 NOTE — Telephone Encounter (Signed)
Patient returned CMA call. KW

## 2018-06-18 NOTE — Telephone Encounter (Signed)
Pt returned missed call.  Please call pt back. ° °Thanks, °TGH °

## 2018-06-18 NOTE — Telephone Encounter (Signed)
LMTCB 06/18/2018  Thanks,   -Laura 

## 2018-06-18 NOTE — Telephone Encounter (Signed)
Please call patient regarding MyChart message report soreness in hands and wrist. Need to know if there is any redness, swelling, or fever. Need to know if there was any injury to sore area.

## 2018-06-18 NOTE — Telephone Encounter (Signed)
Left vm for pt to call back.  dbs 

## 2018-06-19 ENCOUNTER — Telehealth: Payer: Self-pay

## 2018-06-19 MED ORDER — PREDNISONE 10 MG PO TABS: ORAL_TABLET | ORAL | 0 refills | Status: DC

## 2018-06-19 NOTE — Telephone Encounter (Signed)
Pt returned cal about hand and wrist soreness.  Pt reports that he woke up on 06/11/18 and the pain was there.  He recalls no injury.  Pt states that the worse part of the soreness is in his left elbow.  He also reports the right hand and wrist pain.  He states that just using his hands and arms with work and everyday functions causes the pain and soreness.  Pt hasn't tried any treatment such as ice or heat or any medications to help with the pain.  Pt can be called back at [phone].  I tried to schedule pt for a OV but he stated he would wait and see.  dbs

## 2018-06-19 NOTE — Telephone Encounter (Signed)
[place] [facility] [place]  [employer] [place].   Thanks,   -Vernona RiegerLaura

## 2018-06-19 NOTE — Telephone Encounter (Signed)
LMTCB 06/19/2018  Thanks,   -Mykiah Schmuck  

## 2018-06-19 NOTE — Telephone Encounter (Signed)
Can send in course of prednisone, but need to know which pharmacy .

## 2018-06-19 NOTE — Telephone Encounter (Signed)
LMTCB 06/19/2018  Thanks,   -Vernona RiegerLaura

## 2018-06-22 ENCOUNTER — Other Ambulatory Visit: Payer: Self-pay | Admitting: Family Medicine

## 2018-06-22 MED ORDER — HYDROCODONE-ACETAMINOPHEN 5-325 MG PO TABS: 1.0000 | ORAL_TABLET | Freq: Four times a day (QID) | ORAL | 0 refills | Status: DC | PRN

## 2018-06-22 NOTE — Telephone Encounter (Signed)
Opened in error

## 2018-06-30 ENCOUNTER — Other Ambulatory Visit: Payer: Self-pay | Admitting: Family Medicine

## 2018-07-01 MED ORDER — PREDNISONE 10 MG PO TABS: ORAL_TABLET | ORAL | 0 refills | Status: DC

## 2018-07-03 ENCOUNTER — Other Ambulatory Visit: Payer: Self-pay | Admitting: Family Medicine

## 2018-07-03 MED ORDER — HYDROCODONE-ACETAMINOPHEN 5-325 MG PO TABS: 1.0000 | ORAL_TABLET | Freq: Four times a day (QID) | ORAL | 0 refills | Status: DC | PRN

## 2018-07-11 ENCOUNTER — Other Ambulatory Visit: Payer: Self-pay | Admitting: Family Medicine

## 2018-07-13 MED ORDER — PREDNISONE 10 MG PO TABS: ORAL_TABLET | ORAL | 0 refills | Status: DC

## 2018-07-16 ENCOUNTER — Other Ambulatory Visit: Payer: Self-pay | Admitting: Family Medicine

## 2018-07-17 MED ORDER — HYDROCODONE-ACETAMINOPHEN 5-325 MG PO TABS: 1.0000 | ORAL_TABLET | Freq: Four times a day (QID) | ORAL | 0 refills | Status: DC | PRN

## 2018-07-29 ENCOUNTER — Encounter: Payer: Self-pay | Admitting: Family Medicine

## 2018-07-29 ENCOUNTER — Other Ambulatory Visit: Payer: Self-pay | Admitting: Family Medicine

## 2018-07-29 MED ORDER — PREDNISONE 10 MG PO TABS: ORAL_TABLET | ORAL | 0 refills | Status: DC

## 2018-07-29 MED ORDER — HYDROCODONE-ACETAMINOPHEN 5-325 MG PO TABS: 1.0000 | ORAL_TABLET | Freq: Four times a day (QID) | ORAL | 0 refills | Status: DC | PRN

## 2018-07-31 DIAGNOSIS — I350 Nonrheumatic aortic (valve) stenosis: Secondary | ICD-10-CM | POA: Diagnosis not present

## 2018-08-05 DIAGNOSIS — I350 Nonrheumatic aortic (valve) stenosis: Secondary | ICD-10-CM | POA: Diagnosis not present

## 2018-08-06 ENCOUNTER — Encounter: Payer: Self-pay | Admitting: Emergency Medicine

## 2018-08-06 ENCOUNTER — Other Ambulatory Visit: Payer: Self-pay

## 2018-08-06 ENCOUNTER — Emergency Department
Admission: EM | Admit: 2018-08-06 | Discharge: 2018-08-06 | Disposition: A | Discharge status: Home / Self Care | Payer: BLUE CROSS/BLUE SHIELD | Attending: Emergency Medicine | Admitting: Emergency Medicine

## 2018-08-06 DIAGNOSIS — Z79891 Long term (current) use of opiate analgesic: Secondary | ICD-10-CM | POA: Diagnosis not present

## 2018-08-06 DIAGNOSIS — I10 Essential (primary) hypertension: Secondary | ICD-10-CM | POA: Insufficient documentation

## 2018-08-06 DIAGNOSIS — R040 Epistaxis: Secondary | ICD-10-CM | POA: Diagnosis not present

## 2018-08-06 DIAGNOSIS — F17220 Nicotine dependence, chewing tobacco, uncomplicated: Secondary | ICD-10-CM | POA: Diagnosis not present

## 2018-08-06 DIAGNOSIS — Z79899 Other long term (current) drug therapy: Secondary | ICD-10-CM | POA: Diagnosis not present

## 2018-08-06 DIAGNOSIS — Z7901 Long term (current) use of anticoagulants: Secondary | ICD-10-CM | POA: Diagnosis not present

## 2018-08-06 LAB — CBC
HCT: 36.8 % — ABNORMAL LOW (ref 39.0–52.0)
HEMOGLOBIN: 12.9 g/dL — AB (ref 13.0–17.0)
MCH: 34.2 pg — ABNORMAL HIGH (ref 26.0–34.0)
MCHC: 35.1 g/dL (ref 30.0–36.0)
MCV: 97.6 fL (ref 80.0–100.0)
Platelets: 158 10*3/uL (ref 150–400)
RBC: 3.77 MIL/uL — ABNORMAL LOW (ref 4.22–5.81)
RDW: 13.3 % (ref 11.5–15.5)
WBC: 7.1 10*3/uL (ref 4.0–10.5)
nRBC: 0 % (ref 0.0–0.2)

## 2018-08-06 LAB — PROTIME-INR
INR: 3.68
Prothrombin Time: 36 seconds — ABNORMAL HIGH (ref 11.4–15.2)

## 2018-08-06 MED ORDER — OXYMETAZOLINE HCL 0.05 % NA SOLN
1.0000 | Freq: Once | NASAL | Status: DC
  Filled 2018-08-06 (×2): qty 15

## 2018-08-06 NOTE — ED Notes (Signed)
AAx3.  Skin warm and dry. NAD 

## 2018-08-06 NOTE — ED Provider Notes (Addendum)
[facility] Emergency Department Provider Note  ____________________________________________  Time seen: Approximately 5:20 PM  I have reviewed the triage vital signs and the nursing notes.   HISTORY  Chief Complaint Epistaxis    HPI Isaac Hancock is a 63 y.o. male with a history of GERD, hypertension, prediabetes, who complains of nosebleed that started about 1:00 PM today while bending over.  He is on warfarin, just had his levels checked last week.  No changes in his dosage.  No other bleeding, no black or bloody stools.  No trauma.  He does note that his workplace is very cold and his skin has been very dry due to the recent weather with cracked lips and knuckles.  Denies any dizziness or syncope.  No headache, he has been compliant with medications.      Past Medical History:  Diagnosis Date  . History of chicken pox   . History of measles   . History of mumps      Patient Active Problem List   Diagnosis Date Noted  . DDD (degenerative disc disease), lumbosacral 11/20/2017  . Scoliosis 11/20/2017  . Chronic, continuous use of opioids 07/19/2016  . Anxiety 07/06/2015  . Chronic back pain 07/06/2015  . Carpal tunnel syndrome 07/06/2015  . Esophageal reflux 07/06/2015  . Gouty arthritis 07/06/2015  . Liver function test abnormality 07/06/2015  . Pancreatitis, acute 07/06/2015  . Sciatica of left side 07/06/2015  . OSA on CPAP 07/06/2015  . Wrist joint pain 07/06/2015  . Vitamin D deficiency 11/10/2009  . H/O aortic valve replacement 08/23/2009  . Umbilical hernia without obstruction and without gangrene 05/31/2009  . Family history of cardiovascular disease 10/31/2008  . Allergic rhinitis 08/02/2008  . Hyperlipidemia, mixed 12/14/2007  . GERD (gastroesophageal reflux disease) 08/31/2007  . Benign essential HTN 08/31/2007  . Prediabetes 08/31/2007  . Tobacco use disorder 08/31/2007     Past Surgical History:  Procedure Laterality Date   . AORTIC VALVE REPLACEMENT  08/2009   Mechanical; [facility]. [place]. discharged on Coumadin and Toprol XL  . CARDIAC CATHETERIZATION  08/2009   Normal Coronary Arteries, Normal EF; Severe aortic stenosis  . COLONOSCOPY  12/2010   Normal; symptoms: OC Light +. Iftikhar  . DOPPLER ECHOCARDIOGRAPHY  02/18/2012   normal EF. Done by Dr. Welton FlakesKhan for follow AV repair  . ETT  2005   Cardiolite 07/2009: inferior- leteral ischemia  . FOOT SURGERY Left 04/16/2013  . HERNIA REPAIR  06/2011   UMBILICAL     Prior to Admission medications   Medication Sig Start Date End Date Taking? Authorizing Provider  ALPRAZolam Prudy Feeler(XANAX) 0.5 MG tablet Take 1 tablet (0.5 mg total) by mouth every 6 (six) hours as needed. 03/31/18   Malva LimesFisher, Donald E, MD  amLODipine (NORVASC) 10 MG tablet  06/23/16   [provider]  atorvastatin (LIPITOR) 80 MG tablet Take 1 tablet (80 mg total) by mouth daily. 02/23/18   Malva LimesFisher, Donald E, MD  enalapril (VASOTEC) 20 MG tablet Take 20 mg by mouth 2 (two) times daily.     [provider]  fexofenadine (ALLEGRA) 60 MG tablet Take 60 mg by mouth 2 (two) times daily.  08/02/09   [provider]  furosemide (LASIX) 20 MG tablet Take 20 mg by mouth daily.     [provider]  HYDROcodone-acetaminophen (NORCO/VICODIN) 5-325 MG tablet Take 1-2 tablets by mouth every 6 (six) hours as needed for moderate pain. 07/29/18   Malva LimesFisher, Donald E, MD  metoprolol (  TOPROL-XL) 200 MG 24 hr tablet TAKE 1 TABLET DAILY 01/25/18   Malva Limes, MD  MULTIPLE VITAMIN PO Take 1 tablet by mouth daily.  05/30/09   [provider]  NON FORMULARY     [provider]  omega-3 acid ethyl esters (LOVAZA) 1 g capsule Take 2 capsules (2 g total) by mouth 2 (two) times daily. 02/23/18   Malva Limes, MD  omeprazole (PRILOSEC) 20 MG capsule TAKE 1 CAPSULE DAILY 09/21/17   Malva Limes, MD  predniSONE (DELTASONE) 10 MG tablet 6 tablets for 2 days, then 5 for 2 days, then 4  for 2 days, then 3 for 2 days, then 2 for 2 days, then 1 for 2 days. 07/29/18 08/07/18  Malva Limes, MD  Vitamin D, Ergocalciferol, (DRISDOL) 50000 units CAPS capsule TAKE 1 CAPSULE ONCE WEEKLY 03/16/18   Malva Limes, MD  warfarin (COUMADIN) 6 MG tablet Take 6 mg daily by mouth. 02/23/17   [provider]     Allergies Crestor [rosuvastatin calcium]   Family History  Problem Relation Age of Onset  . Hypertension Mother     Social History Social History   Tobacco Use  . Smoking status: Never Smoker  . Smokeless tobacco: Current User    Types: Snuff, Chew  . Tobacco comment: 2 cans per week  Substance Use Topics  . Alcohol use: No    Alcohol/week: 0.0 standard drinks  . Drug use: No    Review of Systems  Constitutional:   No fever or chills.  ENT:   No sore throat. No rhinorrhea.  Positive epistaxis Cardiovascular:   No chest pain or syncope. Respiratory:   No dyspnea or cough. Gastrointestinal:   Negative for abdominal pain, vomiting and diarrhea.  Musculoskeletal:   Negative for focal pain or swelling All other systems reviewed and are negative except as documented above in ROS and HPI.  ____________________________________________   PHYSICAL EXAM:  VITAL SIGNS: ED Triage Vitals [08/06/18 1520]  Enc Vitals Group     BP 132/63     Pulse Rate 77     Resp 18     Temp 98.1 F (36.7 C)     Temp Source Oral     SpO2 97 %     Weight 215 lb (97.5 kg)     Height 6' (1.829 m)     Head Circumference      Peak Flow      Pain Score 0     Pain Loc      Pain Edu?      Excl. in GC?     Vital signs reviewed, nursing assessments reviewed.   Constitutional:   Alert and oriented. Non-toxic appearance. Eyes:   Conjunctivae are normal. EOMI. Marland Kitchen ENT      Head:   Normocephalic and atraumatic.      Nose:   No congestion/rhinnorhea.  Small amount of oozing from the right anterior naris from abraded area along the septum.      Mouth/Throat:   MMM, no  pharyngeal erythema. No peritonsillar mass.  No blood in the oropharynx      Neck:   No meningismus. Full ROM. Hematological/Lymphatic/Immunilogical:   No cervical lymphadenopathy. Cardiovascular:   RRR.  Respiratory:   Normal respiratory effort without tachypnea/retractions.  Musculoskeletal:   Normal range of motion in all extremities. No joint effusions.  No lower extremity tenderness.  No edema. Neurologic:   Normal speech and language.  Motor grossly intact.  No acute focal neurologic deficits are appreciated.  Skin:    Skin is warm, dry and intact. No rash noted.  No petechiae, purpura, or bullae.  ____________________________________________    LABS (pertinent positives/negatives) (all labs ordered are listed, but only abnormal results are displayed) Labs Reviewed  PROTIME-INR - Abnormal; Notable for the following components:      Result Value   Prothrombin Time 36.0 (*)    All other components within normal limits  CBC - Abnormal; Notable for the following components:   RBC 3.77 (*)    Hemoglobin 12.9 (*)    HCT 36.8 (*)    MCH 34.2 (*)    All other components within normal limits   ____________________________________________   EKG Interpreted by me Normal sinus rhythm rate of 78, normal axis intervals QRS ST segments and T waves.   ____________________________________________    RADIOLOGY  No results found.  ____________________________________________   PROCEDURES .Epistaxis Management Date/Time: 08/06/2018 5:23 PM Performed by: Sharman CheekStafford, Lonita Debes, MD Authorized by: Sharman CheekStafford, Cheralyn Oliver, MD   Consent:    Consent obtained:  Verbal   Consent given by:  Patient   Risks discussed:  Bleeding Procedure details:    Treatment site:  R anterior   Repair method: Afrin, nasal clamp.   Treatment complexity:  Limited   Treatment episode: initial   Post-procedure details:    Assessment:  Bleeding stopped   Patient tolerance of procedure:  Tolerated well, no  immediate complications    ____________________________________________    CLINICAL IMPRESSION / ASSESSMENT AND PLAN / ED COURSE  Pertinent labs & imaging results that were available during my care of the patient were reviewed by me and considered in my medical decision making (see chart for details).    Patient presents with anterior epistaxis.  INR is 3.6, very close to target range of 2.5-3.5 for his mechanical heart valve.  Continue current warfarin dosing, no reversal needed.  Clinical Course as of Aug 06 1718  Thu Aug 06, 2018  1615 Afrin and nasal clamp applied   [PS]  1700 Clamp removed, no bleeding.  We will continue to observe for a few minutes and plan for discharge.  Continue current warfarin dosing given he has been on it for a long time and has a mechanical valve.  Follow-up for INR check next week.   [PS]    Clinical Course User Index [PS] Sharman CheekStafford, Khameron Gruenwald, MD     ____________________________________________   FINAL CLINICAL IMPRESSION(S) / ED DIAGNOSES    Final diagnoses:  Anterior epistaxis     ED Discharge Orders    None      Portions of this note were generated with dragon dictation software. Dictation errors may occur despite best attempts at proofreading.   Sharman CheekStafford, Isabele Lollar, MD 08/06/18 Eber [id number]    Sharman CheekStafford, Ordell Prichett, MD 08/06/18 1723

## 2018-08-06 NOTE — ED Notes (Signed)
AAOx3 NAD.  No epistaxis seen.

## 2018-08-06 NOTE — ED Triage Notes (Signed)
Pt states he bent over at work around 1300 today and had two drops of blood hit the floor from his right nares, has been bleeding ever since, appears in NAD. Denies pain.

## 2018-08-06 NOTE — Discharge Instructions (Addendum)
Avoid nose blowing or putting anything in your nose for the next several days until it is able to heal.  Dabbing a small amount of petroleum jelly very gently at the edge of your nostril can help the dryness of your skin surface in the nose.

## 2018-08-13 ENCOUNTER — Other Ambulatory Visit: Payer: Self-pay | Admitting: Family Medicine

## 2018-08-13 DIAGNOSIS — I350 Nonrheumatic aortic (valve) stenosis: Secondary | ICD-10-CM | POA: Diagnosis not present

## 2018-08-13 MED ORDER — HYDROCODONE-ACETAMINOPHEN 5-325 MG PO TABS: 1.0000 | ORAL_TABLET | Freq: Four times a day (QID) | ORAL | 0 refills | Status: DC | PRN

## 2018-08-23 ENCOUNTER — Other Ambulatory Visit: Payer: Self-pay | Admitting: Family Medicine

## 2018-08-26 ENCOUNTER — Other Ambulatory Visit: Payer: Self-pay | Admitting: Family Medicine

## 2018-08-27 MED ORDER — HYDROCODONE-ACETAMINOPHEN 5-325 MG PO TABS: 1.0000 | ORAL_TABLET | Freq: Four times a day (QID) | ORAL | 0 refills | Status: DC | PRN

## 2018-08-28 ENCOUNTER — Encounter: Payer: Self-pay | Admitting: Family Medicine

## 2018-08-28 DIAGNOSIS — F419 Anxiety disorder, unspecified: Secondary | ICD-10-CM

## 2018-08-28 MED ORDER — ALPRAZOLAM 0.5 MG PO TABS: 0.5000 mg | ORAL_TABLET | Freq: Four times a day (QID) | ORAL | 5 refills | Status: DC | PRN

## 2018-08-30 ENCOUNTER — Other Ambulatory Visit: Payer: Self-pay | Admitting: Family Medicine

## 2018-08-31 DIAGNOSIS — I350 Nonrheumatic aortic (valve) stenosis: Secondary | ICD-10-CM | POA: Diagnosis not present

## 2018-09-08 ENCOUNTER — Other Ambulatory Visit: Payer: Self-pay | Admitting: Family Medicine

## 2018-09-09 DIAGNOSIS — I350 Nonrheumatic aortic (valve) stenosis: Secondary | ICD-10-CM | POA: Diagnosis not present

## 2018-09-09 MED ORDER — HYDROCODONE-ACETAMINOPHEN 5-325 MG PO TABS: 1.0000 | ORAL_TABLET | Freq: Four times a day (QID) | ORAL | 0 refills | Status: DC | PRN

## 2018-09-10 ENCOUNTER — Encounter: Payer: Self-pay | Admitting: Family Medicine

## 2018-09-10 MED ORDER — PREDNISONE 10 MG PO TABS: ORAL_TABLET | ORAL | 0 refills | Status: DC

## 2018-09-22 ENCOUNTER — Other Ambulatory Visit: Payer: Self-pay | Admitting: Family Medicine

## 2018-09-22 MED ORDER — HYDROCODONE-ACETAMINOPHEN 5-325 MG PO TABS: 1.0000 | ORAL_TABLET | Freq: Four times a day (QID) | ORAL | 0 refills | Status: DC | PRN

## 2018-09-30 DIAGNOSIS — I350 Nonrheumatic aortic (valve) stenosis: Secondary | ICD-10-CM | POA: Diagnosis not present

## 2018-10-02 ENCOUNTER — Other Ambulatory Visit: Payer: Self-pay | Admitting: Family Medicine

## 2018-10-02 MED ORDER — HYDROCODONE-ACETAMINOPHEN 5-325 MG PO TABS: 1.0000 | ORAL_TABLET | Freq: Four times a day (QID) | ORAL | 0 refills | Status: DC | PRN

## 2018-10-14 ENCOUNTER — Encounter: Payer: Self-pay | Admitting: Family Medicine

## 2018-10-14 DIAGNOSIS — F419 Anxiety disorder, unspecified: Secondary | ICD-10-CM

## 2018-10-15 ENCOUNTER — Other Ambulatory Visit: Payer: Self-pay | Admitting: Family Medicine

## 2018-10-15 NOTE — Telephone Encounter (Signed)
Please Review

## 2018-10-16 MED ORDER — HYDROCODONE-ACETAMINOPHEN 5-325 MG PO TABS: 1.0000 | ORAL_TABLET | Freq: Four times a day (QID) | ORAL | 0 refills | Status: DC | PRN

## 2018-10-20 MED ORDER — ALPRAZOLAM 0.5 MG PO TABS: 0.5000 mg | ORAL_TABLET | Freq: Four times a day (QID) | ORAL | 5 refills | Status: DC | PRN

## 2018-10-29 ENCOUNTER — Other Ambulatory Visit: Payer: Self-pay | Admitting: Family Medicine

## 2018-10-30 ENCOUNTER — Other Ambulatory Visit: Payer: Self-pay | Admitting: Family Medicine

## 2018-10-30 MED ORDER — HYDROCODONE-ACETAMINOPHEN 5-325 MG PO TABS: 1.0000 | ORAL_TABLET | Freq: Four times a day (QID) | ORAL | 0 refills | Status: DC | PRN

## 2018-11-04 ENCOUNTER — Ambulatory Visit: Payer: BLUE CROSS/BLUE SHIELD | Admitting: Family Medicine

## 2018-11-11 ENCOUNTER — Other Ambulatory Visit: Payer: Self-pay | Admitting: Family Medicine

## 2018-11-12 MED ORDER — HYDROCODONE-ACETAMINOPHEN 5-325 MG PO TABS: 1.0000 | ORAL_TABLET | Freq: Four times a day (QID) | ORAL | 0 refills | Status: DC | PRN

## 2018-11-23 ENCOUNTER — Other Ambulatory Visit: Payer: Self-pay | Admitting: Family Medicine

## 2018-11-23 NOTE — Telephone Encounter (Signed)
Please review for Dr. Sherrie Mustache.   Thanks   -Vernona Rieger

## 2018-11-24 MED ORDER — HYDROCODONE-ACETAMINOPHEN 5-325 MG PO TABS: 1.0000 | ORAL_TABLET | Freq: Four times a day (QID) | ORAL | 0 refills | Status: DC | PRN

## 2018-11-27 DIAGNOSIS — E785 Hyperlipidemia, unspecified: Secondary | ICD-10-CM | POA: Diagnosis not present

## 2018-11-27 DIAGNOSIS — I2510 Atherosclerotic heart disease of native coronary artery without angina pectoris: Secondary | ICD-10-CM | POA: Diagnosis not present

## 2018-11-27 DIAGNOSIS — I10 Essential (primary) hypertension: Secondary | ICD-10-CM | POA: Diagnosis not present

## 2018-11-27 DIAGNOSIS — I350 Nonrheumatic aortic (valve) stenosis: Secondary | ICD-10-CM | POA: Diagnosis not present

## 2018-12-05 ENCOUNTER — Other Ambulatory Visit: Payer: Self-pay | Admitting: Family Medicine

## 2018-12-06 ENCOUNTER — Other Ambulatory Visit: Payer: Self-pay | Admitting: Family Medicine

## 2018-12-08 MED ORDER — HYDROCODONE-ACETAMINOPHEN 5-325 MG PO TABS: 1.0000 | ORAL_TABLET | Freq: Four times a day (QID) | ORAL | 0 refills | Status: DC | PRN

## 2018-12-08 NOTE — Telephone Encounter (Signed)
Please review

## 2018-12-18 ENCOUNTER — Other Ambulatory Visit: Payer: Self-pay | Admitting: Family Medicine

## 2018-12-18 MED ORDER — HYDROCODONE-ACETAMINOPHEN 5-325 MG PO TABS: 1.0000 | ORAL_TABLET | Freq: Four times a day (QID) | ORAL | 0 refills | Status: DC | PRN

## 2018-12-24 DIAGNOSIS — I2510 Atherosclerotic heart disease of native coronary artery without angina pectoris: Secondary | ICD-10-CM | POA: Diagnosis not present

## 2018-12-29 ENCOUNTER — Other Ambulatory Visit: Payer: Self-pay | Admitting: Family Medicine

## 2018-12-31 ENCOUNTER — Other Ambulatory Visit: Payer: Self-pay | Admitting: Family Medicine

## 2019-01-01 ENCOUNTER — Other Ambulatory Visit: Payer: Self-pay | Admitting: Family Medicine

## 2019-01-02 ENCOUNTER — Other Ambulatory Visit: Payer: Self-pay | Admitting: Family Medicine

## 2019-01-03 MED ORDER — HYDROCODONE-ACETAMINOPHEN 5-325 MG PO TABS: 1.0000 | ORAL_TABLET | Freq: Four times a day (QID) | ORAL | 0 refills | Status: DC | PRN

## 2019-01-05 MED ORDER — HYDROCODONE-ACETAMINOPHEN 5-325 MG PO TABS: 1.0000 | ORAL_TABLET | Freq: Four times a day (QID) | ORAL | 0 refills | Status: DC | PRN

## 2019-01-07 ENCOUNTER — Other Ambulatory Visit: Payer: Self-pay | Admitting: Family Medicine

## 2019-01-08 DIAGNOSIS — I509 Heart failure, unspecified: Secondary | ICD-10-CM | POA: Diagnosis not present

## 2019-01-12 ENCOUNTER — Ambulatory Visit: Payer: Self-pay | Admitting: Family Medicine

## 2019-01-19 ENCOUNTER — Other Ambulatory Visit: Payer: Self-pay | Admitting: Family Medicine

## 2019-01-19 MED ORDER — HYDROCODONE-ACETAMINOPHEN 5-325 MG PO TABS: 1.0000 | ORAL_TABLET | Freq: Four times a day (QID) | ORAL | 0 refills | Status: DC | PRN

## 2019-01-20 ENCOUNTER — Ambulatory Visit: Payer: Self-pay | Admitting: Physician Assistant

## 2019-01-20 ENCOUNTER — Other Ambulatory Visit: Payer: Self-pay | Admitting: Family Medicine

## 2019-01-21 ENCOUNTER — Ambulatory Visit (INDEPENDENT_AMBULATORY_CARE_PROVIDER_SITE_OTHER): Payer: BC Managed Care – PPO | Admitting: Physician Assistant

## 2019-01-21 ENCOUNTER — Other Ambulatory Visit: Payer: Self-pay

## 2019-01-21 DIAGNOSIS — Z23 Encounter for immunization: Secondary | ICD-10-CM

## 2019-01-21 MED ORDER — PREDNISONE 10 MG PO TABS: ORAL_TABLET | ORAL | 0 refills | Status: DC

## 2019-01-21 NOTE — Telephone Encounter (Signed)
Pharmacy requesting refills. Please review. Thanks!  

## 2019-01-24 ENCOUNTER — Other Ambulatory Visit: Payer: Self-pay | Admitting: Family Medicine

## 2019-02-01 ENCOUNTER — Other Ambulatory Visit: Payer: Self-pay | Admitting: Family Medicine

## 2019-02-01 MED ORDER — HYDROCODONE-ACETAMINOPHEN 5-325 MG PO TABS: 1.0000 | ORAL_TABLET | Freq: Four times a day (QID) | ORAL | 0 refills | Status: DC | PRN

## 2019-02-01 MED ORDER — PREDNISONE 10 MG PO TABS: ORAL_TABLET | ORAL | 0 refills | Status: DC

## 2019-02-09 DIAGNOSIS — I2510 Atherosclerotic heart disease of native coronary artery without angina pectoris: Secondary | ICD-10-CM | POA: Diagnosis not present

## 2019-02-11 ENCOUNTER — Other Ambulatory Visit: Payer: Self-pay | Admitting: Family Medicine

## 2019-02-12 ENCOUNTER — Other Ambulatory Visit: Payer: Self-pay | Admitting: Family Medicine

## 2019-02-12 MED ORDER — HYDROCODONE-ACETAMINOPHEN 5-325 MG PO TABS: 1.0000 | ORAL_TABLET | Freq: Four times a day (QID) | ORAL | 0 refills | Status: DC | PRN

## 2019-02-19 DIAGNOSIS — I2510 Atherosclerotic heart disease of native coronary artery without angina pectoris: Secondary | ICD-10-CM | POA: Diagnosis not present

## 2019-02-23 ENCOUNTER — Other Ambulatory Visit: Payer: Self-pay | Admitting: Family Medicine

## 2019-02-23 MED ORDER — HYDROCODONE-ACETAMINOPHEN 5-325 MG PO TABS: 1.0000 | ORAL_TABLET | Freq: Four times a day (QID) | ORAL | 0 refills | Status: DC | PRN

## 2019-02-24 MED ORDER — PREDNISONE 10 MG PO TABS: ORAL_TABLET | ORAL | 0 refills | Status: DC

## 2019-02-24 MED ORDER — HYDROCODONE-ACETAMINOPHEN 5-325 MG PO TABS: 1.0000 | ORAL_TABLET | Freq: Four times a day (QID) | ORAL | 0 refills | Status: DC | PRN

## 2019-03-14 ENCOUNTER — Other Ambulatory Visit: Payer: Self-pay | Admitting: Family Medicine

## 2019-03-15 ENCOUNTER — Other Ambulatory Visit: Payer: Self-pay | Admitting: Family Medicine

## 2019-03-15 MED ORDER — HYDROCODONE-ACETAMINOPHEN 5-325 MG PO TABS: 1.0000 | ORAL_TABLET | Freq: Four times a day (QID) | ORAL | 0 refills | Status: DC | PRN

## 2019-03-15 MED ORDER — PREDNISONE 10 MG PO TABS: ORAL_TABLET | ORAL | 0 refills | Status: DC

## 2019-03-25 ENCOUNTER — Other Ambulatory Visit: Payer: Self-pay | Admitting: Family Medicine

## 2019-03-25 MED ORDER — HYDROCODONE-ACETAMINOPHEN 5-325 MG PO TABS: 1.0000 | ORAL_TABLET | Freq: Four times a day (QID) | ORAL | 0 refills | Status: DC | PRN

## 2019-04-04 ENCOUNTER — Other Ambulatory Visit: Payer: Self-pay | Admitting: Family Medicine

## 2019-04-08 ENCOUNTER — Encounter: Payer: Self-pay | Admitting: Family Medicine

## 2019-04-08 ENCOUNTER — Other Ambulatory Visit: Payer: Self-pay | Admitting: Family Medicine

## 2019-04-08 MED ORDER — HYDROCODONE-ACETAMINOPHEN 5-325 MG PO TABS: 1.0000 | ORAL_TABLET | Freq: Four times a day (QID) | ORAL | 0 refills | Status: DC | PRN

## 2019-04-20 ENCOUNTER — Other Ambulatory Visit: Payer: Self-pay | Admitting: Family Medicine

## 2019-04-20 MED ORDER — HYDROCODONE-ACETAMINOPHEN 5-325 MG PO TABS: 1.0000 | ORAL_TABLET | Freq: Four times a day (QID) | ORAL | 0 refills | Status: DC | PRN

## 2019-04-28 DIAGNOSIS — I2510 Atherosclerotic heart disease of native coronary artery without angina pectoris: Secondary | ICD-10-CM | POA: Diagnosis not present

## 2019-04-30 DIAGNOSIS — E785 Hyperlipidemia, unspecified: Secondary | ICD-10-CM | POA: Diagnosis not present

## 2019-04-30 DIAGNOSIS — Z952 Presence of prosthetic heart valve: Secondary | ICD-10-CM | POA: Diagnosis not present

## 2019-04-30 DIAGNOSIS — I350 Nonrheumatic aortic (valve) stenosis: Secondary | ICD-10-CM | POA: Diagnosis not present

## 2019-04-30 DIAGNOSIS — I10 Essential (primary) hypertension: Secondary | ICD-10-CM | POA: Diagnosis not present

## 2019-05-01 ENCOUNTER — Other Ambulatory Visit: Payer: Self-pay | Admitting: Family Medicine

## 2019-05-03 MED ORDER — PREDNISONE 10 MG PO TABS: ORAL_TABLET | ORAL | 0 refills | Status: DC

## 2019-05-03 MED ORDER — HYDROCODONE-ACETAMINOPHEN 5-325 MG PO TABS: 1.0000 | ORAL_TABLET | Freq: Four times a day (QID) | ORAL | 0 refills | Status: DC | PRN

## 2019-05-13 ENCOUNTER — Other Ambulatory Visit: Payer: Self-pay | Admitting: Family Medicine

## 2019-05-13 MED ORDER — HYDROCODONE-ACETAMINOPHEN 5-325 MG PO TABS: 1.0000 | ORAL_TABLET | Freq: Four times a day (QID) | ORAL | 0 refills | Status: DC | PRN

## 2019-05-14 DIAGNOSIS — E785 Hyperlipidemia, unspecified: Secondary | ICD-10-CM | POA: Diagnosis not present

## 2019-05-14 DIAGNOSIS — I10 Essential (primary) hypertension: Secondary | ICD-10-CM | POA: Diagnosis not present

## 2019-05-14 DIAGNOSIS — Z23 Encounter for immunization: Secondary | ICD-10-CM | POA: Diagnosis not present

## 2019-05-14 DIAGNOSIS — I2510 Atherosclerotic heart disease of native coronary artery without angina pectoris: Secondary | ICD-10-CM | POA: Diagnosis not present

## 2019-05-17 ENCOUNTER — Other Ambulatory Visit: Payer: Self-pay | Admitting: Family Medicine

## 2019-05-19 ENCOUNTER — Encounter: Payer: Self-pay | Admitting: Family Medicine

## 2019-05-19 DIAGNOSIS — G8929 Other chronic pain: Secondary | ICD-10-CM

## 2019-05-19 DIAGNOSIS — M545 Low back pain, unspecified: Secondary | ICD-10-CM

## 2019-05-19 DIAGNOSIS — M5137 Other intervertebral disc degeneration, lumbosacral region: Secondary | ICD-10-CM

## 2019-05-19 DIAGNOSIS — M5432 Sciatica, left side: Secondary | ICD-10-CM

## 2019-05-24 ENCOUNTER — Other Ambulatory Visit: Payer: Self-pay | Admitting: Family Medicine

## 2019-05-24 MED ORDER — HYDROCODONE-ACETAMINOPHEN 5-325 MG PO TABS: 1.0000 | ORAL_TABLET | Freq: Four times a day (QID) | ORAL | 0 refills | Status: DC | PRN

## 2019-05-28 ENCOUNTER — Other Ambulatory Visit: Payer: Self-pay | Admitting: Family Medicine

## 2019-06-04 ENCOUNTER — Other Ambulatory Visit: Payer: Self-pay | Admitting: Family Medicine

## 2019-06-04 MED ORDER — HYDROCODONE-ACETAMINOPHEN 5-325 MG PO TABS: 1.0000 | ORAL_TABLET | Freq: Four times a day (QID) | ORAL | 0 refills | Status: DC | PRN

## 2019-06-08 MED ORDER — PREDNISONE 10 MG PO TABS: ORAL_TABLET | ORAL | 0 refills | Status: DC

## 2019-06-08 MED ORDER — HYDROCODONE-ACETAMINOPHEN 5-325 MG PO TABS: 1.0000 | ORAL_TABLET | Freq: Four times a day (QID) | ORAL | 0 refills | Status: DC | PRN

## 2019-06-24 ENCOUNTER — Other Ambulatory Visit: Payer: Self-pay | Admitting: Family Medicine

## 2019-06-25 ENCOUNTER — Other Ambulatory Visit: Payer: Self-pay | Admitting: Family Medicine

## 2019-06-26 ENCOUNTER — Other Ambulatory Visit: Payer: Self-pay | Admitting: Family Medicine

## 2019-06-26 MED ORDER — HYDROCODONE-ACETAMINOPHEN 5-325 MG PO TABS: 1.0000 | ORAL_TABLET | Freq: Four times a day (QID) | ORAL | 0 refills | Status: DC | PRN

## 2019-06-30 MED ORDER — HYDROCODONE-ACETAMINOPHEN 5-325 MG PO TABS: 1.0000 | ORAL_TABLET | Freq: Four times a day (QID) | ORAL | 0 refills | Status: DC | PRN

## 2019-07-14 ENCOUNTER — Other Ambulatory Visit: Payer: Self-pay | Admitting: Family Medicine

## 2019-07-15 ENCOUNTER — Other Ambulatory Visit: Payer: Self-pay | Admitting: Family Medicine

## 2019-07-16 MED ORDER — HYDROCODONE-ACETAMINOPHEN 5-325 MG PO TABS: 1.0000 | ORAL_TABLET | Freq: Four times a day (QID) | ORAL | 0 refills | Status: DC | PRN

## 2019-07-16 MED ORDER — PREDNISONE 10 MG PO TABS: ORAL_TABLET | ORAL | 0 refills | Status: DC

## 2019-07-24 MED ORDER — HYDROCODONE-ACETAMINOPHEN 5-325 MG PO TABS: 1.0000 | ORAL_TABLET | Freq: Four times a day (QID) | ORAL | 0 refills | Status: DC | PRN

## 2019-07-28 ENCOUNTER — Other Ambulatory Visit: Payer: Self-pay | Admitting: Family Medicine

## 2019-07-28 ENCOUNTER — Ambulatory Visit: Payer: BC Managed Care – PPO | Admitting: Family Medicine

## 2019-07-28 ENCOUNTER — Other Ambulatory Visit: Payer: Self-pay

## 2019-07-28 ENCOUNTER — Encounter: Payer: Self-pay | Admitting: Family Medicine

## 2019-07-28 VITALS — BP 120/60 | HR 57 | Temp 97.1°F | Resp 16 | Wt 231.0 lb

## 2019-07-28 DIAGNOSIS — R7303 Prediabetes: Secondary | ICD-10-CM

## 2019-07-28 DIAGNOSIS — I10 Essential (primary) hypertension: Secondary | ICD-10-CM | POA: Diagnosis not present

## 2019-07-28 DIAGNOSIS — Z125 Encounter for screening for malignant neoplasm of prostate: Secondary | ICD-10-CM

## 2019-07-28 DIAGNOSIS — E782 Mixed hyperlipidemia: Secondary | ICD-10-CM

## 2019-07-28 DIAGNOSIS — E559 Vitamin D deficiency, unspecified: Secondary | ICD-10-CM | POA: Diagnosis not present

## 2019-07-28 NOTE — Progress Notes (Signed)
Patient: Isaac Hancock Male    DOB: Dec 06, 1955   64 y.o.   MRN: [id number] Visit Date: 07/28/2019  Today's Provider: Mila Merry, MD   Chief Complaint  Patient presents with  . Hyperlipidemia  . Hypertension   Subjective:     HPI  Lipid/Cholesterol, Follow-up:   Last seen for this1 years ago.  Management changes since that visit include none. . Last Lipid Panel:    Component Value Date/Time   CHOL 255 (H) 05/04/2018 0930   CHOL 252 (H) 12/01/2013 1734   TRIG 383 (H) 05/04/2018 0930   TRIG 68 12/01/2013 1734   HDL 51 05/04/2018 0930   HDL 85 (H) 12/01/2013 1734   CHOLHDL 5.0 05/04/2018 0930   CHOLHDL 7.1 (H) 05/29/2017 0828   VLDL 14 12/01/2013 1734   LDLCALC 127 (H) 05/04/2018 0930   LDLCALC  05/29/2017 0828     Comment:     . LDL cholesterol not calculated. Triglyceride levels greater than 400 mg/dL invalidate calculated LDL results. . Reference range: <100 . Desirable range <100 mg/dL for primary prevention;   <70 mg/dL for patients with CHD or diabetic patients  with > or = 2 CHD risk factors. Marland Kitchen LDL-C is now calculated using the Martin-Hopkins  calculation, which is a validated novel method providing  better accuracy than the Friedewald equation in the  estimation of LDL-C.  Horald Pollen et al. Lenox Ahr. 2706;237(62): 2061-2068  (http://education.[employer].com/faq/FAQ164)    LDLCALC 153 (H) 12/01/2013 1734    Risk factors for vascular disease include hypercholesterolemia  He reports good compliance with treatment. He is not having side effects.  Current symptoms include none and have been stable. Weight trend: increasing steadily Prior visit with dietician: no Current diet: in general, an "unhealthy" diet Current exercise: none  Wt Readings from Last 3 Encounters:  07/28/19 231 lb (104.8 kg)  08/06/18 215 lb (97.5 kg)  05/04/18 223 lb (101.2 kg)    -------------------------------------------------------------------  Hypertension,  follow-up:  BP Readings from Last 3 Encounters:  07/28/19 120/60  08/06/18 132/63  05/04/18 108/60    He was last seen for hypertension 1 years ago.  BP at that visit was 108/60. Management since that visit includes no changes. He reports good compliance with treatment. He is not having side effects.  He is not exercising. He is not adherent to low salt diet.   Outside blood pressures are not being checked. He is experiencing none.  Patient denies chest pain, chest pressure/discomfort, claudication, dyspnea, exertional chest pressure/discomfort, fatigue, irregular heart beat, lower extremity edema, near-syncope, orthopnea, palpitations, paroxysmal nocturnal dyspnea, syncope and tachypnea.   Cardiovascular risk factors include advanced age (older than 73 for men, 76 for women).  Use of agents associated with hypertension: none.     Weight trend: increasing steadily Wt Readings from Last 3 Encounters:  07/28/19 231 lb (104.8 kg)  08/06/18 215 lb (97.5 kg)  05/04/18 223 lb (101.2 kg)    Current diet: in general, an "unhealthy" diet  ------------------------------------------------------------------------  Follow up for Chronic Pain:  The patient was last seen for this 1 years ago. Changes made at last visit include none.  He reports good compliance with treatment. He feels that condition is Unchanged. He is not having side effects.  He did fall on his back a few months ago and has hard knot on right lower back now.  ------------------------------------------------------------------------------------ He is also due to follow up on low vitamin  D and pre-diabetes.  Allergies  Allergen Reactions  . Crestor [Rosuvastatin Calcium]     High LFTs     Current Outpatient Medications:  .  ALPRAZolam (XANAX) 0.5 MG tablet, Take 1 tablet (0.5 mg total) by mouth every 6 (six) hours as needed., Disp: 60 tablet, Rfl: 5 .  amLODipine (NORVASC) 10 MG tablet, , Disp: , Rfl:  .   atorvastatin (LIPITOR) 80 MG tablet, TAKE 1 TABLET DAILY, Disp: 90 tablet, Rfl: 4 .  enalapril (VASOTEC) 20 MG tablet, Take 20 mg by mouth 2 (two) times daily. , Disp: , Rfl:  .  fexofenadine (ALLEGRA) 60 MG tablet, Take 60 mg by mouth 2 (two) times daily. , Disp: , Rfl:  .  furosemide (LASIX) 20 MG tablet, Take 20 mg by mouth daily. , Disp: , Rfl:  .  HYDROcodone-acetaminophen (NORCO/VICODIN) 5-325 MG tablet, Take 1-2 tablets by mouth every 6 (six) hours as needed for moderate pain., Disp: 60 tablet, Rfl: 0 .  HYDROcodone-acetaminophen (NORCO/VICODIN) 5-325 MG tablet, Take 1-2 tablets by mouth every 6 (six) hours as needed for moderate pain., Disp: 60 tablet, Rfl: 0 .  metoprolol (TOPROL-XL) 200 MG 24 hr tablet, TAKE 1 TABLET DAILY, Disp: 90 tablet, Rfl: 3 .  MULTIPLE VITAMIN PO, Take 1 tablet by mouth daily. , Disp: , Rfl:  .  NON FORMULARY, , Disp: , Rfl:  .  omega-3 acid ethyl esters (LOVAZA) 1 g capsule, TAKE 2 CAPSULES TWICE A DAY, Disp: 360 capsule, Rfl: 4 .  omeprazole (PRILOSEC) 20 MG capsule, TAKE 1 CAPSULE DAILY, Disp: 90 capsule, Rfl: 3 .  predniSONE (DELTASONE) 10 MG tablet, TAKE 6 TABLETS BY MOUTH DAILY FOR 2 DAYS, 5 TABLETS FOR 2 DAYS, 4 TABLETS FOR 2 DAYS, 3 TABLETS FOR 2 DAYS, 2 TABLETS FOR 2 DAYS, 1 TABLET FOR 2 DAYS, Disp: 42 tablet, Rfl: 0 .  Vitamin D, Ergocalciferol, (DRISDOL) 1.25 MG (50000 UT) CAPS capsule, TAKE 1 CAPSULE ONCE WEEKLY, Disp: 12 capsule, Rfl: 3 .  warfarin (COUMADIN) 6 MG tablet, Take 6 mg daily by mouth., Disp: , Rfl:   Review of Systems  Constitutional: Negative for appetite change, chills and fever.  Respiratory: Negative for chest tightness, shortness of breath and wheezing.   Cardiovascular: Negative for chest pain and palpitations.  Gastrointestinal: Negative for abdominal pain, nausea and vomiting.    Social History   Tobacco Use  . Smoking status: Never Smoker  . Smokeless tobacco: Former Systems developer    Types: Snuff, Chew  . Tobacco comment: 2 cans  per week  Substance Use Topics  . Alcohol use: Yes    Alcohol/week: 0.0 standard drinks    Comment: 2 beers daily      Objective:   BP 120/60 (BP Location: Left Arm, Patient Position: Sitting, Cuff Size: Large)   Pulse (!) 57   Temp (!) 97.1 F (36.2 C) (Temporal)   Resp 16   Wt 231 lb (104.8 kg)   SpO2 99% Comment: room air  BMI 31.33 kg/m  Vitals:   07/28/19 0846  BP: 120/60  Pulse: (!) 57  Resp: 16  Temp: (!) 97.1 F (36.2 C)  TempSrc: Temporal  SpO2: 99%  Weight: 231 lb (104.8 kg)  Body mass index is 31.33 kg/m.   Physical Exam   General Appearance:    Obese male in no acute distress  Eyes:    PERRL, conjunctiva/corneas clear, EOM's intact       Lungs:     Clear to auscultation bilaterally, respirations unlabored  Heart:  Bradycardic. Normal rhythm. No murmurs, rubs, or gallops.  A metallic click heard  MS:   All extremities are intact.   Neurologic:   Awake, alert, oriented x 3. No apparent focal neurological           defect.          Assessment & Plan    1. Prediabetes  - HgB A1c  2. Benign essential HTN Well controlled.  Continue current medications.    3. Vitamin D deficiency  - Vitamin D (25 hydroxy)  4. Hyperlipidemia, mixed He is tolerating atorvastatin well with no adverse effects.   - Comprehensive metabolic panel - Lipid panel (fasting)  5. Prostate cancer screening  - PSA  6. Contusion right lower back. Counseled it will likely take several month for calcification to resolve.    The entirety of the information documented in the History of Present Illness, Review of Systems and Physical Exam were personally obtained by me. Portions of this information were initially documented by Awilda Bill, CMA and reviewed by me for thoroughness and accuracy.      Mila Merry, MD  [facility]

## 2019-07-29 LAB — COMPREHENSIVE METABOLIC PANEL
ALT: 29 IU/L (ref 0–44)
AST: 35 IU/L (ref 0–40)
Albumin/Globulin Ratio: 2.4 — ABNORMAL HIGH (ref 1.2–2.2)
Albumin: 4.6 g/dL (ref 3.8–4.8)
Alkaline Phosphatase: 68 IU/L (ref 39–117)
BUN/Creatinine Ratio: 15 (ref 10–24)
BUN: 16 mg/dL (ref 8–27)
Bilirubin Total: 0.4 mg/dL (ref 0.0–1.2)
CO2: 27 mmol/L (ref 20–29)
Calcium: 9.2 mg/dL (ref 8.6–10.2)
Chloride: 91 mmol/L — ABNORMAL LOW (ref 96–106)
Creatinine, Ser: 1.06 mg/dL (ref 0.76–1.27)
GFR calc Af Amer: 87 mL/min/{1.73_m2} (ref >59)
GFR calc non Af Amer: 75 mL/min/{1.73_m2} (ref >59)
Globulin, Total: 1.9 g/dL (ref 1.5–4.5)
Glucose: 84 mg/dL (ref 65–99)
Potassium: 4.2 mmol/L (ref 3.5–5.2)
Sodium: 136 mmol/L (ref 134–144)
Total Protein: 6.5 g/dL (ref 6.0–8.5)

## 2019-07-29 LAB — PSA: Prostate Specific Ag, Serum: 0.4 ng/mL (ref 0.0–4.0)

## 2019-07-29 LAB — LIPID PANEL
Chol/HDL Ratio: 2.2 ratio (ref 0.0–5.0)
Cholesterol, Total: 261 mg/dL — ABNORMAL HIGH (ref 100–199)
HDL: 119 mg/dL (ref >39)
LDL Chol Calc (NIH): 121 mg/dL — ABNORMAL HIGH (ref 0–99)
Triglycerides: 128 mg/dL (ref 0–149)
VLDL Cholesterol Cal: 21 mg/dL (ref 5–40)

## 2019-07-29 LAB — VITAMIN D 25 HYDROXY (VIT D DEFICIENCY, FRACTURES): Vit D, 25-Hydroxy: 39.8 ng/mL (ref 30.0–100.0)

## 2019-07-29 LAB — HEMOGLOBIN A1C
Est. average glucose Bld gHb Est-mCnc: 114 mg/dL
Hgb A1c MFr Bld: 5.6 % (ref 4.8–5.6)

## 2019-07-30 ENCOUNTER — Other Ambulatory Visit: Payer: Self-pay | Admitting: Family Medicine

## 2019-07-30 MED ORDER — HYDROCODONE-ACETAMINOPHEN 5-325 MG PO TABS: 1.0000 | ORAL_TABLET | Freq: Four times a day (QID) | ORAL | 0 refills | Status: DC | PRN

## 2019-08-03 DIAGNOSIS — I2510 Atherosclerotic heart disease of native coronary artery without angina pectoris: Secondary | ICD-10-CM | POA: Diagnosis not present

## 2019-08-07 ENCOUNTER — Other Ambulatory Visit: Payer: Self-pay | Admitting: Family Medicine

## 2019-08-10 MED ORDER — HYDROCODONE-ACETAMINOPHEN 5-325 MG PO TABS: 1.0000 | ORAL_TABLET | Freq: Four times a day (QID) | ORAL | 0 refills | Status: DC | PRN

## 2019-08-15 ENCOUNTER — Other Ambulatory Visit: Payer: Self-pay | Admitting: Family Medicine

## 2019-08-17 ENCOUNTER — Other Ambulatory Visit: Payer: Self-pay | Admitting: Family Medicine

## 2019-08-18 MED ORDER — PREDNISONE 10 MG PO TABS: ORAL_TABLET | ORAL | 0 refills | Status: DC

## 2019-08-18 MED ORDER — HYDROCODONE-ACETAMINOPHEN 5-325 MG PO TABS: 1.0000 | ORAL_TABLET | Freq: Four times a day (QID) | ORAL | 0 refills | Status: DC | PRN

## 2019-08-20 DIAGNOSIS — I2510 Atherosclerotic heart disease of native coronary artery without angina pectoris: Secondary | ICD-10-CM | POA: Diagnosis not present

## 2019-08-24 ENCOUNTER — Other Ambulatory Visit: Payer: Self-pay | Admitting: Family Medicine

## 2019-08-25 MED ORDER — HYDROCODONE-ACETAMINOPHEN 5-325 MG PO TABS: 1.0000 | ORAL_TABLET | Freq: Four times a day (QID) | ORAL | 0 refills | Status: DC | PRN

## 2019-08-26 ENCOUNTER — Other Ambulatory Visit: Payer: Self-pay | Admitting: Family Medicine

## 2019-09-01 ENCOUNTER — Other Ambulatory Visit: Payer: Self-pay | Admitting: Family Medicine

## 2019-09-01 MED ORDER — HYDROCODONE-ACETAMINOPHEN 5-325 MG PO TABS: 1.0000 | ORAL_TABLET | Freq: Four times a day (QID) | ORAL | 0 refills | Status: DC | PRN

## 2019-09-01 NOTE — Telephone Encounter (Signed)
Please advise 

## 2019-09-06 ENCOUNTER — Other Ambulatory Visit: Payer: Self-pay | Admitting: Family Medicine

## 2019-09-13 ENCOUNTER — Other Ambulatory Visit: Payer: Self-pay | Admitting: Family Medicine

## 2019-09-13 ENCOUNTER — Other Ambulatory Visit: Payer: Self-pay | Admitting: Physician Assistant

## 2019-09-13 MED ORDER — HYDROCODONE-ACETAMINOPHEN 5-325 MG PO TABS: 1.0000 | ORAL_TABLET | Freq: Four times a day (QID) | ORAL | 0 refills | Status: DC | PRN

## 2019-09-14 MED ORDER — PREDNISONE 10 MG PO TABS: ORAL_TABLET | ORAL | 0 refills | Status: DC

## 2019-09-20 ENCOUNTER — Other Ambulatory Visit: Payer: Self-pay | Admitting: Family Medicine

## 2019-09-20 NOTE — Telephone Encounter (Signed)
Requested Prescriptions  Pending Prescriptions Disp Refills  . omega-3 acid ethyl esters (LOVAZA) 1 g capsule [Pharmacy Med Name: OMEGA 3 ACID ETHYL ESTERS CAPS 1GM] 360 capsule 3    Sig: TAKE 2 CAPSULES TWICE A DAY     Endocrinology:  Nutritional Agents Passed - 09/20/2019  6:46 PM      Passed - Valid encounter within last 12 months    Recent Outpatient Visits          1 month ago Prostate cancer screening   [facility] Malva Limes, MD   8 months ago Need for shingles vaccine   [facility] [place], [place], [place]   1 year ago Hyperlipidemia, mixed   [facility] Malva Limes, MD   1 year ago Hyperlipidemia, mixed   [facility] Malva Limes, MD   1 year ago Benign essential HTN   [facility] Malva Limes, MD

## 2019-09-22 ENCOUNTER — Encounter: Payer: Self-pay | Admitting: Family Medicine

## 2019-09-24 ENCOUNTER — Other Ambulatory Visit: Payer: Self-pay | Admitting: Family Medicine

## 2019-09-25 MED ORDER — HYDROCODONE-ACETAMINOPHEN 5-325 MG PO TABS: 1.0000 | ORAL_TABLET | Freq: Four times a day (QID) | ORAL | 0 refills | Status: DC | PRN

## 2019-10-06 ENCOUNTER — Other Ambulatory Visit: Payer: Self-pay | Admitting: Family Medicine

## 2019-10-06 MED ORDER — HYDROCODONE-ACETAMINOPHEN 5-325 MG PO TABS: 1.0000 | ORAL_TABLET | Freq: Four times a day (QID) | ORAL | 0 refills | Status: DC | PRN

## 2019-10-06 NOTE — Telephone Encounter (Signed)
LOV  07/28/19  LRF Hydrocodone  09/25/19  # 60 LRF Prednisone  09/14/19  # 42

## 2019-10-08 DIAGNOSIS — I2510 Atherosclerotic heart disease of native coronary artery without angina pectoris: Secondary | ICD-10-CM | POA: Diagnosis not present

## 2019-10-15 ENCOUNTER — Other Ambulatory Visit: Payer: Self-pay | Admitting: Family Medicine

## 2019-10-18 ENCOUNTER — Other Ambulatory Visit: Payer: Self-pay | Admitting: Family Medicine

## 2019-10-18 MED ORDER — PREDNISONE 10 MG PO TABS: ORAL_TABLET | ORAL | 0 refills | Status: DC

## 2019-10-18 MED ORDER — HYDROCODONE-ACETAMINOPHEN 5-325 MG PO TABS: 1.0000 | ORAL_TABLET | Freq: Four times a day (QID) | ORAL | 0 refills | Status: DC | PRN

## 2019-10-29 ENCOUNTER — Other Ambulatory Visit: Payer: Self-pay | Admitting: Family Medicine

## 2019-10-30 ENCOUNTER — Other Ambulatory Visit: Payer: Self-pay | Admitting: Family Medicine

## 2019-10-31 MED ORDER — HYDROCODONE-ACETAMINOPHEN 5-325 MG PO TABS: 1.0000 | ORAL_TABLET | Freq: Four times a day (QID) | ORAL | 0 refills | Status: DC | PRN

## 2019-11-09 DIAGNOSIS — Z952 Presence of prosthetic heart valve: Secondary | ICD-10-CM | POA: Diagnosis not present

## 2019-11-11 ENCOUNTER — Other Ambulatory Visit: Payer: Self-pay | Admitting: Family Medicine

## 2019-11-12 DIAGNOSIS — I10 Essential (primary) hypertension: Secondary | ICD-10-CM | POA: Diagnosis not present

## 2019-11-12 DIAGNOSIS — E785 Hyperlipidemia, unspecified: Secondary | ICD-10-CM | POA: Diagnosis not present

## 2019-11-12 DIAGNOSIS — I2510 Atherosclerotic heart disease of native coronary artery without angina pectoris: Secondary | ICD-10-CM | POA: Diagnosis not present

## 2019-11-12 DIAGNOSIS — Z952 Presence of prosthetic heart valve: Secondary | ICD-10-CM | POA: Diagnosis not present

## 2019-11-12 MED ORDER — HYDROCODONE-ACETAMINOPHEN 5-325 MG PO TABS: 1.0000 | ORAL_TABLET | Freq: Four times a day (QID) | ORAL | 0 refills | Status: DC | PRN

## 2019-11-15 ENCOUNTER — Other Ambulatory Visit: Payer: Self-pay | Admitting: Family Medicine

## 2019-11-16 MED ORDER — PREDNISONE 10 MG PO TABS: ORAL_TABLET | ORAL | 0 refills | Status: DC

## 2019-11-24 ENCOUNTER — Other Ambulatory Visit: Payer: Self-pay | Admitting: Family Medicine

## 2019-11-24 MED ORDER — HYDROCODONE-ACETAMINOPHEN 5-325 MG PO TABS: 1.0000 | ORAL_TABLET | Freq: Four times a day (QID) | ORAL | 0 refills | Status: DC | PRN

## 2019-11-26 ENCOUNTER — Encounter: Payer: Self-pay | Admitting: Family Medicine

## 2019-11-26 DIAGNOSIS — F419 Anxiety disorder, unspecified: Secondary | ICD-10-CM

## 2019-11-29 MED ORDER — ALPRAZOLAM 0.5 MG PO TABS: 0.5000 mg | ORAL_TABLET | Freq: Four times a day (QID) | ORAL | 5 refills | Status: DC | PRN

## 2019-12-06 ENCOUNTER — Other Ambulatory Visit: Payer: Self-pay | Admitting: Family Medicine

## 2019-12-06 MED ORDER — HYDROCODONE-ACETAMINOPHEN 5-325 MG PO TABS: 1.0000 | ORAL_TABLET | Freq: Four times a day (QID) | ORAL | 0 refills | Status: DC | PRN

## 2019-12-07 DIAGNOSIS — I2510 Atherosclerotic heart disease of native coronary artery without angina pectoris: Secondary | ICD-10-CM | POA: Diagnosis not present

## 2019-12-12 ENCOUNTER — Other Ambulatory Visit: Payer: Self-pay | Admitting: Family Medicine

## 2019-12-12 NOTE — Telephone Encounter (Signed)
Requested Prescriptions  Pending Prescriptions Disp Refills  . omeprazole (PRILOSEC) 20 MG capsule [Pharmacy Med Name: OMEPRAZOLE DR CAPS 20MG ] 90 capsule 2    Sig: TAKE 1 CAPSULE DAILY     Gastroenterology: Proton Pump Inhibitors Passed - 12/12/2019  4:25 PM      Passed - Valid encounter within last 12 months    Recent Outpatient Visits          4 months ago Prostate cancer screening   [facility], [place]   10 months ago Need for shingles vaccine   [facility], [place], [place]   1 year ago Hyperlipidemia, mixed   [facility], [place]   1 year ago Hyperlipidemia, mixed   [facility], MD   2 years ago Benign essential HTN   [facility], [place]

## 2019-12-15 ENCOUNTER — Other Ambulatory Visit: Payer: Self-pay | Admitting: Family Medicine

## 2019-12-16 ENCOUNTER — Other Ambulatory Visit: Payer: Self-pay | Admitting: Family Medicine

## 2019-12-16 MED ORDER — HYDROCODONE-ACETAMINOPHEN 5-325 MG PO TABS: 1.0000 | ORAL_TABLET | Freq: Four times a day (QID) | ORAL | 0 refills | Status: DC | PRN

## 2019-12-16 MED ORDER — PREDNISONE 10 MG PO TABS: ORAL_TABLET | ORAL | 0 refills | Status: DC

## 2019-12-23 DIAGNOSIS — I2510 Atherosclerotic heart disease of native coronary artery without angina pectoris: Secondary | ICD-10-CM | POA: Diagnosis not present

## 2019-12-29 ENCOUNTER — Other Ambulatory Visit: Payer: Self-pay | Admitting: Family Medicine

## 2019-12-29 MED ORDER — HYDROCODONE-ACETAMINOPHEN 5-325 MG PO TABS: 1.0000 | ORAL_TABLET | Freq: Four times a day (QID) | ORAL | 0 refills | Status: DC | PRN

## 2020-01-07 ENCOUNTER — Other Ambulatory Visit: Payer: Self-pay | Admitting: Family Medicine

## 2020-01-10 MED ORDER — HYDROCODONE-ACETAMINOPHEN 5-325 MG PO TABS: 1.0000 | ORAL_TABLET | Freq: Four times a day (QID) | ORAL | 0 refills | Status: DC | PRN

## 2020-01-11 ENCOUNTER — Other Ambulatory Visit: Payer: Self-pay | Admitting: Family Medicine

## 2020-01-14 ENCOUNTER — Other Ambulatory Visit: Payer: Self-pay | Admitting: Family Medicine

## 2020-01-15 MED ORDER — PREDNISONE 10 MG PO TABS: ORAL_TABLET | ORAL | 0 refills | Status: DC

## 2020-01-21 ENCOUNTER — Other Ambulatory Visit: Payer: Self-pay | Admitting: Family Medicine

## 2020-01-22 ENCOUNTER — Other Ambulatory Visit: Payer: Self-pay | Admitting: Family Medicine

## 2020-01-24 MED ORDER — HYDROCODONE-ACETAMINOPHEN 5-325 MG PO TABS: 1.0000 | ORAL_TABLET | Freq: Four times a day (QID) | ORAL | 0 refills | Status: DC | PRN

## 2020-02-03 ENCOUNTER — Other Ambulatory Visit: Payer: Self-pay | Admitting: Family Medicine

## 2020-02-04 MED ORDER — HYDROCODONE-ACETAMINOPHEN 5-325 MG PO TABS: 1.0000 | ORAL_TABLET | Freq: Four times a day (QID) | ORAL | 0 refills | Status: DC | PRN

## 2020-02-11 ENCOUNTER — Other Ambulatory Visit: Payer: Self-pay | Admitting: Family Medicine

## 2020-02-14 MED ORDER — PREDNISONE 10 MG PO TABS: ORAL_TABLET | ORAL | 0 refills | Status: DC

## 2020-02-17 ENCOUNTER — Other Ambulatory Visit: Payer: Self-pay | Admitting: Family Medicine

## 2020-02-18 MED ORDER — HYDROCODONE-ACETAMINOPHEN 5-325 MG PO TABS: 1.0000 | ORAL_TABLET | Freq: Four times a day (QID) | ORAL | 0 refills | Status: DC | PRN

## 2020-03-02 ENCOUNTER — Other Ambulatory Visit: Payer: Self-pay | Admitting: Family Medicine

## 2020-03-03 MED ORDER — HYDROCODONE-ACETAMINOPHEN 5-325 MG PO TABS: 1.0000 | ORAL_TABLET | Freq: Four times a day (QID) | ORAL | 0 refills | Status: DC | PRN

## 2020-03-09 ENCOUNTER — Other Ambulatory Visit: Payer: Self-pay | Admitting: Family Medicine

## 2020-03-13 DIAGNOSIS — I2510 Atherosclerotic heart disease of native coronary artery without angina pectoris: Secondary | ICD-10-CM | POA: Diagnosis not present

## 2020-03-14 MED ORDER — PREDNISONE 10 MG PO TABS
ORAL_TABLET | ORAL | 0 refills | Status: DC
Start: 2020-03-14 — End: 2020-04-12

## 2020-03-15 ENCOUNTER — Other Ambulatory Visit: Payer: Self-pay | Admitting: Family Medicine

## 2020-03-15 MED ORDER — HYDROCODONE-ACETAMINOPHEN 5-325 MG PO TABS
1.0000 | ORAL_TABLET | Freq: Four times a day (QID) | ORAL | 0 refills | Status: DC | PRN
Start: 2020-03-15 — End: 2020-03-24

## 2020-03-24 ENCOUNTER — Other Ambulatory Visit: Payer: Self-pay | Admitting: Family Medicine

## 2020-03-24 DIAGNOSIS — M5137 Other intervertebral disc degeneration, lumbosacral region: Secondary | ICD-10-CM

## 2020-03-26 ENCOUNTER — Other Ambulatory Visit: Payer: Self-pay | Admitting: Family Medicine

## 2020-03-26 NOTE — Telephone Encounter (Signed)
Requested medication (s) are due for refill today: yes  Requested medication (s) are on the active medication list: yes  Last refill:  04/04/19  Future visit scheduled: no  Notes to clinic:  med not delegated to NT to RF   Requested Prescriptions  Pending Prescriptions Disp Refills   Vitamin D, Ergocalciferol, (DRISDOL) 1.25 MG (50000 UNIT) CAPS capsule [Pharmacy Med Name: VIT D-2 CAP(ERGOCAL)1.25MG  50,000U] 12 capsule 3    Sig: TAKE 1 CAPSULE ONCE WEEKLY      Endocrinology:  Vitamins - Vitamin D Supplementation Failed - 03/26/2020  3:34 PM      Failed - 50,000 IU strengths are not delegated      Failed - Phosphate in normal range and within 360 days    Phosphorus  Date Value Ref Range Status  07/11/2015 5.7 (H) 2.5 - 4.5 mg/dL Final          Passed - Ca in normal range and within 360 days    Calcium  Date Value Ref Range Status  07/28/2019 9.2 8.6 - 10.2 mg/dL Final   Calcium, Total  Date Value Ref Range Status  12/01/2013 8.2 (L) 8.5 - 10.1 mg/dL Final          Passed - Vitamin D in normal range and within 360 days    Vit D, 25-Hydroxy  Date Value Ref Range Status  07/28/2019 39.8 30.0 - 100.0 ng/mL Final    Comment:    Vitamin D deficiency has been defined by the Institute of Medicine and an Endocrine Society practice guideline as a level of serum 25-OH vitamin D less than 20 ng/mL (1,2). The Endocrine Society went on to further define vitamin D insufficiency as a level between 21 and 29 ng/mL (2). 1. IOM (Institute of Medicine). 2010. Dietary reference    intakes for calcium and D. [place]: The    [employer]. 2. Holick MF, Binkley [place], Bischoff-Ferrari HA, et al.    Evaluation, treatment, and prevention of vitamin D    deficiency: an Endocrine Society clinical practice    guideline. JCEM. 2011 Jul; 96(7):1911-30.           Passed - Valid encounter within last 12 months    Recent Outpatient Visits           8 months ago Prostate  cancer screening   [facility] Malva Limes, MD   1 year ago Need for shingles vaccine   [facility] Trey Sailors, PA-C   1 year ago Hyperlipidemia, mixed   [facility] Malva Limes, MD   2 years ago Hyperlipidemia, mixed   [facility] Malva Limes, MD   2 years ago Benign essential HTN   [facility] Malva Limes, MD

## 2020-03-27 MED ORDER — HYDROCODONE-ACETAMINOPHEN 5-325 MG PO TABS: 1.0000 | ORAL_TABLET | Freq: Four times a day (QID) | ORAL | 0 refills | Status: DC | PRN

## 2020-04-07 ENCOUNTER — Other Ambulatory Visit: Payer: Self-pay | Admitting: Family Medicine

## 2020-04-07 DIAGNOSIS — M5137 Other intervertebral disc degeneration, lumbosacral region: Secondary | ICD-10-CM

## 2020-04-10 MED ORDER — HYDROCODONE-ACETAMINOPHEN 5-325 MG PO TABS: 1.0000 | ORAL_TABLET | Freq: Four times a day (QID) | ORAL | 0 refills | Status: DC | PRN

## 2020-04-12 ENCOUNTER — Other Ambulatory Visit: Payer: Self-pay | Admitting: Family Medicine

## 2020-04-13 MED ORDER — PREDNISONE 10 MG PO TABS
ORAL_TABLET | ORAL | 0 refills | Status: DC
Start: 2020-04-13 — End: 2020-05-11

## 2020-04-26 ENCOUNTER — Other Ambulatory Visit: Payer: Self-pay | Admitting: Family Medicine

## 2020-04-26 DIAGNOSIS — M5137 Other intervertebral disc degeneration, lumbosacral region: Secondary | ICD-10-CM

## 2020-04-26 MED ORDER — HYDROCODONE-ACETAMINOPHEN 5-325 MG PO TABS: 1.0000 | ORAL_TABLET | Freq: Four times a day (QID) | ORAL | 0 refills | Status: DC | PRN

## 2020-05-08 ENCOUNTER — Other Ambulatory Visit: Payer: Self-pay | Admitting: Family Medicine

## 2020-05-08 DIAGNOSIS — M5137 Other intervertebral disc degeneration, lumbosacral region: Secondary | ICD-10-CM

## 2020-05-09 MED ORDER — HYDROCODONE-ACETAMINOPHEN 5-325 MG PO TABS
1.0000 | ORAL_TABLET | Freq: Four times a day (QID) | ORAL | 0 refills | Status: DC | PRN
Start: 2020-05-09 — End: 2020-05-23

## 2020-05-11 ENCOUNTER — Other Ambulatory Visit: Payer: Self-pay | Admitting: Family Medicine

## 2020-05-12 MED ORDER — PREDNISONE 10 MG PO TABS
ORAL_TABLET | ORAL | 0 refills | Status: DC
Start: 2020-05-12 — End: 2020-06-11

## 2020-05-15 DIAGNOSIS — I10 Essential (primary) hypertension: Secondary | ICD-10-CM | POA: Diagnosis not present

## 2020-05-15 DIAGNOSIS — E785 Hyperlipidemia, unspecified: Secondary | ICD-10-CM | POA: Diagnosis not present

## 2020-05-15 DIAGNOSIS — I2510 Atherosclerotic heart disease of native coronary artery without angina pectoris: Secondary | ICD-10-CM | POA: Diagnosis not present

## 2020-05-15 DIAGNOSIS — Z952 Presence of prosthetic heart valve: Secondary | ICD-10-CM | POA: Diagnosis not present

## 2020-05-15 DIAGNOSIS — I350 Nonrheumatic aortic (valve) stenosis: Secondary | ICD-10-CM | POA: Diagnosis not present

## 2020-05-23 ENCOUNTER — Other Ambulatory Visit: Payer: Self-pay | Admitting: Family Medicine

## 2020-05-23 DIAGNOSIS — M5137 Other intervertebral disc degeneration, lumbosacral region: Secondary | ICD-10-CM

## 2020-05-24 MED ORDER — HYDROCODONE-ACETAMINOPHEN 5-325 MG PO TABS: 1.0000 | ORAL_TABLET | Freq: Four times a day (QID) | ORAL | 0 refills | Status: DC | PRN

## 2020-06-05 ENCOUNTER — Other Ambulatory Visit: Payer: Self-pay | Admitting: Family Medicine

## 2020-06-05 DIAGNOSIS — M5137 Other intervertebral disc degeneration, lumbosacral region: Secondary | ICD-10-CM

## 2020-06-05 MED ORDER — HYDROCODONE-ACETAMINOPHEN 5-325 MG PO TABS
1.0000 | ORAL_TABLET | Freq: Four times a day (QID) | ORAL | 0 refills | Status: DC | PRN
Start: 2020-06-05 — End: 2020-06-18

## 2020-06-11 ENCOUNTER — Other Ambulatory Visit: Payer: Self-pay | Admitting: Family Medicine

## 2020-06-13 MED ORDER — PREDNISONE 10 MG PO TABS
ORAL_TABLET | ORAL | 0 refills | Status: DC
Start: 2020-06-13 — End: 2020-07-12

## 2020-06-14 DIAGNOSIS — I2510 Atherosclerotic heart disease of native coronary artery without angina pectoris: Secondary | ICD-10-CM | POA: Diagnosis not present

## 2020-06-18 ENCOUNTER — Other Ambulatory Visit: Payer: Self-pay | Admitting: Family Medicine

## 2020-06-18 DIAGNOSIS — M5137 Other intervertebral disc degeneration, lumbosacral region: Secondary | ICD-10-CM

## 2020-06-19 MED ORDER — HYDROCODONE-ACETAMINOPHEN 5-325 MG PO TABS: 1.0000 | ORAL_TABLET | Freq: Four times a day (QID) | ORAL | 0 refills | Status: DC | PRN

## 2020-06-30 ENCOUNTER — Other Ambulatory Visit: Payer: Self-pay | Admitting: Family Medicine

## 2020-06-30 ENCOUNTER — Ambulatory Visit: Payer: Self-pay | Admitting: *Deleted

## 2020-06-30 DIAGNOSIS — M5137 Other intervertebral disc degeneration, lumbosacral region: Secondary | ICD-10-CM

## 2020-06-30 DIAGNOSIS — M10031 Idiopathic gout, right wrist: Secondary | ICD-10-CM | POA: Diagnosis not present

## 2020-06-30 MED ORDER — HYDROCODONE-ACETAMINOPHEN 5-325 MG PO TABS: 1.0000 | ORAL_TABLET | Freq: Four times a day (QID) | ORAL | 0 refills | Status: DC | PRN

## 2020-06-30 NOTE — Telephone Encounter (Signed)
Pt called in c/o his right wrist being swollen and painful since he woke up Wed. Morning.   Denies injuries, falls, gout.   He has had carpel tunnel a couple of years ago or so and had surgery and has done fine since. His whole wrist and 1/2 of his hand is swollen.   "I can't even hold a pencil".   It feels similar to carpel tunnel".  There are no openings today at [facility] plus the office closes today at 11:30.     I referred him to the urgent care which he was agreeable to going.   Reason for Disposition . [1] SEVERE pain (e.g., excruciating, unable to use hand or wrist at all) AND [2] not improved after 2 hours of pain medicine  Answer Assessment - Initial Assessment Questions 1. ONSET: "When did the swelling start?" (e.g., minutes, hours, days, weeks)     Swelling in my RIght wrist.   Woke up Wed. Morning with my wrist swollen.  It's so swollen I can't see my wrist bones. I've had carpel tunnel and had surgery and been fine since then.   Maybe a couple of yrs. Ago. No gout. 2. LOCATION: "What part of the wrist is swollen?"  "Are both wrists swollen or just one wrist?"     My wrist knuckles are swollen.  3. SEVERITY: "How bad is the swelling?"    - BALL OR LUMP: small ball or lump   - SKIN ONLY: localized; puffy or swollen area or patch of skin   - MILD JOINT SWELLING: joint feels or looks mildly swollen or puffy   - MODERATE JOINT SWELLING: moderate joint swelling; looks swollen   - SEVERE JOINT SWELLING:  severe joint swelling; can barely bend or move joint     I'm having a lot of pain in my wrist and it's hard to move. 4. RECURRENT SYMPTOM: "Have you had wrist swelling before?" If Yes, ask: "When was the last time?" "What happened that time?"     Yes with carpel tunnel but been fine since surgery. 5. CAUSE: "What do you think is causing the wrist swelling?" (e.g., arthritis, ganglion cyst, insect bite, recent injury)     I don't know 6. OTHER SYMPTOMS: "Do you  have any other symptoms?" (e.g., fever, hand pain)     No fever 7. PREGNANCY: "Is there any chance you are pregnant?" "When was your last menstrual period?"     N/A  Protocols used: [facility]

## 2020-07-07 ENCOUNTER — Other Ambulatory Visit: Payer: Self-pay | Admitting: Family Medicine

## 2020-07-12 ENCOUNTER — Encounter: Payer: Self-pay | Admitting: Family Medicine

## 2020-07-12 ENCOUNTER — Other Ambulatory Visit: Payer: Self-pay | Admitting: Family Medicine

## 2020-07-12 DIAGNOSIS — F419 Anxiety disorder, unspecified: Secondary | ICD-10-CM

## 2020-07-12 DIAGNOSIS — M5137 Other intervertebral disc degeneration, lumbosacral region: Secondary | ICD-10-CM

## 2020-07-12 MED ORDER — ALPRAZOLAM 0.5 MG PO TABS: 0.5000 mg | ORAL_TABLET | Freq: Four times a day (QID) | ORAL | 5 refills | Status: DC | PRN

## 2020-07-12 MED ORDER — HYDROCODONE-ACETAMINOPHEN 5-325 MG PO TABS: 1.0000 | ORAL_TABLET | Freq: Four times a day (QID) | ORAL | 0 refills | Status: DC | PRN

## 2020-07-12 MED ORDER — PREDNISONE 10 MG PO TABS
ORAL_TABLET | ORAL | 0 refills | Status: DC
Start: 2020-07-12 — End: 2020-08-08

## 2020-07-24 ENCOUNTER — Other Ambulatory Visit: Payer: Self-pay | Admitting: Family Medicine

## 2020-07-24 DIAGNOSIS — M5137 Other intervertebral disc degeneration, lumbosacral region: Secondary | ICD-10-CM

## 2020-07-24 MED ORDER — HYDROCODONE-ACETAMINOPHEN 5-325 MG PO TABS: 1.0000 | ORAL_TABLET | Freq: Four times a day (QID) | ORAL | 0 refills | Status: DC | PRN

## 2020-08-08 ENCOUNTER — Other Ambulatory Visit: Payer: Self-pay | Admitting: Family Medicine

## 2020-08-08 DIAGNOSIS — M5137 Other intervertebral disc degeneration, lumbosacral region: Secondary | ICD-10-CM

## 2020-08-08 MED ORDER — PREDNISONE 10 MG PO TABS
ORAL_TABLET | ORAL | 0 refills | Status: DC
Start: 2020-08-08 — End: 2020-09-05

## 2020-08-08 MED ORDER — HYDROCODONE-ACETAMINOPHEN 5-325 MG PO TABS: 1.0000 | ORAL_TABLET | Freq: Four times a day (QID) | ORAL | 0 refills | Status: DC | PRN

## 2020-08-20 ENCOUNTER — Other Ambulatory Visit: Payer: Self-pay | Admitting: Family Medicine

## 2020-08-20 DIAGNOSIS — M5137 Other intervertebral disc degeneration, lumbosacral region: Secondary | ICD-10-CM

## 2020-08-20 NOTE — Telephone Encounter (Signed)
Requested medication (s) are due for refill today: yes  Requested medication (s) are on the active medication list: yes  Last refill:  05/22/2020  Future visit scheduled: no  Notes to clinic:  overdue for follow up appointment  Patient aware that he needs appt     Requested Prescriptions  Pending Prescriptions Disp Refills   atorvastatin (LIPITOR) 80 MG tablet [Pharmacy Med Name: ATORVASTATIN TABS 80MG ] 90 tablet 3    Sig: TAKE 1 TABLET DAILY      Cardiovascular:  Antilipid - Statins Failed - 08/20/2020  2:52 PM      Failed - Total Cholesterol in normal range and within 360 days    Cholesterol, Total  Date Value Ref Range Status  07/28/2019 261 (H) 100 - 199 mg/dL Final   Cholesterol  Date Value Ref Range Status  12/01/2013 252 (H) 0 - 200 mg/dL Final          Failed - LDL in normal range and within 360 days    Ldl Cholesterol, Calc  Date Value Ref Range Status  12/01/2013 153 (H) 0 - 100 mg/dL Final   LDL Cholesterol (Calc)  Date Value Ref Range Status  05/29/2017  mg/dL (calc) Final    Comment:    . LDL cholesterol not calculated. Triglyceride levels greater than 400 mg/dL invalidate calculated LDL results. . Reference range: <100 . Desirable range <100 mg/dL for primary prevention;   <70 mg/dL for patients with CHD or diabetic patients  with > or = 2 CHD risk factors. 05/31/2017 LDL-C is now calculated using the Martin-Hopkins  calculation, which is a validated novel method providing  better accuracy than the Friedewald equation in the  estimation of LDL-C.  Marland Kitchen et al. Horald Pollen. Lenox Ahr): 2061-2068  (http://education.[employer].com/faq/FAQ164)    LDL Chol Calc (NIH)  Date Value Ref Range Status  07/28/2019 121 (H) 0 - 99 mg/dL Final          Failed - HDL in normal range and within 360 days    HDL Cholesterol  Date Value Ref Range Status  12/01/2013 85 (H) 40 - 60 mg/dL Final   HDL  Date Value Ref Range Status  07/28/2019 119 >39 mg/dL Final           Failed - Triglycerides in normal range and within 360 days    Triglycerides  Date Value Ref Range Status  07/28/2019 128 0 - 149 mg/dL Final  07/30/2019 68 0 - 200 mg/dL Final          Failed - Valid encounter within last 12 months    Recent Outpatient Visits           1 year ago Prostate cancer screening   [facility], MD   1 year ago Need for shingles vaccine   [facility] M, M   2 years ago Hyperlipidemia, mixed   [facility], MD   2 years ago Hyperlipidemia, mixed   [facility], MD   2 years ago Benign essential HTN   [facility], MD                Passed - Patient is not pregnant

## 2020-08-21 MED ORDER — ATORVASTATIN CALCIUM 80 MG PO TABS: 80.0000 mg | ORAL_TABLET | Freq: Every day | ORAL | 3 refills | Status: DC

## 2020-08-21 MED ORDER — HYDROCODONE-ACETAMINOPHEN 5-325 MG PO TABS: 1.0000 | ORAL_TABLET | Freq: Four times a day (QID) | ORAL | 0 refills | Status: DC | PRN

## 2020-08-24 ENCOUNTER — Telehealth: Payer: Self-pay | Admitting: Physician Assistant

## 2020-08-24 ENCOUNTER — Other Ambulatory Visit: Payer: Self-pay

## 2020-08-24 ENCOUNTER — Ambulatory Visit: Payer: BC Managed Care – PPO | Admitting: Physician Assistant

## 2020-08-24 VITALS — BP 111/48 | HR 70 | Temp 98.5°F | Ht 68.0 in | Wt 228.0 lb

## 2020-08-24 DIAGNOSIS — R21 Rash and other nonspecific skin eruption: Secondary | ICD-10-CM

## 2020-08-24 DIAGNOSIS — L409 Psoriasis, unspecified: Secondary | ICD-10-CM | POA: Diagnosis not present

## 2020-08-24 NOTE — Telephone Encounter (Signed)
Can we call patient and let him know that any antifungal medication interacts with his warfarin in a way that puts him at risk for bleeding? I am hesitant to send this in for him especially think I believe his rash is due to stopping long term prednisone rather abruptly. I think his best option will be to see the dermatologist on a more urgent basis.

## 2020-08-24 NOTE — Progress Notes (Signed)
Established patient visit   Patient: Isaac Hancock   DOB: [birth year]   65 y.o. Male  MRN: [id number] Visit Date: 08/24/2020  Today's healthcare provider: Trey Sailors, PA-C   Chief Complaint  Patient presents with  . Rash   Subjective    Rash   Rash: Patient with history of intermittent chronic use of prednisone since 2019 complains of rash involving the generalized. Rash started 2 weeks ago. Appearance of rash at onset: Other appearance: scaly surrounded by red hue. Rash has not changed over time Initial distribution: generalized.  Discomfort associated with rash: N/A.  Associated symptoms: itching. Denies: other symptoms. Patient has not had previous evaluation of rash. Patient has not had previous treatment.  Response to treatment: N/A. Patient has not had contacts with similar rash. Patient has not identified precipitant. Patient has not had new exposures (soaps, lotions, laundry detergents, foods, medications, plants, insects or animals.) last Rx of prednisone was 07/12/2020 for 12 day taper. Patient got another prescription of prednisone on 08/08/2020. Rash emerged three weeks prior to day, in between prescriptions. Rash did not improve on prednisone. Patient does not have a history of psoriasis.       Medications: Outpatient Medications Prior to Visit  Medication Sig  . ALPRAZolam (XANAX) 0.5 MG tablet Take 1 tablet (0.5 mg total) by mouth every 6 (six) hours as needed.  Marland Kitchen amLODipine (NORVASC) 10 MG tablet   . atorvastatin (LIPITOR) 80 MG tablet Take 1 tablet (80 mg total) by mouth daily.  . enalapril (VASOTEC) 20 MG tablet Take 20 mg by mouth 2 (two) times daily.   . fexofenadine (ALLEGRA) 60 MG tablet Take 60 mg by mouth 2 (two) times daily.   . furosemide (LASIX) 20 MG tablet Take 20 mg by mouth daily.   Marland Kitchen HYDROcodone-acetaminophen (NORCO/VICODIN) 5-325 MG tablet Take 1-2 tablets by mouth every 6 (six) hours as needed for moderate pain.  . metoprolol (TOPROL-XL)  200 MG 24 hr tablet TAKE 1 TABLET DAILY  . MULTIPLE VITAMIN PO Take 1 tablet by mouth daily.   . NON FORMULARY   . omega-3 acid ethyl esters (LOVAZA) 1 g capsule TAKE 2 CAPSULES TWICE A DAY  . omeprazole (PRILOSEC) 20 MG capsule TAKE 1 CAPSULE DAILY  . predniSONE (DELTASONE) 10 MG tablet TAKE 6 TABLETS BY MOUTH DAILY FOR 2 DAYS, 5 TABLETS FOR 2 DAYS, 4 TABLETS FOR 2 DAYS, 3 TABLETS FOR 2 DAYS, 2 TABLETS FOR 2 DAYS, 1 TABLET FOR 2 DAYS  . Vitamin D, Ergocalciferol, (DRISDOL) 1.25 MG (50000 UNIT) CAPS capsule TAKE 1 CAPSULE ONCE WEEKLY  . warfarin (COUMADIN) 6 MG tablet Take 6 mg daily by mouth.   No facility-administered medications prior to visit.    Review of Systems  Skin: Positive for color change and rash. Negative for pallor and wound.       Objective    BP (!) 111/48 (BP Location: Left Arm, Patient Position: Sitting, Cuff Size: Large)   Pulse 70   Temp 98.5 F (36.9 C) (Oral)   Ht 5\' 8"  (1.727 m)   Wt 228 lb (103.4 kg)   SpO2 100%   BMI 34.67 kg/m     Physical Exam Constitutional:      Appearance: Normal appearance.  Skin:    General: Skin is warm and dry.     Findings: Erythema and rash present.     Comments: Diffuse full body rash. Rash is erythematous and macular. There is also diffuse scaling  and flaking of skin. There are a few discrete circular lesions, particularly on the legs but these are macular in nature and do not have raised edges or central clearing. There does not appear to be a superimposed infection.   Neurological:     Mental Status: He is alert.  Psychiatric:        Mood and Affect: Mood normal.        Behavior: Behavior normal.      Media Information         Document Information  Photos  Left arm  08/24/2020 10:24  Attached To:  Office Visit on 08/24/20 with Trey Sailors, PA-C   Source Information  Maryella Shivers  [facility]    Media Information         Document Information  Photos  Right  arm   08/24/2020 10:24  Attached To:  Office Visit on 08/24/20 with Trey Sailors, PA-C   Source Information  Osvaldo Angst M, PA-C  [facility]      No results found for any visits on 08/24/20.  Assessment & Plan    1. Psoriasis  Concern for erythrodermic psoriasis, especially considering abrupt withdrawal of chronic prednisone. Patient does not have a history of psoriasis but this may be his first presentation considering his prednisone use. May also consider fungal infection. Antifungal does interact with his warfarin so I have referred ugently to derm.   - Ambulatory referral to Dermatology  2. Rash    No follow-ups on file.      ITrey Sailors, PA-C, have reviewed all documentation for this visit. The documentation on 08/24/20 for the exam, diagnosis, procedures, and orders are all accurate and complete.  The entirety of the information documented in the History of Present Illness, Review of Systems and Physical Exam were personally obtained by me. Portions of this information were initially documented by [facility] and reviewed by me for thoroughness and accuracy.   I spent 20 minutes dedicated to the care of this patient on the date of this encounter to include pre-visit review of records, face-to-face time with the patient discussing psoriasis, and post visit ordering of testing.    Maryella Shivers  [facility] [phone] (phone) [phone] (fax)  [facility]

## 2020-08-28 ENCOUNTER — Encounter: Payer: Self-pay | Admitting: Physician Assistant

## 2020-08-30 DIAGNOSIS — L308 Other specified dermatitis: Secondary | ICD-10-CM | POA: Diagnosis not present

## 2020-08-30 DIAGNOSIS — D485 Neoplasm of uncertain behavior of skin: Secondary | ICD-10-CM | POA: Diagnosis not present

## 2020-08-30 DIAGNOSIS — L539 Erythematous condition, unspecified: Secondary | ICD-10-CM | POA: Diagnosis not present

## 2020-08-30 DIAGNOSIS — L309 Dermatitis, unspecified: Secondary | ICD-10-CM | POA: Diagnosis not present

## 2020-08-31 ENCOUNTER — Telehealth: Payer: Self-pay

## 2020-08-31 ENCOUNTER — Other Ambulatory Visit: Payer: Self-pay | Admitting: Family Medicine

## 2020-08-31 DIAGNOSIS — L539 Erythematous condition, unspecified: Secondary | ICD-10-CM | POA: Diagnosis not present

## 2020-08-31 DIAGNOSIS — M5137 Other intervertebral disc degeneration, lumbosacral region: Secondary | ICD-10-CM

## 2020-08-31 MED ORDER — ATORVASTATIN CALCIUM 80 MG PO TABS: 80.0000 mg | ORAL_TABLET | Freq: Every day | ORAL | 1 refills | Status: DC

## 2020-08-31 NOTE — Telephone Encounter (Signed)
[facility] faxed refill request for the following medications:   atorvastatin (LIPITOR) 80 MG tablet   Please advise. Thanks, [employer]

## 2020-09-01 MED ORDER — HYDROCODONE-ACETAMINOPHEN 5-325 MG PO TABS: 1.0000 | ORAL_TABLET | Freq: Four times a day (QID) | ORAL | 0 refills | Status: DC | PRN

## 2020-09-05 ENCOUNTER — Other Ambulatory Visit: Payer: Self-pay | Admitting: Family Medicine

## 2020-09-06 ENCOUNTER — Other Ambulatory Visit: Payer: Self-pay | Admitting: Family Medicine

## 2020-09-10 ENCOUNTER — Other Ambulatory Visit: Payer: Self-pay | Admitting: Family Medicine

## 2020-09-10 MED ORDER — PREDNISONE 10 MG PO TABS: ORAL_TABLET | ORAL | 0 refills | Status: DC

## 2020-09-10 NOTE — Telephone Encounter (Signed)
Requested Prescriptions  Pending Prescriptions Disp Refills  . omeprazole (PRILOSEC) 20 MG capsule [Pharmacy Med Name: OMEPRAZOLE DR CAPS 20MG ] 90 capsule 3    Sig: TAKE 1 CAPSULE DAILY     Gastroenterology: Proton Pump Inhibitors Passed - 09/10/2020  2:51 PM      Passed - Valid encounter within last 12 months    Recent Outpatient Visits          2 weeks ago Psoriasis   [facility], PA-C   1 year ago Prostate cancer screening   [facility], MD   1 year ago Need for shingles vaccine   [facility] M, M   2 years ago Hyperlipidemia, mixed   [facility], MD   2 years ago Hyperlipidemia, mixed   [facility], MD

## 2020-09-12 DIAGNOSIS — I2510 Atherosclerotic heart disease of native coronary artery without angina pectoris: Secondary | ICD-10-CM | POA: Diagnosis not present

## 2020-09-13 ENCOUNTER — Other Ambulatory Visit: Payer: Self-pay | Admitting: Family Medicine

## 2020-09-13 DIAGNOSIS — Z7689 Persons encountering health services in other specified circumstances: Secondary | ICD-10-CM | POA: Diagnosis not present

## 2020-09-13 DIAGNOSIS — L539 Erythematous condition, unspecified: Secondary | ICD-10-CM | POA: Diagnosis not present

## 2020-09-13 DIAGNOSIS — L905 Scar conditions and fibrosis of skin: Secondary | ICD-10-CM | POA: Diagnosis not present

## 2020-09-13 DIAGNOSIS — M5137 Other intervertebral disc degeneration, lumbosacral region: Secondary | ICD-10-CM

## 2020-09-13 MED ORDER — HYDROCODONE-ACETAMINOPHEN 5-325 MG PO TABS
1.0000 | ORAL_TABLET | Freq: Four times a day (QID) | ORAL | 0 refills | Status: DC | PRN
Start: 2020-09-13 — End: 2020-09-24

## 2020-09-24 ENCOUNTER — Other Ambulatory Visit: Payer: Self-pay | Admitting: Family Medicine

## 2020-09-24 DIAGNOSIS — M5137 Other intervertebral disc degeneration, lumbosacral region: Secondary | ICD-10-CM

## 2020-09-24 NOTE — Telephone Encounter (Signed)
Requested medication (s) are due for refill today: yes  Requested medication (s) are on the active medication list: yes  Last refill:  09/30/19  Future visit scheduled: no  Notes to clinic:  issue last addressed 05/04/2018-    Requested Prescriptions  Pending Prescriptions Disp Refills   omega-3 acid ethyl esters (LOVAZA) 1 g capsule [Pharmacy Med Name: OMEGA 3 ACID ETHYL ESTERS CAPS 1GM] 360 capsule 3    Sig: TAKE 2 CAPSULES TWICE A DAY      Endocrinology:  Nutritional Agents Passed - 09/24/2020  1:55 PM      Passed - Valid encounter within last 12 months    Recent Outpatient Visits           1 month ago Psoriasis   [facility] Trey Sailors, PA-C   1 year ago Prostate cancer screening   [facility] Malva Limes, MD   1 year ago Need for shingles vaccine   [facility] Osvaldo Angst M, [place]   2 years ago Hyperlipidemia, mixed   [facility] Malva Limes, MD   2 years ago Hyperlipidemia, mixed   [facility]va Limes, MD

## 2020-09-26 MED ORDER — HYDROCODONE-ACETAMINOPHEN 5-325 MG PO TABS: 1.0000 | ORAL_TABLET | Freq: Four times a day (QID) | ORAL | 0 refills | Status: DC | PRN

## 2020-10-04 DIAGNOSIS — L539 Erythematous condition, unspecified: Secondary | ICD-10-CM | POA: Diagnosis not present

## 2020-10-04 DIAGNOSIS — I2510 Atherosclerotic heart disease of native coronary artery without angina pectoris: Secondary | ICD-10-CM | POA: Diagnosis not present

## 2020-10-04 LAB — CBC AND DIFFERENTIAL
HCT: 30 — AB (ref 41–53)
Hemoglobin: 10.7 — AB (ref 13.5–17.5)
Neutrophils Absolute: 5.4
Platelets: 147 — AB (ref 150–399)
WBC: 7.9

## 2020-10-04 LAB — LIPID PANEL
Cholesterol: 252 — AB (ref 0–200)
HDL: 118 — AB (ref 35–70)
LDL Cholesterol: 98
Triglycerides: 75 (ref 40–160)

## 2020-10-04 LAB — BASIC METABOLIC PANEL
BUN: 16 (ref 4–21)
CO2: 20 (ref 13–22)
Chloride: 85 — AB (ref 99–108)
Creatinine: 1.1 (ref 0.6–1.3)
Glucose: 104
Potassium: 5.4 — AB (ref 3.4–5.3)
Sodium: 120 — AB (ref 137–147)

## 2020-10-04 LAB — CBC: RBC: 3.06 — AB (ref 3.87–5.11)

## 2020-10-04 LAB — HEPATIC FUNCTION PANEL
ALT: 15 (ref 10–40)
AST: 25 (ref 14–40)
Alkaline Phosphatase: 75 (ref 25–125)
Bilirubin, Total: 1

## 2020-10-04 LAB — COMPREHENSIVE METABOLIC PANEL
Albumin: 4.2 (ref 3.5–5.0)
Calcium: 9 (ref 8.7–10.7)
GFR calc Af Amer: 74
Globulin: 1.9

## 2020-10-05 ENCOUNTER — Other Ambulatory Visit: Payer: Self-pay | Admitting: Family Medicine

## 2020-10-05 LAB — MAGNESIUM: Magnesium: 1.1

## 2020-10-09 ENCOUNTER — Other Ambulatory Visit: Payer: Self-pay | Admitting: Family Medicine

## 2020-10-09 DIAGNOSIS — M5137 Other intervertebral disc degeneration, lumbosacral region: Secondary | ICD-10-CM

## 2020-10-10 MED ORDER — PREDNISONE 10 MG PO TABS: ORAL_TABLET | ORAL | 0 refills | Status: DC

## 2020-10-10 MED ORDER — HYDROCODONE-ACETAMINOPHEN 5-325 MG PO TABS: 1.0000 | ORAL_TABLET | Freq: Four times a day (QID) | ORAL | 0 refills | Status: DC | PRN

## 2020-10-18 ENCOUNTER — Encounter: Payer: Self-pay | Admitting: Family Medicine

## 2020-10-18 ENCOUNTER — Telehealth: Payer: Self-pay | Admitting: Family Medicine

## 2020-10-18 MED ORDER — PREDNISONE 10 MG PO TABS: 10.0000 mg | ORAL_TABLET | Freq: Every day | ORAL | 0 refills | Status: DC

## 2020-10-18 NOTE — Telephone Encounter (Signed)
Please advise patient we received labs from [facility] dermatology and he has very magnesium and sodium levels, which is a big change since we did labs in January. He needs to take OTC magnesium oxide 500 mg once a day. It may be related to him taking prednisone tapers so frequently. I want him to stay on a low dose of 10mg  prednisone when he finishes current taper. Have sent in prescription but he is to only take 1 tablet daily.  He needs to schedule follow up in 2-3 weeks to check electrolytes and magnesium levels.

## 2020-10-18 NOTE — Telephone Encounter (Signed)
Tried calling patient. Left message to call back. OK for [facility] triage to advise of message from 10/18/2020.

## 2020-10-18 NOTE — Telephone Encounter (Signed)
Patient returned call . Reviewed message from Dr. Sherrie Mustache on 10/18/20. Patient verbalized understanding of result note. Patient reports he has been taking magnesium oxide 500 mg and cyclosporin per dermatology for a couple of weeks. Verbalized understanding to finish current taper of prednisone and only take 1 tablet daily. appt scheduled for follow up on 11/03/20.

## 2020-10-20 DIAGNOSIS — Z952 Presence of prosthetic heart valve: Secondary | ICD-10-CM | POA: Diagnosis not present

## 2020-10-23 ENCOUNTER — Other Ambulatory Visit: Payer: Self-pay | Admitting: Family Medicine

## 2020-10-23 DIAGNOSIS — I2510 Atherosclerotic heart disease of native coronary artery without angina pectoris: Secondary | ICD-10-CM | POA: Diagnosis not present

## 2020-10-23 DIAGNOSIS — M5137 Other intervertebral disc degeneration, lumbosacral region: Secondary | ICD-10-CM

## 2020-10-23 MED ORDER — HYDROCODONE-ACETAMINOPHEN 5-325 MG PO TABS: 1.0000 | ORAL_TABLET | Freq: Four times a day (QID) | ORAL | 0 refills | Status: DC | PRN

## 2020-10-26 DIAGNOSIS — I2510 Atherosclerotic heart disease of native coronary artery without angina pectoris: Secondary | ICD-10-CM | POA: Diagnosis not present

## 2020-10-26 DIAGNOSIS — Z952 Presence of prosthetic heart valve: Secondary | ICD-10-CM | POA: Diagnosis not present

## 2020-10-26 DIAGNOSIS — E785 Hyperlipidemia, unspecified: Secondary | ICD-10-CM | POA: Diagnosis not present

## 2020-10-26 DIAGNOSIS — I10 Essential (primary) hypertension: Secondary | ICD-10-CM | POA: Diagnosis not present

## 2020-11-01 DIAGNOSIS — L539 Erythematous condition, unspecified: Secondary | ICD-10-CM | POA: Diagnosis not present

## 2020-11-03 ENCOUNTER — Other Ambulatory Visit: Payer: Self-pay

## 2020-11-03 ENCOUNTER — Ambulatory Visit: Payer: BC Managed Care – PPO | Admitting: Family Medicine

## 2020-11-03 ENCOUNTER — Other Ambulatory Visit: Payer: Self-pay | Admitting: Family Medicine

## 2020-11-03 VITALS — BP 139/66 | HR 71 | Ht 71.0 in | Wt 216.2 lb

## 2020-11-03 DIAGNOSIS — Z23 Encounter for immunization: Secondary | ICD-10-CM

## 2020-11-03 DIAGNOSIS — M5137 Other intervertebral disc degeneration, lumbosacral region: Secondary | ICD-10-CM

## 2020-11-03 DIAGNOSIS — E871 Hypo-osmolality and hyponatremia: Secondary | ICD-10-CM

## 2020-11-03 DIAGNOSIS — H6691 Otitis media, unspecified, right ear: Secondary | ICD-10-CM | POA: Diagnosis not present

## 2020-11-03 DIAGNOSIS — E669 Obesity, unspecified: Secondary | ICD-10-CM | POA: Diagnosis not present

## 2020-11-03 DIAGNOSIS — I2510 Atherosclerotic heart disease of native coronary artery without angina pectoris: Secondary | ICD-10-CM | POA: Diagnosis not present

## 2020-11-03 MED ORDER — CEPHALEXIN 500 MG PO CAPS: 500.0000 mg | ORAL_CAPSULE | Freq: Two times a day (BID) | ORAL | 0 refills | Status: AC

## 2020-11-03 MED ORDER — HYDROCODONE-ACETAMINOPHEN 5-325 MG PO TABS: 1.0000 | ORAL_TABLET | Freq: Four times a day (QID) | ORAL | 0 refills | Status: DC | PRN

## 2020-11-03 NOTE — Progress Notes (Signed)
Established patient visit   Patient: Isaac Hancock   DOB: 13-Apr-1956   65 y.o. Male  MRN: [id number] Visit Date: 11/03/2020  Today's healthcare provider: Mila Merry, MD   No chief complaint on file.  Subjective    HPI  Patient presents to recheck magnesium levels.  Patient labs were last resulted 10/04/20 that were ordered by dermatologist.  Patient was told to take OTC magnesium oxide 500 mg once a day. Patient has complied with these instructions.   Abstract on 10/18/2020  Component Date Value Ref Range Status  . Hemoglobin 10/04/2020 10.7* 13.5 - 17.5 Final  . HCT 10/04/2020 30* 41 - 53 Final  . Neutrophils Absolute 10/04/2020 5.40   Final  . Platelets 10/04/2020 147* 150 - 399 Final  . WBC 10/04/2020 7.9   Final  . RBC 10/04/2020 3.06* 3.87 - 5.11 Final  . Glucose 10/04/2020 104   Final  . BUN 10/04/2020 16  4 - 21 Final  . CO2 10/04/2020 20  13 - 22 Final  . Creatinine 10/04/2020 1.1  0.6 - 1.3 Final  . Potassium 10/04/2020 5.4* 3.4 - 5.3 Final  . Sodium 10/04/2020 120* 137 - 147 Final  . Chloride 10/04/2020 85* 99 - 108 Final  . Globulin 10/04/2020 1.9   Final  . GFR calc Af Amer 10/04/2020 74   Final  . Calcium 10/04/2020 9.0  8.7 - 10.7 Final  . Albumin 10/04/2020 4.2  3.5 - 5.0 Final  . Magnesium 10/04/2020 1.1   Final  . Triglycerides 10/04/2020 75  40 - 160 Final  . Cholesterol 10/04/2020 252* 0 - 200 Final  . HDL 10/04/2020 118* 35 - 70 Final  . LDL Cholesterol 10/04/2020 98   Final  . Alkaline Phosphatase 10/04/2020 75  25 - 125 Final  . ALT 10/04/2020 15  10 - 40 Final  . AST 10/04/2020 25  14 - 40 Final  . Bilirubin, Total 10/04/2020 1.0   Final   He is noted to take frequent prednisone tapers for his back, which was thought to possibly be related to lab findings. He was put on a 12 days prednisone taper on 10-18-20, but kept on 10mg /day a the tend of the taper which he is still on to make sure he isn't having adrenal insufficiency secondary to  corticosteroid withdrawal.      Medications: Outpatient Medications Prior to Visit  Medication Sig  . ALPRAZolam (XANAX) 0.5 MG tablet Take 1 tablet (0.5 mg total) by mouth every 6 (six) hours as needed.  amLODipine (NORVASC) 10 MG tablet   . atorvastatin (LIPITOR) 80 MG tablet Take 1 tablet (80 mg total) by mouth daily.  . enalapril (VASOTEC) 20 MG tablet Take 20 mg by mouth 2 (two) times daily.   . fexofenadine (ALLEGRA) 60 MG tablet Take 60 mg by mouth 2 (two) times daily.   . furosemide (LASIX) 20 MG tablet Take 20 mg by mouth daily.   Marland Kitchen HYDROcodone-acetaminophen (NORCO/VICODIN) 5-325 MG tablet Take 1-2 tablets by mouth every 6 (six) hours as needed for moderate pain.  . metoprolol (TOPROL-XL) 200 MG 24 hr tablet TAKE 1 TABLET DAILY  . MULTIPLE VITAMIN PO Take 1 tablet by mouth daily.   . NON FORMULARY   . omega-3 acid ethyl esters (LOVAZA) 1 g capsule Take 2 capsules (2 g total) by mouth 2 (two) times daily.  Marland Kitchen omeprazole (PRILOSEC) 20 MG capsule TAKE 1 CAPSULE DAILY  . predniSONE (DELTASONE) 10 MG  tablet Take 1 tablet (10 mg total) by mouth daily.  . Vitamin D, Ergocalciferol, (DRISDOL) 1.25 MG (50000 UNIT) CAPS capsule TAKE 1 CAPSULE ONCE WEEKLY  . warfarin (COUMADIN) 6 MG tablet Take 6 mg daily by mouth.   No facility-administered medications prior to visit.        Objective    BP 139/66 (BP Location: Right Arm, Patient Position: Sitting, Cuff Size: Large)   Pulse 71   Ht 5\' 11"  (1.803 m)   Wt 216 lb 3.2 oz (98.1 kg)   SpO2 100%   BMI 30.15 kg/m     Physical Exam  General appearance: Mildly obese male, cooperative and in no acute distress Head: Normocephalic, without obvious abnormality, atraumatic Respiratory: Respirations even and unlabored, normal respiratory rate ENT: Patch of erythema on left TM. He reports he has felt congested in this ear.  Psych: Appropriate mood and affect. Neurologic: Mental status: Alert, oriented to person, place, and time, thought  content appropriate.   No results found for any visits on 11/03/20.  Assessment & Plan     1. Need for tetanus, diphtheria, and acellular pertussis (Tdap) vaccine in patient of adolescent age or older  - Administer Tetanus-diphtheria-acellular pertussis (Tdap) vaccine  2. Right otitis media, unspecified otitis media type  - cephALEXin (KEFLEX) 500 MG capsule; Take 1 capsule (500 mg total) by mouth 2 (two) times daily for 5 days.  Dispense: 10 capsule; Refill: 0  3. Hyponatremia, hyperkalemia, hypomagnasemia.  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 Is now on 10mg  prednisone since finishing burst and taper last week. Continues on magnesium supplements.  - Comprehensive metabolic panel - TSH - Magnesium - Cortisol       The entirety of the information documented in the History of Present Illness, Review of Systems and Physical Exam were personally obtained by me. Portions of this information were initially documented by the CMA and reviewed by me for thoroughness and accuracy.      11/05/20, MD  [facility] [phone] (phone) [phone] (fax)  [facility]

## 2020-11-04 LAB — COMPREHENSIVE METABOLIC PANEL
ALT: 35 IU/L (ref 0–44)
AST: 40 IU/L (ref 0–40)
Albumin/Globulin Ratio: 2.5 — ABNORMAL HIGH (ref 1.2–2.2)
Albumin: 4.5 g/dL (ref 3.8–4.8)
Alkaline Phosphatase: 61 IU/L (ref 44–121)
BUN/Creatinine Ratio: 8 — ABNORMAL LOW (ref 10–24)
BUN: 9 mg/dL (ref 8–27)
Bilirubin Total: 1 mg/dL (ref 0.0–1.2)
CO2: 22 mmol/L (ref 20–29)
Calcium: 9.6 mg/dL (ref 8.6–10.2)
Chloride: 88 mmol/L — ABNORMAL LOW (ref 96–106)
Creatinine, Ser: 1.14 mg/dL (ref 0.76–1.27)
Globulin, Total: 1.8 g/dL (ref 1.5–4.5)
Glucose: 113 mg/dL — ABNORMAL HIGH (ref 65–99)
Potassium: 4.8 mmol/L (ref 3.5–5.2)
Sodium: 132 mmol/L — ABNORMAL LOW (ref 134–144)
Total Protein: 6.3 g/dL (ref 6.0–8.5)
eGFR: 73 mL/min/{1.73_m2} (ref >59)

## 2020-11-04 LAB — TSH: TSH: 1.52 u[IU]/mL (ref 0.450–4.500)

## 2020-11-04 LAB — MAGNESIUM: Magnesium: 1.1 mg/dL — ABNORMAL LOW (ref 1.6–2.3)

## 2020-11-04 LAB — CORTISOL: Cortisol: 1.4 ug/dL

## 2020-11-08 ENCOUNTER — Other Ambulatory Visit: Payer: Self-pay | Admitting: Family Medicine

## 2020-11-08 DIAGNOSIS — F19939 Other psychoactive substance use, unspecified with withdrawal, unspecified: Secondary | ICD-10-CM

## 2020-11-08 DIAGNOSIS — F19239 Other psychoactive substance dependence with withdrawal, unspecified: Secondary | ICD-10-CM

## 2020-11-08 MED ORDER — PREDNISONE 5 MG PO TABS: 5.0000 mg | ORAL_TABLET | Freq: Every day | ORAL | 0 refills | Status: DC

## 2020-11-13 ENCOUNTER — Other Ambulatory Visit: Payer: Self-pay | Admitting: Family Medicine

## 2020-11-13 DIAGNOSIS — H6691 Otitis media, unspecified, right ear: Secondary | ICD-10-CM

## 2020-11-17 ENCOUNTER — Other Ambulatory Visit: Payer: Self-pay | Admitting: Family Medicine

## 2020-11-17 DIAGNOSIS — M5137 Other intervertebral disc degeneration, lumbosacral region: Secondary | ICD-10-CM

## 2020-11-17 MED ORDER — HYDROCODONE-ACETAMINOPHEN 5-325 MG PO TABS: 1.0000 | ORAL_TABLET | Freq: Four times a day (QID) | ORAL | 0 refills | Status: DC | PRN

## 2020-11-29 ENCOUNTER — Encounter: Payer: Self-pay | Admitting: Family Medicine

## 2020-11-29 ENCOUNTER — Telehealth: Payer: Self-pay | Admitting: Family Medicine

## 2020-11-29 DIAGNOSIS — E871 Hypo-osmolality and hyponatremia: Secondary | ICD-10-CM

## 2020-11-29 NOTE — Telephone Encounter (Signed)
Orders only

## 2020-11-30 DIAGNOSIS — I10 Essential (primary) hypertension: Secondary | ICD-10-CM | POA: Diagnosis not present

## 2020-11-30 DIAGNOSIS — I2510 Atherosclerotic heart disease of native coronary artery without angina pectoris: Secondary | ICD-10-CM | POA: Diagnosis not present

## 2020-11-30 DIAGNOSIS — E871 Hypo-osmolality and hyponatremia: Secondary | ICD-10-CM | POA: Diagnosis not present

## 2020-11-30 DIAGNOSIS — I350 Nonrheumatic aortic (valve) stenosis: Secondary | ICD-10-CM | POA: Diagnosis not present

## 2020-11-30 DIAGNOSIS — R0602 Shortness of breath: Secondary | ICD-10-CM | POA: Diagnosis not present

## 2020-12-01 ENCOUNTER — Other Ambulatory Visit: Payer: Self-pay | Admitting: Family Medicine

## 2020-12-01 DIAGNOSIS — M5137 Other intervertebral disc degeneration, lumbosacral region: Secondary | ICD-10-CM

## 2020-12-01 DIAGNOSIS — L539 Erythematous condition, unspecified: Secondary | ICD-10-CM | POA: Diagnosis not present

## 2020-12-01 LAB — RENAL FUNCTION PANEL
Albumin: 4.5 g/dL (ref 3.8–4.8)
BUN/Creatinine Ratio: 9 — ABNORMAL LOW (ref 10–24)
BUN: 8 mg/dL (ref 8–27)
CO2: 22 mmol/L (ref 20–29)
Calcium: 8.9 mg/dL (ref 8.6–10.2)
Chloride: 88 mmol/L — ABNORMAL LOW (ref 96–106)
Creatinine, Ser: 0.92 mg/dL (ref 0.76–1.27)
Glucose: 95 mg/dL (ref 65–99)
Phosphorus: 3.1 mg/dL (ref 2.8–4.1)
Potassium: 4.6 mmol/L (ref 3.5–5.2)
Sodium: 126 mmol/L — ABNORMAL LOW (ref 134–144)
eGFR: 93 mL/min/{1.73_m2} (ref >59)

## 2020-12-01 LAB — CORTISOL: Cortisol: 2 ug/dL

## 2020-12-01 LAB — MAGNESIUM: Magnesium: 1.1 mg/dL — ABNORMAL LOW (ref 1.6–2.3)

## 2020-12-01 MED ORDER — HYDROCODONE-ACETAMINOPHEN 5-325 MG PO TABS: 1.0000 | ORAL_TABLET | Freq: Four times a day (QID) | ORAL | 0 refills | Status: DC | PRN

## 2020-12-02 ENCOUNTER — Other Ambulatory Visit: Payer: Self-pay | Admitting: Family Medicine

## 2020-12-02 DIAGNOSIS — F19939 Other psychoactive substance use, unspecified with withdrawal, unspecified: Secondary | ICD-10-CM

## 2020-12-02 DIAGNOSIS — F19239 Other psychoactive substance dependence with withdrawal, unspecified: Secondary | ICD-10-CM

## 2020-12-02 MED ORDER — PREDNISONE 5 MG PO TABS: 5.0000 mg | ORAL_TABLET | Freq: Every day | ORAL | 0 refills | Status: DC

## 2020-12-15 ENCOUNTER — Other Ambulatory Visit: Payer: Self-pay | Admitting: Family Medicine

## 2020-12-15 DIAGNOSIS — M5137 Other intervertebral disc degeneration, lumbosacral region: Secondary | ICD-10-CM

## 2020-12-15 MED ORDER — HYDROCODONE-ACETAMINOPHEN 5-325 MG PO TABS: 1.0000 | ORAL_TABLET | Freq: Four times a day (QID) | ORAL | 0 refills | Status: DC | PRN

## 2020-12-24 ENCOUNTER — Other Ambulatory Visit: Payer: Self-pay | Admitting: Family Medicine

## 2020-12-24 NOTE — Telephone Encounter (Signed)
Requested Prescriptions  Pending Prescriptions Disp Refills  . metoprolol (TOPROL-XL) 200 MG 24 hr tablet [Pharmacy Med Name: METOPROLOL SUCCINATE ER TABS 200MG ] 90 tablet 3    Sig: TAKE 1 TABLET DAILY     Cardiovascular:  Beta Blockers Passed - 12/24/2020  5:17 PM      Passed - Last BP in normal range    BP Readings from Last 1 Encounters:  11/03/20 139/66         Passed - Last Heart Rate in normal range    Pulse Readings from Last 1 Encounters:  11/03/20 71         Passed - Valid encounter within last 6 months    Recent Outpatient Visits          1 month ago Need for tetanus, diphtheria, and acellular pertussis (Tdap) vaccine in patient of adolescent age or older   [facility] Fisher, 11/05/20, MD   4 months ago Psoriasis   [facility] [place], [place], Lavella Hammock   1 year ago Prostate cancer screening   [facility], MD   1 year ago Need for shingles vaccine   [facility] M, M   2 years ago Hyperlipidemia, mixed   [facility], MD

## 2020-12-27 ENCOUNTER — Telehealth: Payer: Self-pay | Admitting: Family Medicine

## 2020-12-27 ENCOUNTER — Other Ambulatory Visit: Payer: Self-pay | Admitting: Family Medicine

## 2020-12-27 DIAGNOSIS — I10 Essential (primary) hypertension: Secondary | ICD-10-CM

## 2020-12-27 DIAGNOSIS — M5137 Other intervertebral disc degeneration, lumbosacral region: Secondary | ICD-10-CM

## 2020-12-27 DIAGNOSIS — E871 Hypo-osmolality and hyponatremia: Secondary | ICD-10-CM

## 2020-12-27 DIAGNOSIS — R7303 Prediabetes: Secondary | ICD-10-CM

## 2020-12-27 DIAGNOSIS — F19939 Other psychoactive substance use, unspecified with withdrawal, unspecified: Secondary | ICD-10-CM

## 2020-12-27 DIAGNOSIS — E559 Vitamin D deficiency, unspecified: Secondary | ICD-10-CM

## 2020-12-28 ENCOUNTER — Other Ambulatory Visit: Payer: Self-pay | Admitting: Family Medicine

## 2020-12-28 DIAGNOSIS — M5137 Other intervertebral disc degeneration, lumbosacral region: Secondary | ICD-10-CM

## 2020-12-28 DIAGNOSIS — F19939 Other psychoactive substance use, unspecified with withdrawal, unspecified: Secondary | ICD-10-CM

## 2020-12-28 MED ORDER — HYDROCODONE-ACETAMINOPHEN 5-325 MG PO TABS: 1.0000 | ORAL_TABLET | Freq: Four times a day (QID) | ORAL | 0 refills | Status: DC | PRN

## 2020-12-28 NOTE — Telephone Encounter (Signed)
Left message advising pt he is due for lab work.   Thanks,   -Vernona Rieger

## 2020-12-30 MED ORDER — PREDNISONE 2.5 MG PO TABS: 2.5000 mg | ORAL_TABLET | Freq: Every day | ORAL | 0 refills | Status: DC

## 2021-01-11 ENCOUNTER — Other Ambulatory Visit: Payer: Self-pay | Admitting: Family Medicine

## 2021-01-11 DIAGNOSIS — M5137 Other intervertebral disc degeneration, lumbosacral region: Secondary | ICD-10-CM

## 2021-01-11 MED ORDER — HYDROCODONE-ACETAMINOPHEN 5-325 MG PO TABS: 1.0000 | ORAL_TABLET | Freq: Four times a day (QID) | ORAL | 0 refills | Status: DC | PRN

## 2021-01-18 DIAGNOSIS — I2510 Atherosclerotic heart disease of native coronary artery without angina pectoris: Secondary | ICD-10-CM | POA: Diagnosis not present

## 2021-01-18 DIAGNOSIS — E871 Hypo-osmolality and hyponatremia: Secondary | ICD-10-CM | POA: Diagnosis not present

## 2021-01-18 DIAGNOSIS — R7303 Prediabetes: Secondary | ICD-10-CM | POA: Diagnosis not present

## 2021-01-18 DIAGNOSIS — E559 Vitamin D deficiency, unspecified: Secondary | ICD-10-CM | POA: Diagnosis not present

## 2021-01-19 LAB — VITAMIN D 25 HYDROXY (VIT D DEFICIENCY, FRACTURES): Vit D, 25-Hydroxy: 55.6 ng/mL (ref 30.0–100.0)

## 2021-01-19 LAB — RENAL FUNCTION PANEL
Albumin: 4.9 g/dL — ABNORMAL HIGH (ref 3.8–4.8)
BUN/Creatinine Ratio: 12 (ref 10–24)
BUN: 14 mg/dL (ref 8–27)
CO2: 22 mmol/L (ref 20–29)
Calcium: 9.1 mg/dL (ref 8.6–10.2)
Chloride: 89 mmol/L — ABNORMAL LOW (ref 96–106)
Creatinine, Ser: 1.2 mg/dL (ref 0.76–1.27)
Glucose: 108 mg/dL — ABNORMAL HIGH (ref 65–99)
Phosphorus: 3 mg/dL (ref 2.8–4.1)
Potassium: 4.1 mmol/L (ref 3.5–5.2)
Sodium: 130 mmol/L — ABNORMAL LOW (ref 134–144)
eGFR: 68 mL/min/{1.73_m2} (ref >59)

## 2021-01-19 LAB — CORTISOL: Cortisol: 4.8 ug/dL

## 2021-01-19 LAB — HEMOGLOBIN A1C
Est. average glucose Bld gHb Est-mCnc: 103 mg/dL
Hgb A1c MFr Bld: 5.2 % (ref 4.8–5.6)

## 2021-01-19 LAB — MAGNESIUM: Magnesium: 1.3 mg/dL — ABNORMAL LOW (ref 1.6–2.3)

## 2021-01-22 DIAGNOSIS — I10 Essential (primary) hypertension: Secondary | ICD-10-CM | POA: Diagnosis not present

## 2021-01-22 DIAGNOSIS — I350 Nonrheumatic aortic (valve) stenosis: Secondary | ICD-10-CM | POA: Diagnosis not present

## 2021-01-22 DIAGNOSIS — I2510 Atherosclerotic heart disease of native coronary artery without angina pectoris: Secondary | ICD-10-CM | POA: Diagnosis not present

## 2021-01-22 DIAGNOSIS — E785 Hyperlipidemia, unspecified: Secondary | ICD-10-CM | POA: Diagnosis not present

## 2021-01-23 ENCOUNTER — Other Ambulatory Visit: Payer: Self-pay | Admitting: Family Medicine

## 2021-01-23 DIAGNOSIS — M5137 Other intervertebral disc degeneration, lumbosacral region: Secondary | ICD-10-CM

## 2021-01-24 MED ORDER — HYDROCODONE-ACETAMINOPHEN 5-325 MG PO TABS: 1.0000 | ORAL_TABLET | Freq: Four times a day (QID) | ORAL | 0 refills | Status: DC | PRN

## 2021-01-24 MED ORDER — PREDNISONE 1 MG PO TABS: 1.0000 mg | ORAL_TABLET | Freq: Every day | ORAL | 0 refills | Status: DC

## 2021-01-25 DIAGNOSIS — I2510 Atherosclerotic heart disease of native coronary artery without angina pectoris: Secondary | ICD-10-CM | POA: Diagnosis not present

## 2021-01-26 DIAGNOSIS — E785 Hyperlipidemia, unspecified: Secondary | ICD-10-CM | POA: Diagnosis not present

## 2021-01-26 DIAGNOSIS — I350 Nonrheumatic aortic (valve) stenosis: Secondary | ICD-10-CM | POA: Diagnosis not present

## 2021-01-26 DIAGNOSIS — I10 Essential (primary) hypertension: Secondary | ICD-10-CM | POA: Diagnosis not present

## 2021-01-26 DIAGNOSIS — I2510 Atherosclerotic heart disease of native coronary artery without angina pectoris: Secondary | ICD-10-CM | POA: Diagnosis not present

## 2021-02-02 DIAGNOSIS — I2510 Atherosclerotic heart disease of native coronary artery without angina pectoris: Secondary | ICD-10-CM | POA: Diagnosis not present

## 2021-02-06 ENCOUNTER — Other Ambulatory Visit: Payer: Self-pay | Admitting: Family Medicine

## 2021-02-06 DIAGNOSIS — M5137 Other intervertebral disc degeneration, lumbosacral region: Secondary | ICD-10-CM

## 2021-02-07 MED ORDER — HYDROCODONE-ACETAMINOPHEN 5-325 MG PO TABS: 1.0000 | ORAL_TABLET | Freq: Four times a day (QID) | ORAL | 0 refills | Status: DC | PRN

## 2021-02-14 ENCOUNTER — Encounter: Payer: Self-pay | Admitting: Family Medicine

## 2021-02-14 DIAGNOSIS — M109 Gout, unspecified: Secondary | ICD-10-CM

## 2021-02-15 MED ORDER — COLCHICINE 0.6 MG PO TABS: ORAL_TABLET | ORAL | 1 refills | Status: DC

## 2021-02-20 ENCOUNTER — Other Ambulatory Visit: Payer: Self-pay | Admitting: Family Medicine

## 2021-02-20 DIAGNOSIS — M5137 Other intervertebral disc degeneration, lumbosacral region: Secondary | ICD-10-CM

## 2021-02-21 ENCOUNTER — Other Ambulatory Visit: Payer: Self-pay | Admitting: Family Medicine

## 2021-02-21 DIAGNOSIS — M5137 Other intervertebral disc degeneration, lumbosacral region: Secondary | ICD-10-CM

## 2021-02-21 MED ORDER — HYDROCODONE-ACETAMINOPHEN 5-325 MG PO TABS: 1.0000 | ORAL_TABLET | Freq: Four times a day (QID) | ORAL | 0 refills | Status: DC | PRN

## 2021-02-25 ENCOUNTER — Other Ambulatory Visit: Payer: Self-pay | Admitting: Family Medicine

## 2021-03-04 ENCOUNTER — Encounter: Payer: Self-pay | Admitting: Family Medicine

## 2021-03-05 ENCOUNTER — Other Ambulatory Visit: Payer: Self-pay

## 2021-03-05 DIAGNOSIS — F419 Anxiety disorder, unspecified: Secondary | ICD-10-CM

## 2021-03-05 NOTE — Telephone Encounter (Signed)
LOV 08/15/19, only acute since then NOV none LRF 07/12/20 60 x 5 Last PHQ 08/24/20, no GAD that I can find

## 2021-03-06 ENCOUNTER — Other Ambulatory Visit: Payer: Self-pay | Admitting: Family Medicine

## 2021-03-06 DIAGNOSIS — F419 Anxiety disorder, unspecified: Secondary | ICD-10-CM

## 2021-03-09 ENCOUNTER — Other Ambulatory Visit: Payer: Self-pay | Admitting: Family Medicine

## 2021-03-09 DIAGNOSIS — M5137 Other intervertebral disc degeneration, lumbosacral region: Secondary | ICD-10-CM

## 2021-03-11 MED ORDER — HYDROCODONE-ACETAMINOPHEN 5-325 MG PO TABS: 1.0000 | ORAL_TABLET | Freq: Four times a day (QID) | ORAL | 0 refills | Status: DC | PRN

## 2021-03-23 ENCOUNTER — Other Ambulatory Visit: Payer: Self-pay | Admitting: Family Medicine

## 2021-03-23 DIAGNOSIS — M5137 Other intervertebral disc degeneration, lumbosacral region: Secondary | ICD-10-CM

## 2021-03-24 ENCOUNTER — Other Ambulatory Visit: Payer: Self-pay | Admitting: Family Medicine

## 2021-03-24 DIAGNOSIS — M5137 Other intervertebral disc degeneration, lumbosacral region: Secondary | ICD-10-CM

## 2021-03-25 MED ORDER — HYDROCODONE-ACETAMINOPHEN 5-325 MG PO TABS: 1.0000 | ORAL_TABLET | Freq: Four times a day (QID) | ORAL | 0 refills | Status: DC | PRN

## 2021-03-30 DIAGNOSIS — I2510 Atherosclerotic heart disease of native coronary artery without angina pectoris: Secondary | ICD-10-CM | POA: Diagnosis not present

## 2021-04-02 DIAGNOSIS — E785 Hyperlipidemia, unspecified: Secondary | ICD-10-CM | POA: Diagnosis not present

## 2021-04-02 DIAGNOSIS — I060 Rheumatic aortic stenosis: Secondary | ICD-10-CM | POA: Diagnosis not present

## 2021-04-02 DIAGNOSIS — I2510 Atherosclerotic heart disease of native coronary artery without angina pectoris: Secondary | ICD-10-CM | POA: Diagnosis not present

## 2021-04-02 DIAGNOSIS — I10 Essential (primary) hypertension: Secondary | ICD-10-CM | POA: Diagnosis not present

## 2021-04-06 ENCOUNTER — Other Ambulatory Visit: Payer: Self-pay | Admitting: Family Medicine

## 2021-04-06 DIAGNOSIS — M5137 Other intervertebral disc degeneration, lumbosacral region: Secondary | ICD-10-CM

## 2021-04-07 MED ORDER — HYDROCODONE-ACETAMINOPHEN 5-325 MG PO TABS: 1.0000 | ORAL_TABLET | Freq: Four times a day (QID) | ORAL | 0 refills | Status: DC | PRN

## 2021-04-11 DIAGNOSIS — I2510 Atherosclerotic heart disease of native coronary artery without angina pectoris: Secondary | ICD-10-CM | POA: Diagnosis not present

## 2021-04-20 ENCOUNTER — Other Ambulatory Visit: Payer: Self-pay | Admitting: Family Medicine

## 2021-04-20 DIAGNOSIS — M5137 Other intervertebral disc degeneration, lumbosacral region: Secondary | ICD-10-CM

## 2021-04-20 NOTE — Telephone Encounter (Signed)
LOV: 11/03/2020 NOV: None   Last Refill:  04/07/2021 #60 0 Refills   Thanks,   -Vernona Rieger

## 2021-04-21 ENCOUNTER — Other Ambulatory Visit: Payer: Self-pay | Admitting: Family Medicine

## 2021-04-21 DIAGNOSIS — M5137 Other intervertebral disc degeneration, lumbosacral region: Secondary | ICD-10-CM

## 2021-04-22 MED ORDER — HYDROCODONE-ACETAMINOPHEN 5-325 MG PO TABS: 1.0000 | ORAL_TABLET | Freq: Four times a day (QID) | ORAL | 0 refills | Status: DC | PRN

## 2021-05-04 ENCOUNTER — Other Ambulatory Visit: Payer: Self-pay | Admitting: Family Medicine

## 2021-05-04 DIAGNOSIS — M5137 Other intervertebral disc degeneration, lumbosacral region: Secondary | ICD-10-CM

## 2021-05-04 MED ORDER — HYDROCODONE-ACETAMINOPHEN 5-325 MG PO TABS: 1.0000 | ORAL_TABLET | Freq: Four times a day (QID) | ORAL | 0 refills | Status: DC | PRN

## 2021-05-18 ENCOUNTER — Other Ambulatory Visit: Payer: Self-pay | Admitting: Family Medicine

## 2021-05-18 DIAGNOSIS — M5137 Other intervertebral disc degeneration, lumbosacral region: Secondary | ICD-10-CM

## 2021-05-18 MED ORDER — HYDROCODONE-ACETAMINOPHEN 5-325 MG PO TABS: 1.0000 | ORAL_TABLET | Freq: Four times a day (QID) | ORAL | 0 refills | Status: DC | PRN

## 2021-05-20 MED ORDER — HYDROCODONE-ACETAMINOPHEN 5-325 MG PO TABS: 1.0000 | ORAL_TABLET | Freq: Four times a day (QID) | ORAL | 0 refills | Status: DC | PRN

## 2021-05-31 ENCOUNTER — Other Ambulatory Visit: Payer: Self-pay | Admitting: Family Medicine

## 2021-05-31 DIAGNOSIS — M5137 Other intervertebral disc degeneration, lumbosacral region: Secondary | ICD-10-CM

## 2021-06-02 ENCOUNTER — Other Ambulatory Visit: Payer: Self-pay | Admitting: Family Medicine

## 2021-06-02 DIAGNOSIS — M5137 Other intervertebral disc degeneration, lumbosacral region: Secondary | ICD-10-CM

## 2021-06-03 ENCOUNTER — Other Ambulatory Visit: Payer: Self-pay | Admitting: Family Medicine

## 2021-06-03 DIAGNOSIS — M5137 Other intervertebral disc degeneration, lumbosacral region: Secondary | ICD-10-CM

## 2021-06-03 MED ORDER — HYDROCODONE-ACETAMINOPHEN 5-325 MG PO TABS: 1.0000 | ORAL_TABLET | Freq: Four times a day (QID) | ORAL | 0 refills | Status: DC | PRN

## 2021-06-15 ENCOUNTER — Other Ambulatory Visit: Payer: Self-pay | Admitting: Family Medicine

## 2021-06-15 DIAGNOSIS — M5137 Other intervertebral disc degeneration, lumbosacral region: Secondary | ICD-10-CM

## 2021-06-17 ENCOUNTER — Other Ambulatory Visit: Payer: Self-pay | Admitting: Family Medicine

## 2021-06-18 MED ORDER — HYDROCODONE-ACETAMINOPHEN 5-325 MG PO TABS: 1.0000 | ORAL_TABLET | Freq: Four times a day (QID) | ORAL | 0 refills | Status: DC | PRN

## 2021-06-29 ENCOUNTER — Other Ambulatory Visit: Payer: Self-pay | Admitting: Family Medicine

## 2021-06-29 DIAGNOSIS — M5137 Other intervertebral disc degeneration, lumbosacral region: Secondary | ICD-10-CM

## 2021-06-29 MED ORDER — HYDROCODONE-ACETAMINOPHEN 5-325 MG PO TABS: 1.0000 | ORAL_TABLET | Freq: Four times a day (QID) | ORAL | 0 refills | Status: DC | PRN

## 2021-07-13 ENCOUNTER — Other Ambulatory Visit: Payer: Self-pay | Admitting: Family Medicine

## 2021-07-13 DIAGNOSIS — M5137 Other intervertebral disc degeneration, lumbosacral region: Secondary | ICD-10-CM

## 2021-07-13 DIAGNOSIS — I2510 Atherosclerotic heart disease of native coronary artery without angina pectoris: Secondary | ICD-10-CM | POA: Diagnosis not present

## 2021-07-13 MED ORDER — HYDROCODONE-ACETAMINOPHEN 5-325 MG PO TABS: 1.0000 | ORAL_TABLET | Freq: Four times a day (QID) | ORAL | 0 refills | Status: DC | PRN

## 2021-07-25 DIAGNOSIS — I351 Nonrheumatic aortic (valve) insufficiency: Secondary | ICD-10-CM | POA: Diagnosis not present

## 2021-07-25 DIAGNOSIS — Z952 Presence of prosthetic heart valve: Secondary | ICD-10-CM | POA: Diagnosis not present

## 2021-07-25 DIAGNOSIS — I350 Nonrheumatic aortic (valve) stenosis: Secondary | ICD-10-CM | POA: Diagnosis not present

## 2021-07-25 DIAGNOSIS — I10 Essential (primary) hypertension: Secondary | ICD-10-CM | POA: Diagnosis not present

## 2021-07-27 ENCOUNTER — Other Ambulatory Visit: Payer: Self-pay | Admitting: Family Medicine

## 2021-07-27 DIAGNOSIS — M5137 Other intervertebral disc degeneration, lumbosacral region: Secondary | ICD-10-CM

## 2021-07-27 NOTE — Telephone Encounter (Signed)
° °   Requested medication (s) are due for refill today: filled 12/30 for 60 so could be time  Requested medication (s) are on the active medication list: yes  Last refill:  07/13/21  Future visit scheduled: no  Notes to clinic:  This medication can not be delegated, please assess.  Requested Prescriptions  Pending Prescriptions Disp Refills   HYDROcodone-acetaminophen (NORCO/VICODIN) 5-325 MG tablet 60 tablet 0    Sig: Take 1-2 tablets by mouth every 6 (six) hours as needed for moderate pain. PATIENT NEEDS TO SCHEDULE OFFICE VISIT FOR FOLLOW UP     Not Delegated - Analgesics:  Opioid Agonist Combinations Failed - 07/27/2021  1:51 PM      Failed - This refill cannot be delegated      Failed - Urine Drug Screen completed in last 360 days      Failed - Valid encounter within last 6 months    Recent Outpatient Visits           8 months ago Need for tetanus, diphtheria, and acellular pertussis (Tdap) vaccine in patient of adolescent age or older   [facility] Fisher, Demetrios Isaacs, MD   11 months ago Psoriasis   [facility] Osvaldo Angst M, [place]   2 years ago Prostate cancer screening   [facility] Malva Limes, MD   2 years ago Need for shingles vaccine   [facility] Osvaldo Angst M, [place]   3 years ago Hyperlipidemia, mixed   [facility] Malva Limes, MD

## 2021-08-06 DIAGNOSIS — I2510 Atherosclerotic heart disease of native coronary artery without angina pectoris: Secondary | ICD-10-CM | POA: Diagnosis not present

## 2021-08-10 ENCOUNTER — Other Ambulatory Visit: Payer: Self-pay | Admitting: Family Medicine

## 2021-08-10 DIAGNOSIS — M5137 Other intervertebral disc degeneration, lumbosacral region: Secondary | ICD-10-CM

## 2021-08-11 ENCOUNTER — Other Ambulatory Visit: Payer: Self-pay | Admitting: Family Medicine

## 2021-08-11 DIAGNOSIS — M5137 Other intervertebral disc degeneration, lumbosacral region: Secondary | ICD-10-CM

## 2021-08-12 ENCOUNTER — Other Ambulatory Visit: Payer: Self-pay | Admitting: Family Medicine

## 2021-08-12 DIAGNOSIS — M5137 Other intervertebral disc degeneration, lumbosacral region: Secondary | ICD-10-CM

## 2021-08-13 MED ORDER — HYDROCODONE-ACETAMINOPHEN 5-325 MG PO TABS: 1.0000 | ORAL_TABLET | Freq: Four times a day (QID) | ORAL | 0 refills | Status: DC | PRN

## 2021-08-24 ENCOUNTER — Other Ambulatory Visit: Payer: Self-pay | Admitting: Family Medicine

## 2021-08-24 DIAGNOSIS — M5137 Other intervertebral disc degeneration, lumbosacral region: Secondary | ICD-10-CM

## 2021-08-27 ENCOUNTER — Encounter: Payer: Self-pay | Admitting: Family Medicine

## 2021-08-27 ENCOUNTER — Other Ambulatory Visit: Payer: Self-pay | Admitting: Family Medicine

## 2021-08-27 DIAGNOSIS — M5137 Other intervertebral disc degeneration, lumbosacral region: Secondary | ICD-10-CM

## 2021-08-27 NOTE — Telephone Encounter (Signed)
Last refill: 08/13/2021 (COURTESY Refill) #60 with 0 refills  Last office visit: 11/03/2020 Next office visit: 09/05/2021

## 2021-08-28 ENCOUNTER — Encounter: Payer: Self-pay | Admitting: Family Medicine

## 2021-08-28 MED ORDER — HYDROCODONE-ACETAMINOPHEN 5-325 MG PO TABS: 1.0000 | ORAL_TABLET | Freq: Four times a day (QID) | ORAL | 0 refills | Status: DC | PRN

## 2021-09-03 DIAGNOSIS — I2510 Atherosclerotic heart disease of native coronary artery without angina pectoris: Secondary | ICD-10-CM | POA: Diagnosis not present

## 2021-09-05 ENCOUNTER — Telehealth: Payer: Self-pay

## 2021-09-05 ENCOUNTER — Encounter: Payer: Self-pay | Admitting: Family Medicine

## 2021-09-05 ENCOUNTER — Ambulatory Visit: Payer: BC Managed Care – PPO | Admitting: Family Medicine

## 2021-09-05 ENCOUNTER — Other Ambulatory Visit: Payer: Self-pay

## 2021-09-05 VITALS — BP 167/79 | HR 65 | Temp 98.2°F | Resp 14 | Wt 211.0 lb

## 2021-09-05 DIAGNOSIS — E559 Vitamin D deficiency, unspecified: Secondary | ICD-10-CM | POA: Diagnosis not present

## 2021-09-05 DIAGNOSIS — F1190 Opioid use, unspecified, uncomplicated: Secondary | ICD-10-CM | POA: Diagnosis not present

## 2021-09-05 DIAGNOSIS — R739 Hyperglycemia, unspecified: Secondary | ICD-10-CM | POA: Diagnosis not present

## 2021-09-05 DIAGNOSIS — M542 Cervicalgia: Secondary | ICD-10-CM

## 2021-09-05 DIAGNOSIS — I10 Essential (primary) hypertension: Secondary | ICD-10-CM

## 2021-09-05 DIAGNOSIS — M5137 Other intervertebral disc degeneration, lumbosacral region: Secondary | ICD-10-CM

## 2021-09-05 DIAGNOSIS — Z952 Presence of prosthetic heart valve: Secondary | ICD-10-CM

## 2021-09-05 DIAGNOSIS — Z125 Encounter for screening for malignant neoplasm of prostate: Secondary | ICD-10-CM

## 2021-09-05 DIAGNOSIS — D539 Nutritional anemia, unspecified: Secondary | ICD-10-CM | POA: Diagnosis not present

## 2021-09-05 DIAGNOSIS — M545 Low back pain, unspecified: Secondary | ICD-10-CM

## 2021-09-05 DIAGNOSIS — G8929 Other chronic pain: Secondary | ICD-10-CM

## 2021-09-05 DIAGNOSIS — Z1211 Encounter for screening for malignant neoplasm of colon: Secondary | ICD-10-CM

## 2021-09-05 DIAGNOSIS — H6122 Impacted cerumen, left ear: Secondary | ICD-10-CM

## 2021-09-05 MED ORDER — CYCLOBENZAPRINE HCL 10 MG PO TABS
10.0000 mg | ORAL_TABLET | Freq: Three times a day (TID) | ORAL | 0 refills | Status: DC | PRN
Start: 2021-09-05 — End: 2021-09-05

## 2021-09-05 MED ORDER — CYCLOBENZAPRINE HCL 10 MG PO TABS: 10.0000 mg | ORAL_TABLET | Freq: Three times a day (TID) | ORAL | 0 refills | Status: AC | PRN

## 2021-09-05 NOTE — Telephone Encounter (Signed)
I called patient to clarify message below. Patient wants Dr. Sherrie Mustache to send the prescriptions that were prescribed today during his office visit to [facility] in [place]. He says, it will take about 10 days before [facility] Patient is unsure of the name of the medications that was prescribed today.

## 2021-09-05 NOTE — Progress Notes (Signed)
I,Roshena L Chambers,acting as a scribe for Lelon Huh, MD.,have documented all relevant documentation on the behalf of Lelon Huh, MD,as directed by  Lelon Huh, MD while in the presence of Lelon Huh, MD.   Established patient visit   Patient: Isaac Hancock   DOB: Sep 12, 1955   65 y.o. Male  MRN: [id number] Visit Date: 09/05/2021  Today's healthcare provider: Lelon Huh, MD   Chief Complaint  Patient presents with   Pain Management   Hypertension   Subjective    HPI  Follow up for chronic back pain:  The patient was last seen for this more than 1 year ago.   Changes made at last visit include none.  He reports good compliance with treatment. He feels that condition is Unchanged. He is not having side effects.   ------------------------------------------------------------------------------------------------   Hypertension, follow-up  BP Readings from Last 3 Encounters:  09/05/21 (!) 167/79  11/03/20 139/66  08/24/20 (!) 111/48   Wt Readings from Last 3 Encounters:  09/05/21 211 lb (95.7 kg)  11/03/20 216 lb 3.2 oz (98.1 kg)  08/24/20 228 lb (103.4 kg)     He was last seen for hypertension 1  year  ago.   Management since that visit includes continuing same medication.  He reports good compliance with treatment. He is not having side effects.  He is following a Regular diet. He is not exercising. He does not smoke.  Use of agents associated with hypertension: none.   Outside blood pressures are not checked. Symptoms: No chest pain No chest pressure  No palpitations No syncope  No dyspnea No orthopnea  No paroxysmal nocturnal dyspnea No lower extremity edema   Pertinent labs: Lab Results  Component Value Date   CHOL 252 (A) 10/04/2020   HDL 118 (A) 10/04/2020   LDLCALC 98 10/04/2020   TRIG 75 10/04/2020   CHOLHDL 2.2 07/28/2019   Lab Results  Component Value Date   NA 130 (L) 01/18/2021   K 4.1 01/18/2021   CREATININE 1.20  01/18/2021   EGFR 68 01/18/2021   GLUCOSE 108 (H) 01/18/2021   TSH 1.520 11/03/2020     The ASCVD Risk score (Arnett DK, et al., 2019) failed to calculate for the following reasons:   The valid HDL cholesterol range is 20 to 100 mg/dL    ---------------------------------------------------------------------------------------------------  He also reports that he has been having pain on the left side of his neck for several day, no radiation into arms. No arm weakness. No pain over spine. Is a little bit better today. Is on warfarin due to history of valve replacement managed by Dr. Humphrey Rolls.   He also states his left ear has felt plugged up for a few weeks. Is having some trouble hearing out of it.    Medications: Outpatient Medications Prior to Visit  Medication Sig   ALPRAZolam (XANAX) 0.5 MG tablet Take 1 tablet (0.5 mg total) by mouth every 6 (six) hours as needed.   amLODipine (NORVASC) 10 MG tablet  (Patient not taking: Reported on 11/03/2020)   atorvastatin (LIPITOR) 80 MG tablet TAKE 1 TABLET DAILY   colchicine 0.6 MG tablet Take 2 tablets (1.2 mg) on first day of gout flare, then 1 daily as needed. Reduce atorvastatin to 1/2 tablet daily while taking Colchicine   enalapril (VASOTEC) 20 MG tablet Take 20 mg by mouth 2 (two) times daily.    fexofenadine (ALLEGRA) 60 MG tablet Take 60 mg by mouth 2 (two) times daily.  furosemide (LASIX) 20 MG tablet Take 20 mg by mouth daily.    HYDROcodone-acetaminophen (NORCO/VICODIN) 5-325 MG tablet Take 1-2 tablets by mouth every 6 (six) hours as needed for moderate pain. PATIENT NEEDS OFFICE VISIT FOR FOLLOW UP   metoprolol (TOPROL-XL) 200 MG 24 hr tablet TAKE 1 TABLET DAILY   MULTIPLE VITAMIN PO Take 1 tablet by mouth daily.    NON FORMULARY    omega-3 acid ethyl esters (LOVAZA) 1 g capsule Take 2 capsules (2 g total) by mouth 2 (two) times daily.   omeprazole (PRILOSEC) 20 MG capsule TAKE 1 CAPSULE DAILY   predniSONE (DELTASONE) 1 MG tablet  Take 1 tablet (1 mg total) by mouth daily with breakfast.   Vitamin D, Ergocalciferol, (DRISDOL) 1.25 MG (50000 UNIT) CAPS capsule TAKE 1 CAPSULE ONCE WEEKLY   warfarin (COUMADIN) 6 MG tablet Take 6 mg daily by mouth.   No facility-administered medications prior to visit.    Review of Systems  Constitutional:  Negative for appetite change, chills and fever.  Respiratory:  Negative for chest tightness, shortness of breath and wheezing.   Cardiovascular:  Negative for chest pain and palpitations.  Gastrointestinal:  Negative for abdominal pain, nausea and vomiting.      Objective    BP (!) 167/79 (BP Location: Right Arm, Patient Position: Sitting, Cuff Size: Large)    Pulse 65    Temp 98.2 F (36.8 C) (Oral)    Resp 14    Wt 211 lb (95.7 kg)    SpO2 100% Comment: room air   BMI 29.43 kg/m  {Show previous vital signs (optional):23777}  Today's Vitals   09/05/21 0842 09/05/21 0847  BP: (!) 155/73 (!) 167/79  Pulse: 65   Resp: 14   Temp: 98.2 F (36.8 C)   TempSrc: Oral   SpO2: 100%   Weight: 211 lb (95.7 kg)    Body mass index is 29.43 kg/m.   Physical Exam    General: Appearance:     Overweight male in no acute distress  Eyes:    PERRL, conjunctiva/corneas clear, EOM's intact       HEENT:   Left ear canal obstructed.   Lungs:     Clear to auscultation bilaterally, respirations unlabored  Heart:    Normal heart rate. Normal rhythm. No murmurs, rubs, or gallops.  A metallic click heard   MS:   All extremities are intact.  No cervical spine tenderness. Tightness in left para spinous muscles.   Neurologic:   Awake, alert, oriented x 3. No apparent focal neurological defect.         Assessment & Plan     1. DDD (degenerative disc disease), lumbosacral   2. Chronic midline low back pain, unspecified whether sciatica present   3. Chronic, continuous use of opioids  - Pain Mgt Scrn (14 Drugs), Ur  Avoiding steroids due to history of adrenal insufficiency when he was  on redcurrant steroid tapers in the past.   4. Vitamin D deficiency  - VITAMIN D 25 Hydroxy (Vit-D Deficiency, Fractures)  5. Primary hypertension Is usually much better controlled. Also followed by Dr. Humphrey Rolls. See how labs look. Consider adding thiazide or spironolactone if remains elevated.  - CBC - Comprehensive metabolic panel - Lipid panel  6. Acute neck pain/strain - cyclobenzaprine (FLEXERIL) 10 MG tablet; Take 1 tablet (10 mg total) by mouth 3 (three) times daily as needed for muscle spasms.  Dispense: 30 tablet; Refill: 0   7. H/O aortic valve replacement  On warfarin managed by Dr. Humphrey Rolls  8. Prostate cancer screening  - PSA Total (Reflex To Free) ([facility] only)  9. Colon cancer screening  - Cologuard  10. Impacted cerumen of left ear He can try OTC Debrox, or return another time for irrigation of ear canal.         The entirety of the information documented in the History of Present Illness, Review of Systems and Physical Exam were personally obtained by me. Portions of this information were initially documented by the CMA and reviewed by me for thoroughness and accuracy.     Lelon Huh, MD  [facility] [phone] (phone) [phone] (fax)  [place]

## 2021-09-05 NOTE — Telephone Encounter (Signed)
Copied from CRM [id number]. Topic: Quick Communication - Rx Refill/Question >> Sep 05, 2021  9:56 AM Pawlus, Maxine Glenn A wrote: Pt stated he was just seen in office today 2/22 and needed his prescriptions sent to [id number] - [place], [place], Pt stated it takes too long to get his medications [facility], please advise.

## 2021-09-06 LAB — PSA TOTAL (REFLEX TO FREE): Prostate Specific Ag, Serum: 0.4 ng/mL (ref 0.0–4.0)

## 2021-09-06 LAB — CBC
Hematocrit: 35.1 % — ABNORMAL LOW (ref 37.5–51.0)
Hemoglobin: 12.3 g/dL — ABNORMAL LOW (ref 13.0–17.7)
MCH: 34.3 pg — ABNORMAL HIGH (ref 26.6–33.0)
MCHC: 35 g/dL (ref 31.5–35.7)
MCV: 98 fL — ABNORMAL HIGH (ref 79–97)
Platelets: 163 10*3/uL (ref 150–450)
RBC: 3.59 x10E6/uL — ABNORMAL LOW (ref 4.14–5.80)
RDW: 13.1 % (ref 11.6–15.4)
WBC: 5.7 10*3/uL (ref 3.4–10.8)

## 2021-09-06 LAB — PAIN MGT SCRN (14 DRUGS), UR
Amphetamine Scrn, Ur: NEGATIVE ng/mL
BARBITURATE SCREEN URINE: NEGATIVE ng/mL
BENZODIAZEPINE SCREEN, URINE: NEGATIVE ng/mL
Buprenorphine, Urine: NEGATIVE ng/mL
CANNABINOIDS UR QL SCN: NEGATIVE ng/mL
Cocaine (Metab) Scrn, Ur: NEGATIVE ng/mL
Creatinine(Crt), U: 69.1 mg/dL (ref 20.0–300.0)
Fentanyl, Urine: NEGATIVE pg/mL
Meperidine Screen, Urine: NEGATIVE ng/mL
Methadone Screen, Urine: NEGATIVE ng/mL
OXYCODONE+OXYMORPHONE UR QL SCN: NEGATIVE ng/mL
Opiate Scrn, Ur: POSITIVE ng/mL — AB
Ph of Urine: 5.6 (ref 4.5–8.9)
Phencyclidine Qn, Ur: NEGATIVE ng/mL
Propoxyphene Scrn, Ur: NEGATIVE ng/mL
Tramadol Screen, Urine: NEGATIVE ng/mL

## 2021-09-06 LAB — COMPREHENSIVE METABOLIC PANEL
ALT: 18 IU/L (ref 0–44)
AST: 34 IU/L (ref 0–40)
Albumin/Globulin Ratio: 2 (ref 1.2–2.2)
Albumin: 4.3 g/dL (ref 3.8–4.8)
Alkaline Phosphatase: 91 IU/L (ref 44–121)
BUN/Creatinine Ratio: 14 (ref 10–24)
BUN: 13 mg/dL (ref 8–27)
Bilirubin Total: 0.7 mg/dL (ref 0.0–1.2)
CO2: 26 mmol/L (ref 20–29)
Calcium: 9.7 mg/dL (ref 8.6–10.2)
Chloride: 95 mmol/L — ABNORMAL LOW (ref 96–106)
Creatinine, Ser: 0.92 mg/dL (ref 0.76–1.27)
Globulin, Total: 2.2 g/dL (ref 1.5–4.5)
Glucose: 117 mg/dL — ABNORMAL HIGH (ref 70–99)
Potassium: 4.8 mmol/L (ref 3.5–5.2)
Sodium: 135 mmol/L (ref 134–144)
Total Protein: 6.5 g/dL (ref 6.0–8.5)
eGFR: 93 mL/min/{1.73_m2} (ref >59)

## 2021-09-06 LAB — LIPID PANEL
Chol/HDL Ratio: 3.2 ratio (ref 0.0–5.0)
Cholesterol, Total: 208 mg/dL — ABNORMAL HIGH (ref 100–199)
HDL: 66 mg/dL (ref >39)
LDL Chol Calc (NIH): 122 mg/dL — ABNORMAL HIGH (ref 0–99)
Triglycerides: 114 mg/dL (ref 0–149)
VLDL Cholesterol Cal: 20 mg/dL (ref 5–40)

## 2021-09-06 LAB — VITAMIN D 25 HYDROXY (VIT D DEFICIENCY, FRACTURES): Vit D, 25-Hydroxy: 73.8 ng/mL (ref 30.0–100.0)

## 2021-09-07 ENCOUNTER — Other Ambulatory Visit: Payer: Self-pay | Admitting: Family Medicine

## 2021-09-07 DIAGNOSIS — M5137 Other intervertebral disc degeneration, lumbosacral region: Secondary | ICD-10-CM

## 2021-09-07 LAB — HGB A1C W/O EAG: Hgb A1c MFr Bld: 5.6 % (ref 4.8–5.6)

## 2021-09-07 LAB — SPECIMEN STATUS REPORT

## 2021-09-07 LAB — VITAMIN B12: Vitamin B-12: 543 pg/mL (ref 232–1245)

## 2021-09-08 ENCOUNTER — Other Ambulatory Visit: Payer: Self-pay | Admitting: Family Medicine

## 2021-09-08 DIAGNOSIS — M5137 Other intervertebral disc degeneration, lumbosacral region: Secondary | ICD-10-CM

## 2021-09-10 ENCOUNTER — Encounter: Payer: Self-pay | Admitting: Family Medicine

## 2021-09-10 MED ORDER — HYDROCODONE-ACETAMINOPHEN 5-325 MG PO TABS: 1.0000 | ORAL_TABLET | Freq: Four times a day (QID) | ORAL | 0 refills | Status: DC | PRN

## 2021-09-10 NOTE — Telephone Encounter (Signed)
Last refill: 08/28/2021 #60 with 0 refills  Last office visit: 09/05/2021 Next office visit: No future visit scheduled

## 2021-09-11 ENCOUNTER — Encounter: Payer: Self-pay | Admitting: Family Medicine

## 2021-09-11 DIAGNOSIS — F419 Anxiety disorder, unspecified: Secondary | ICD-10-CM

## 2021-09-12 MED ORDER — ALPRAZOLAM 0.5 MG PO TABS: 0.5000 mg | ORAL_TABLET | Freq: Four times a day (QID) | ORAL | 5 refills | Status: DC | PRN

## 2021-09-12 NOTE — Telephone Encounter (Signed)
Please review.  ?Prednisone is not listed on medication list.  ? ?Last refill for Xanax: 07/12/2020 #60 with 5 refills ?Last office visit: 09/05/2021 ?No future visit scheduled.  ?

## 2021-09-16 ENCOUNTER — Other Ambulatory Visit: Payer: Self-pay | Admitting: Family Medicine

## 2021-09-21 ENCOUNTER — Other Ambulatory Visit: Payer: Self-pay | Admitting: Family Medicine

## 2021-09-21 DIAGNOSIS — M5137 Other intervertebral disc degeneration, lumbosacral region: Secondary | ICD-10-CM

## 2021-09-21 NOTE — Telephone Encounter (Signed)
Last refill: 09/10/2021 #60 with 0 refills ? ?Last office visit: 09/05/2021 ?No future visit scheduled ?

## 2021-09-23 ENCOUNTER — Other Ambulatory Visit: Payer: Self-pay | Admitting: Family Medicine

## 2021-09-23 DIAGNOSIS — M5137 Other intervertebral disc degeneration, lumbosacral region: Secondary | ICD-10-CM

## 2021-09-23 MED ORDER — HYDROCODONE-ACETAMINOPHEN 5-325 MG PO TABS: 1.0000 | ORAL_TABLET | Freq: Four times a day (QID) | ORAL | 0 refills | Status: DC | PRN

## 2021-09-26 DIAGNOSIS — Z1211 Encounter for screening for malignant neoplasm of colon: Secondary | ICD-10-CM | POA: Diagnosis not present

## 2021-10-04 LAB — COLOGUARD: COLOGUARD: NEGATIVE

## 2021-10-05 ENCOUNTER — Other Ambulatory Visit: Payer: Self-pay | Admitting: Family Medicine

## 2021-10-05 DIAGNOSIS — M5137 Other intervertebral disc degeneration, lumbosacral region: Secondary | ICD-10-CM

## 2021-10-06 MED ORDER — HYDROCODONE-ACETAMINOPHEN 5-325 MG PO TABS: 1.0000 | ORAL_TABLET | Freq: Four times a day (QID) | ORAL | 0 refills | Status: DC | PRN

## 2021-10-19 ENCOUNTER — Other Ambulatory Visit: Payer: Self-pay | Admitting: Family Medicine

## 2021-10-19 DIAGNOSIS — M5137 Other intervertebral disc degeneration, lumbosacral region: Secondary | ICD-10-CM

## 2021-10-19 MED ORDER — HYDROCODONE-ACETAMINOPHEN 5-325 MG PO TABS: 1.0000 | ORAL_TABLET | Freq: Four times a day (QID) | ORAL | 0 refills | Status: DC | PRN

## 2021-10-23 DIAGNOSIS — R0602 Shortness of breath: Secondary | ICD-10-CM | POA: Diagnosis not present

## 2021-10-23 DIAGNOSIS — I10 Essential (primary) hypertension: Secondary | ICD-10-CM | POA: Diagnosis not present

## 2021-10-23 DIAGNOSIS — I350 Nonrheumatic aortic (valve) stenosis: Secondary | ICD-10-CM | POA: Diagnosis not present

## 2021-10-23 DIAGNOSIS — I2510 Atherosclerotic heart disease of native coronary artery without angina pectoris: Secondary | ICD-10-CM | POA: Diagnosis not present

## 2021-10-23 DIAGNOSIS — I351 Nonrheumatic aortic (valve) insufficiency: Secondary | ICD-10-CM | POA: Diagnosis not present

## 2021-10-30 ENCOUNTER — Encounter: Payer: Self-pay | Admitting: Family Medicine

## 2021-11-02 ENCOUNTER — Other Ambulatory Visit: Payer: Self-pay | Admitting: Family Medicine

## 2021-11-02 DIAGNOSIS — M5137 Other intervertebral disc degeneration, lumbosacral region: Secondary | ICD-10-CM

## 2021-11-03 MED ORDER — HYDROCODONE-ACETAMINOPHEN 5-325 MG PO TABS: 1.0000 | ORAL_TABLET | Freq: Four times a day (QID) | ORAL | 0 refills | Status: DC | PRN

## 2021-11-16 ENCOUNTER — Other Ambulatory Visit: Payer: Self-pay | Admitting: Family Medicine

## 2021-11-16 DIAGNOSIS — M5137 Other intervertebral disc degeneration, lumbosacral region: Secondary | ICD-10-CM

## 2021-11-16 MED ORDER — HYDROCODONE-ACETAMINOPHEN 5-325 MG PO TABS: 1.0000 | ORAL_TABLET | Freq: Four times a day (QID) | ORAL | 0 refills | Status: DC | PRN

## 2021-11-25 ENCOUNTER — Other Ambulatory Visit: Payer: Self-pay | Admitting: Family Medicine

## 2021-11-25 DIAGNOSIS — I10 Essential (primary) hypertension: Secondary | ICD-10-CM

## 2021-11-25 NOTE — Telephone Encounter (Signed)
Please advise patient is overdue for follow up. Have sent 30 day prescription of metoprolol to his mail order, but he needs office visit before any additional refills can be approved.  ?

## 2021-11-30 ENCOUNTER — Other Ambulatory Visit: Payer: Self-pay | Admitting: Family Medicine

## 2021-11-30 DIAGNOSIS — M5137 Other intervertebral disc degeneration, lumbosacral region: Secondary | ICD-10-CM

## 2021-12-01 ENCOUNTER — Other Ambulatory Visit: Payer: Self-pay | Admitting: Family Medicine

## 2021-12-01 DIAGNOSIS — M5137 Other intervertebral disc degeneration, lumbosacral region: Secondary | ICD-10-CM

## 2021-12-02 MED ORDER — HYDROCODONE-ACETAMINOPHEN 5-325 MG PO TABS: 1.0000 | ORAL_TABLET | Freq: Four times a day (QID) | ORAL | 0 refills | Status: DC | PRN

## 2021-12-14 ENCOUNTER — Other Ambulatory Visit: Payer: Self-pay | Admitting: Family Medicine

## 2021-12-14 DIAGNOSIS — M5137 Other intervertebral disc degeneration, lumbosacral region: Secondary | ICD-10-CM

## 2021-12-15 ENCOUNTER — Other Ambulatory Visit: Payer: Self-pay | Admitting: Family Medicine

## 2021-12-15 DIAGNOSIS — M5137 Other intervertebral disc degeneration, lumbosacral region: Secondary | ICD-10-CM

## 2021-12-16 ENCOUNTER — Encounter: Payer: Self-pay | Admitting: Family Medicine

## 2021-12-17 MED ORDER — HYDROCODONE-ACETAMINOPHEN 5-325 MG PO TABS: 1.0000 | ORAL_TABLET | Freq: Four times a day (QID) | ORAL | 0 refills | Status: DC | PRN

## 2021-12-23 ENCOUNTER — Other Ambulatory Visit: Payer: Self-pay | Admitting: Family Medicine

## 2021-12-27 ENCOUNTER — Other Ambulatory Visit: Payer: Self-pay | Admitting: Family Medicine

## 2021-12-27 DIAGNOSIS — M5137 Other intervertebral disc degeneration, lumbosacral region: Secondary | ICD-10-CM

## 2021-12-28 ENCOUNTER — Other Ambulatory Visit: Payer: Self-pay | Admitting: Family Medicine

## 2021-12-28 DIAGNOSIS — M5137 Other intervertebral disc degeneration, lumbosacral region: Secondary | ICD-10-CM

## 2021-12-30 ENCOUNTER — Other Ambulatory Visit: Payer: Self-pay | Admitting: Family Medicine

## 2021-12-30 ENCOUNTER — Encounter: Payer: Self-pay | Admitting: Family Medicine

## 2021-12-30 DIAGNOSIS — M5137 Other intervertebral disc degeneration, lumbosacral region: Secondary | ICD-10-CM

## 2021-12-31 MED ORDER — HYDROCODONE-ACETAMINOPHEN 5-325 MG PO TABS
1.0000 | ORAL_TABLET | Freq: Four times a day (QID) | ORAL | 0 refills | Status: DC | PRN
Start: 2021-12-31 — End: 2022-01-11

## 2022-01-11 ENCOUNTER — Other Ambulatory Visit: Payer: Self-pay | Admitting: Family Medicine

## 2022-01-11 DIAGNOSIS — M5137 Other intervertebral disc degeneration, lumbosacral region: Secondary | ICD-10-CM

## 2022-01-11 MED ORDER — HYDROCODONE-ACETAMINOPHEN 5-325 MG PO TABS: 1.0000 | ORAL_TABLET | Freq: Four times a day (QID) | ORAL | 0 refills | Status: DC | PRN

## 2022-01-12 ENCOUNTER — Encounter: Payer: Self-pay | Admitting: Family Medicine

## 2022-01-14 DIAGNOSIS — R0602 Shortness of breath: Secondary | ICD-10-CM | POA: Diagnosis not present

## 2022-01-14 DIAGNOSIS — I2510 Atherosclerotic heart disease of native coronary artery without angina pectoris: Secondary | ICD-10-CM | POA: Diagnosis not present

## 2022-01-17 ENCOUNTER — Other Ambulatory Visit: Payer: Self-pay | Admitting: Family Medicine

## 2022-01-17 DIAGNOSIS — M5137 Other intervertebral disc degeneration, lumbosacral region: Secondary | ICD-10-CM

## 2022-01-18 ENCOUNTER — Encounter: Payer: Self-pay | Admitting: Family Medicine

## 2022-01-18 ENCOUNTER — Ambulatory Visit: Payer: BC Managed Care – PPO | Admitting: Family Medicine

## 2022-01-18 VITALS — BP 140/82 | HR 64 | Temp 98.5°F | Wt 213.0 lb

## 2022-01-18 DIAGNOSIS — D539 Nutritional anemia, unspecified: Secondary | ICD-10-CM | POA: Diagnosis not present

## 2022-01-18 DIAGNOSIS — R739 Hyperglycemia, unspecified: Secondary | ICD-10-CM

## 2022-01-18 MED ORDER — HYDROCODONE-ACETAMINOPHEN 5-325 MG PO TABS: 1.0000 | ORAL_TABLET | Freq: Four times a day (QID) | ORAL | 0 refills | Status: DC | PRN

## 2022-01-18 NOTE — Progress Notes (Unsigned)
I,Roshena L Chambers,acting as a scribe for Lelon Huh, MD.,have documented all relevant documentation on the behalf of Lelon Huh, MD,as directed by  Lelon Huh, MD while in the presence of Lelon Huh, MD.    Established patient visit   Patient: Isaac Hancock   DOB: August 31, 1955   66 y.o. Male  MRN: [id number] Visit Date: 01/18/2022  Today's healthcare provider: Lelon Huh, MD   Chief Complaint  Patient presents with   Medication Problem   Pain Management   Subjective    HPI  Follow up for chronic pain:  The patient was last seen for this 4 months ago. Changes made at last visit include none; continue same medication regiment.  He reports good compliance with treatment. He feels that condition is Unchanged. He is not having side effects.   -----------------------------------------------------------------------------------------   Hypertension, follow-up  BP Readings from Last 3 Encounters:  01/18/22 (!) 184/68  09/05/21 (!) 167/79  11/03/20 139/66   Wt Readings from Last 3 Encounters:  01/18/22 213 lb (96.6 kg)  09/05/21 211 lb (95.7 kg)  11/03/20 216 lb 3.2 oz (98.1 kg)     He was last seen for hypertension 4 months ago.  BP at that visit was 167/79. Management since that visit includes continue same medication.  He reports good compliance with treatment. He is not having side effects.  He is following a Regular diet. He is not exercising. He does not smoke.  Use of agents associated with hypertension: none.   Outside blood pressures are not checked. Symptoms: No chest pain No chest pressure  No palpitations No syncope  No dyspnea No orthopnea  No paroxysmal nocturnal dyspnea No lower extremity edema   Pertinent labs Lab Results  Component Value Date   CHOL 208 (H) 09/05/2021   HDL 66 09/05/2021   LDLCALC 122 (H) 09/05/2021   TRIG 114 09/05/2021   CHOLHDL 3.2 09/05/2021   Lab Results  Component Value Date   NA 135 09/05/2021    K 4.8 09/05/2021   CREATININE 0.92 09/05/2021   EGFR 93 09/05/2021   GLUCOSE 117 (H) 09/05/2021   TSH 1.520 11/03/2020     The 10-year ASCVD risk score (Arnett DK, et al., 2019) is: 21.7%  ---------------------------------------------------------------------------------------------------   Medications: Outpatient Medications Prior to Visit  Medication Sig   ALPRAZolam (XANAX) 0.5 MG tablet Take 1 tablet (0.5 mg total) by mouth every 6 (six) hours as needed.   atorvastatin (LIPITOR) 80 MG tablet TAKE 1 TABLET DAILY   colchicine 0.6 MG tablet Take 2 tablets (1.2 mg) on first day of gout flare, then 1 daily as needed. Reduce atorvastatin to 1/2 tablet daily while taking Colchicine   cyclobenzaprine (FLEXERIL) 10 MG tablet Take 1 tablet (10 mg total) by mouth 3 (three) times daily as needed for muscle spasms.   enalapril (VASOTEC) 20 MG tablet Take 20 mg by mouth 2 (two) times daily.    fexofenadine (ALLEGRA) 60 MG tablet Take 60 mg by mouth 2 (two) times daily.    furosemide (LASIX) 20 MG tablet Take 20 mg by mouth daily.    HYDROcodone-acetaminophen (NORCO/VICODIN) 5-325 MG tablet Take 1-2 tablets by mouth every 6 (six) hours as needed for moderate pain.   metoprolol (TOPROL-XL) 200 MG 24 hr tablet TAKE 1 TABLET DAILY   MULTIPLE VITAMIN PO Take 1 tablet by mouth daily.    NON FORMULARY    omega-3 acid ethyl esters (LOVAZA) 1 g capsule TAKE 2 CAPSULES TWICE A  DAY   omeprazole (PRILOSEC) 20 MG capsule TAKE 1 CAPSULE DAILY   Vitamin D, Ergocalciferol, (DRISDOL) 1.25 MG (50000 UNIT) CAPS capsule TAKE 1 CAPSULE ONCE WEEKLY   warfarin (COUMADIN) 6 MG tablet Take 6 mg daily by mouth.   amLODipine (NORVASC) 10 MG tablet  (Patient not taking: Reported on 11/03/2020)   No facility-administered medications prior to visit.    Review of Systems  Constitutional:  Negative for appetite change, chills and fever.  Respiratory:  Negative for chest tightness, shortness of breath and wheezing.    Cardiovascular:  Negative for chest pain and palpitations.  Gastrointestinal:  Negative for abdominal pain, nausea and vomiting.    {Labs  Heme  Chem  Endocrine  Serology  Results Review (optional):23779}   Objective    BP (!) 184/68   Pulse 64   Temp 98.5 F (36.9 C) (Oral)   Wt 213 lb (96.6 kg)   SpO2 99% Comment: room air  BMI 29.71 kg/m  {Show previous vital signs (optional):23777} Today's Vitals   01/18/22 1531  BP: (!) 184/68  Pulse: 64  Temp: 98.5 F (36.9 C)  TempSrc: Oral  SpO2: 99%  Weight: 213 lb (96.6 kg)   Body mass index is 29.71 kg/m.    Physical Exam   General: Appearance:    Well developed, well nourished male in no acute distress  Eyes:    PERRL, conjunctiva/corneas clear, EOM's intact       Lungs:     Clear to auscultation bilaterally, respirations unlabored  Heart:    Normal heart rate. Normal rhythm. No murmurs, rubs, or gallops.   MS:   All extremities are intact.    Neurologic:   Awake, alert, oriented x 3. No apparent focal neurological defect.         Assessment & Plan     1. Anemia associated with nutritional deficiency  - CBC - Iron, TIBC and Ferritin Panel  2. Hyperglycemia  - Hemoglobin D4Y - Basic metabolic panel       The entirety of the information documented in the History of Present Illness, Review of Systems and Physical Exam were personally obtained by me. Portions of this information were initially documented by the CMA and reviewed by me for thoroughness and accuracy.     Lelon Huh, MD  [facility] [phone] (phone) [phone] (fax)  [place]

## 2022-01-19 LAB — CBC
Hematocrit: 36.1 % — ABNORMAL LOW (ref 37.5–51.0)
Hemoglobin: 12.5 g/dL — ABNORMAL LOW (ref 13.0–17.7)
MCH: 34 pg — ABNORMAL HIGH (ref 26.6–33.0)
MCHC: 34.6 g/dL (ref 31.5–35.7)
MCV: 98 fL — ABNORMAL HIGH (ref 79–97)
Platelets: 135 10*3/uL — ABNORMAL LOW (ref 150–450)
RBC: 3.68 x10E6/uL — ABNORMAL LOW (ref 4.14–5.80)
RDW: 12.5 % (ref 11.6–15.4)
WBC: 6.6 10*3/uL (ref 3.4–10.8)

## 2022-01-19 LAB — BASIC METABOLIC PANEL
BUN/Creatinine Ratio: 15 (ref 10–24)
BUN: 18 mg/dL (ref 8–27)
CO2: 26 mmol/L (ref 20–29)
Calcium: 9.4 mg/dL (ref 8.6–10.2)
Chloride: 96 mmol/L (ref 96–106)
Creatinine, Ser: 1.18 mg/dL (ref 0.76–1.27)
Glucose: 116 mg/dL — ABNORMAL HIGH (ref 70–99)
Potassium: 4.4 mmol/L (ref 3.5–5.2)
Sodium: 135 mmol/L (ref 134–144)
eGFR: 69 mL/min/{1.73_m2} (ref >59)

## 2022-01-19 LAB — HEMOGLOBIN A1C
Est. average glucose Bld gHb Est-mCnc: 117 mg/dL
Hgb A1c MFr Bld: 5.7 % — ABNORMAL HIGH (ref 4.8–5.6)

## 2022-01-19 LAB — IRON,TIBC AND FERRITIN PANEL
Ferritin: 114 ng/mL (ref 30–400)
Iron Saturation: 24 % (ref 15–55)
Iron: 68 ug/dL (ref 38–169)
Total Iron Binding Capacity: 288 ug/dL (ref 250–450)
UIBC: 220 ug/dL (ref 111–343)

## 2022-01-22 DIAGNOSIS — I350 Nonrheumatic aortic (valve) stenosis: Secondary | ICD-10-CM | POA: Diagnosis not present

## 2022-01-22 DIAGNOSIS — I340 Nonrheumatic mitral (valve) insufficiency: Secondary | ICD-10-CM | POA: Diagnosis not present

## 2022-01-22 DIAGNOSIS — I351 Nonrheumatic aortic (valve) insufficiency: Secondary | ICD-10-CM | POA: Diagnosis not present

## 2022-01-22 DIAGNOSIS — I2510 Atherosclerotic heart disease of native coronary artery without angina pectoris: Secondary | ICD-10-CM | POA: Diagnosis not present

## 2022-01-22 DIAGNOSIS — I10 Essential (primary) hypertension: Secondary | ICD-10-CM | POA: Diagnosis not present

## 2022-01-22 DIAGNOSIS — I060 Rheumatic aortic stenosis: Secondary | ICD-10-CM | POA: Diagnosis not present

## 2022-01-23 ENCOUNTER — Ambulatory Visit: Payer: BC Managed Care – PPO | Admitting: Family Medicine

## 2022-01-31 ENCOUNTER — Other Ambulatory Visit: Payer: Self-pay | Admitting: Family Medicine

## 2022-01-31 DIAGNOSIS — M5137 Other intervertebral disc degeneration, lumbosacral region: Secondary | ICD-10-CM

## 2022-01-31 MED ORDER — HYDROCODONE-ACETAMINOPHEN 5-325 MG PO TABS: 1.0000 | ORAL_TABLET | Freq: Four times a day (QID) | ORAL | 0 refills | Status: DC | PRN

## 2022-02-14 ENCOUNTER — Other Ambulatory Visit: Payer: Self-pay | Admitting: Family Medicine

## 2022-02-14 DIAGNOSIS — M5137 Other intervertebral disc degeneration, lumbosacral region: Secondary | ICD-10-CM

## 2022-02-14 MED ORDER — HYDROCODONE-ACETAMINOPHEN 5-325 MG PO TABS: 1.0000 | ORAL_TABLET | Freq: Four times a day (QID) | ORAL | 0 refills | Status: DC | PRN

## 2022-02-17 ENCOUNTER — Other Ambulatory Visit: Payer: Self-pay | Admitting: Family Medicine

## 2022-02-17 DIAGNOSIS — I10 Essential (primary) hypertension: Secondary | ICD-10-CM

## 2022-02-27 ENCOUNTER — Other Ambulatory Visit: Payer: Self-pay | Admitting: Family Medicine

## 2022-02-27 DIAGNOSIS — M5137 Other intervertebral disc degeneration, lumbosacral region: Secondary | ICD-10-CM

## 2022-02-27 MED ORDER — HYDROCODONE-ACETAMINOPHEN 5-325 MG PO TABS: 1.0000 | ORAL_TABLET | Freq: Four times a day (QID) | ORAL | 0 refills | Status: DC | PRN

## 2022-02-27 NOTE — Progress Notes (Deleted)
   Established Patient Office Visit  Subjective   Patient ID: Isaac Hancock, male    DOB: 09-29-1955  Age: 66 y.o. MRN: [id number]  Chief Complaint  Patient presents with   Medication Problem   Pain Management    HPI  {History (Optional):23778}  ROS    Objective:     BP 140/82   Pulse 64   Temp 98.5 F (36.9 C) (Oral)   Wt 213 lb (96.6 kg)   SpO2 99% Comment: room air  BMI 29.71 kg/m  {Vitals History (Optional):23777}  Physical Exam    General: Appearance:    Well developed, well nourished male in no acute distress  Eyes:    PERRL, conjunctiva/corneas clear, EOM's intact       Lungs:     Clear to auscultation bilaterally, respirations unlabored  Heart:    Normal heart rate. Normal rhythm. No murmurs, rubs, or gallops.  A metallic click heard   MS:   All extremities are intact.    Neurologic:   Awake, alert, oriented x 3. No apparent focal neurological defect.          Last hemoglobin A1c Lab Results  Component Value Date   HGBA1C 5.7 (H) 01/18/2022          Assessment & Plan:      No follow-ups on file.    Mila Merry, MD

## 2022-03-02 ENCOUNTER — Other Ambulatory Visit: Payer: Self-pay | Admitting: Family Medicine

## 2022-03-02 DIAGNOSIS — M109 Gout, unspecified: Secondary | ICD-10-CM

## 2022-03-03 NOTE — Patient Instructions (Signed)
.   Please review the attached list of medications and notify my office if there are any errors.   . Please bring all of your medications to every appointment so we can make sure that our medication list is the same as yours.   

## 2022-03-06 ENCOUNTER — Other Ambulatory Visit: Payer: Self-pay | Admitting: Family Medicine

## 2022-03-12 ENCOUNTER — Other Ambulatory Visit: Payer: Self-pay | Admitting: Family Medicine

## 2022-03-12 DIAGNOSIS — M5137 Other intervertebral disc degeneration, lumbosacral region: Secondary | ICD-10-CM

## 2022-03-12 MED ORDER — HYDROCODONE-ACETAMINOPHEN 5-325 MG PO TABS: 1.0000 | ORAL_TABLET | Freq: Four times a day (QID) | ORAL | 0 refills | Status: DC | PRN

## 2022-03-26 ENCOUNTER — Other Ambulatory Visit: Payer: Self-pay | Admitting: Family Medicine

## 2022-03-26 DIAGNOSIS — M5137 Other intervertebral disc degeneration, lumbosacral region: Secondary | ICD-10-CM

## 2022-03-27 ENCOUNTER — Other Ambulatory Visit: Payer: Self-pay | Admitting: Family Medicine

## 2022-03-27 DIAGNOSIS — M5137 Other intervertebral disc degeneration, lumbosacral region: Secondary | ICD-10-CM

## 2022-03-27 MED ORDER — HYDROCODONE-ACETAMINOPHEN 5-325 MG PO TABS: 1.0000 | ORAL_TABLET | Freq: Four times a day (QID) | ORAL | 0 refills | Status: DC | PRN

## 2022-04-04 ENCOUNTER — Other Ambulatory Visit: Payer: Self-pay | Admitting: Family Medicine

## 2022-04-04 DIAGNOSIS — F419 Anxiety disorder, unspecified: Secondary | ICD-10-CM

## 2022-04-09 ENCOUNTER — Other Ambulatory Visit: Payer: Self-pay | Admitting: Family Medicine

## 2022-04-09 DIAGNOSIS — M5137 Other intervertebral disc degeneration, lumbosacral region: Secondary | ICD-10-CM

## 2022-04-10 MED ORDER — HYDROCODONE-ACETAMINOPHEN 5-325 MG PO TABS: 1.0000 | ORAL_TABLET | Freq: Four times a day (QID) | ORAL | 0 refills | Status: DC | PRN

## 2022-04-18 DIAGNOSIS — I10 Essential (primary) hypertension: Secondary | ICD-10-CM | POA: Diagnosis not present

## 2022-04-18 DIAGNOSIS — I340 Nonrheumatic mitral (valve) insufficiency: Secondary | ICD-10-CM | POA: Diagnosis not present

## 2022-04-18 DIAGNOSIS — I350 Nonrheumatic aortic (valve) stenosis: Secondary | ICD-10-CM | POA: Diagnosis not present

## 2022-04-18 DIAGNOSIS — E785 Hyperlipidemia, unspecified: Secondary | ICD-10-CM | POA: Diagnosis not present

## 2022-04-18 DIAGNOSIS — I2510 Atherosclerotic heart disease of native coronary artery without angina pectoris: Secondary | ICD-10-CM | POA: Diagnosis not present

## 2022-04-18 DIAGNOSIS — I351 Nonrheumatic aortic (valve) insufficiency: Secondary | ICD-10-CM | POA: Diagnosis not present

## 2022-04-23 ENCOUNTER — Other Ambulatory Visit: Payer: Self-pay | Admitting: Family Medicine

## 2022-04-23 DIAGNOSIS — M5137 Other intervertebral disc degeneration, lumbosacral region: Secondary | ICD-10-CM

## 2022-04-23 MED ORDER — HYDROCODONE-ACETAMINOPHEN 5-325 MG PO TABS: 1.0000 | ORAL_TABLET | Freq: Four times a day (QID) | ORAL | 0 refills | Status: DC | PRN

## 2022-05-06 ENCOUNTER — Other Ambulatory Visit: Payer: Self-pay | Admitting: Family Medicine

## 2022-05-06 DIAGNOSIS — M51379 Other intervertebral disc degeneration, lumbosacral region without mention of lumbar back pain or lower extremity pain: Secondary | ICD-10-CM

## 2022-05-06 DIAGNOSIS — M5137 Other intervertebral disc degeneration, lumbosacral region: Secondary | ICD-10-CM

## 2022-05-06 MED ORDER — HYDROCODONE-ACETAMINOPHEN 5-325 MG PO TABS: 1.0000 | ORAL_TABLET | Freq: Four times a day (QID) | ORAL | 0 refills | Status: DC | PRN

## 2022-05-17 ENCOUNTER — Other Ambulatory Visit: Payer: Self-pay | Admitting: Family Medicine

## 2022-05-17 DIAGNOSIS — M5137 Other intervertebral disc degeneration, lumbosacral region: Secondary | ICD-10-CM

## 2022-05-18 ENCOUNTER — Other Ambulatory Visit: Payer: Self-pay | Admitting: Family Medicine

## 2022-05-18 DIAGNOSIS — M5137 Other intervertebral disc degeneration, lumbosacral region: Secondary | ICD-10-CM

## 2022-05-19 MED ORDER — HYDROCODONE-ACETAMINOPHEN 5-325 MG PO TABS: 1.0000 | ORAL_TABLET | Freq: Four times a day (QID) | ORAL | 0 refills | Status: DC | PRN

## 2022-05-27 ENCOUNTER — Other Ambulatory Visit: Payer: Self-pay | Admitting: Family Medicine

## 2022-05-27 DIAGNOSIS — F419 Anxiety disorder, unspecified: Secondary | ICD-10-CM

## 2022-05-27 NOTE — Telephone Encounter (Signed)
[facility] pharmacy faxed refill request for the following medications:  ALPRAZolam (XANAX) 0.5 MG tablet    Please advise  

## 2022-05-28 MED ORDER — ALPRAZOLAM 0.5 MG PO TABS: ORAL_TABLET | ORAL | 3 refills | Status: DC

## 2022-05-29 ENCOUNTER — Other Ambulatory Visit: Payer: Self-pay | Admitting: Family Medicine

## 2022-05-29 DIAGNOSIS — M5137 Other intervertebral disc degeneration, lumbosacral region: Secondary | ICD-10-CM

## 2022-05-31 MED ORDER — HYDROCODONE-ACETAMINOPHEN 5-325 MG PO TABS: 1.0000 | ORAL_TABLET | Freq: Four times a day (QID) | ORAL | 0 refills | Status: DC | PRN

## 2022-06-11 ENCOUNTER — Other Ambulatory Visit: Payer: Self-pay | Admitting: Family Medicine

## 2022-06-11 DIAGNOSIS — M5137 Other intervertebral disc degeneration, lumbosacral region: Secondary | ICD-10-CM

## 2022-06-13 MED ORDER — HYDROCODONE-ACETAMINOPHEN 5-325 MG PO TABS: 1.0000 | ORAL_TABLET | Freq: Four times a day (QID) | ORAL | 0 refills | Status: DC | PRN

## 2022-06-25 ENCOUNTER — Other Ambulatory Visit: Payer: Self-pay | Admitting: Family Medicine

## 2022-06-25 DIAGNOSIS — M5137 Other intervertebral disc degeneration, lumbosacral region: Secondary | ICD-10-CM

## 2022-06-26 MED ORDER — HYDROCODONE-ACETAMINOPHEN 5-325 MG PO TABS: 1.0000 | ORAL_TABLET | Freq: Four times a day (QID) | ORAL | 0 refills | Status: DC | PRN

## 2022-07-09 ENCOUNTER — Other Ambulatory Visit: Payer: Self-pay | Admitting: Family Medicine

## 2022-07-09 DIAGNOSIS — M5137 Other intervertebral disc degeneration, lumbosacral region: Secondary | ICD-10-CM

## 2022-07-09 MED ORDER — HYDROCODONE-ACETAMINOPHEN 5-325 MG PO TABS: 1.0000 | ORAL_TABLET | Freq: Four times a day (QID) | ORAL | 0 refills | Status: DC | PRN

## 2022-07-18 DIAGNOSIS — I340 Nonrheumatic mitral (valve) insufficiency: Secondary | ICD-10-CM | POA: Diagnosis not present

## 2022-07-18 DIAGNOSIS — I351 Nonrheumatic aortic (valve) insufficiency: Secondary | ICD-10-CM | POA: Diagnosis not present

## 2022-07-18 DIAGNOSIS — I350 Nonrheumatic aortic (valve) stenosis: Secondary | ICD-10-CM | POA: Diagnosis not present

## 2022-07-18 DIAGNOSIS — I2510 Atherosclerotic heart disease of native coronary artery without angina pectoris: Secondary | ICD-10-CM | POA: Diagnosis not present

## 2022-07-23 ENCOUNTER — Other Ambulatory Visit: Payer: Self-pay | Admitting: Family Medicine

## 2022-07-23 DIAGNOSIS — M5137 Other intervertebral disc degeneration, lumbosacral region: Secondary | ICD-10-CM

## 2022-07-23 MED ORDER — HYDROCODONE-ACETAMINOPHEN 5-325 MG PO TABS: 1.0000 | ORAL_TABLET | Freq: Four times a day (QID) | ORAL | 0 refills | Status: DC | PRN

## 2022-08-01 ENCOUNTER — Other Ambulatory Visit: Payer: Self-pay | Admitting: Family Medicine

## 2022-08-01 DIAGNOSIS — M5137 Other intervertebral disc degeneration, lumbosacral region: Secondary | ICD-10-CM

## 2022-08-04 NOTE — Telephone Encounter (Signed)
[email or web address]  

## 2022-08-05 ENCOUNTER — Other Ambulatory Visit: Payer: Self-pay | Admitting: Family Medicine

## 2022-08-05 DIAGNOSIS — M5137 Other intervertebral disc degeneration, lumbosacral region: Secondary | ICD-10-CM

## 2022-08-06 ENCOUNTER — Telehealth: Payer: Self-pay | Admitting: Family Medicine

## 2022-08-06 ENCOUNTER — Other Ambulatory Visit: Payer: Self-pay | Admitting: Family Medicine

## 2022-08-06 ENCOUNTER — Encounter: Payer: Self-pay | Admitting: Family Medicine

## 2022-08-06 DIAGNOSIS — M5137 Other intervertebral disc degeneration, lumbosacral region: Secondary | ICD-10-CM

## 2022-08-06 MED ORDER — HYDROCODONE-ACETAMINOPHEN 5-325 MG PO TABS: 1.0000 | ORAL_TABLET | Freq: Four times a day (QID) | ORAL | 0 refills | Status: DC | PRN

## 2022-08-06 NOTE — Telephone Encounter (Signed)
Pharmacy called about the RX Dr. Caryn Section sent over this morning for HYDROcodone-acetaminophen (NORCO/VICODIN) 5-325 MG tablet / their system corrupted the script and they need Dr. Caryn Section to re send the script again / please advise

## 2022-08-07 MED ORDER — HYDROCODONE-ACETAMINOPHEN 5-325 MG PO TABS: 1.0000 | ORAL_TABLET | Freq: Four times a day (QID) | ORAL | 0 refills | Status: DC | PRN

## 2022-08-19 ENCOUNTER — Other Ambulatory Visit: Payer: Self-pay | Admitting: Family Medicine

## 2022-08-19 DIAGNOSIS — M5137 Other intervertebral disc degeneration, lumbosacral region: Secondary | ICD-10-CM

## 2022-08-21 MED ORDER — HYDROCODONE-ACETAMINOPHEN 5-325 MG PO TABS: 1.0000 | ORAL_TABLET | Freq: Four times a day (QID) | ORAL | 0 refills | Status: DC | PRN

## 2022-08-25 ENCOUNTER — Other Ambulatory Visit: Payer: Self-pay | Admitting: Family Medicine

## 2022-09-02 ENCOUNTER — Telehealth: Payer: Self-pay | Admitting: Family Medicine

## 2022-09-02 NOTE — Telephone Encounter (Signed)
Received a fax from [employer]eds for alprazolam 0.90m  Key: [id number]

## 2022-09-03 ENCOUNTER — Other Ambulatory Visit: Payer: Self-pay | Admitting: Family Medicine

## 2022-09-03 DIAGNOSIS — F419 Anxiety disorder, unspecified: Secondary | ICD-10-CM

## 2022-09-03 DIAGNOSIS — M5137 Other intervertebral disc degeneration, lumbosacral region: Secondary | ICD-10-CM

## 2022-09-03 MED ORDER — ALPRAZOLAM 0.5 MG PO TABS: 0.5000 mg | ORAL_TABLET | Freq: Three times a day (TID) | ORAL | 3 refills | Status: DC | PRN

## 2022-09-04 MED ORDER — HYDROCODONE-ACETAMINOPHEN 5-325 MG PO TABS: 1.0000 | ORAL_TABLET | Freq: Four times a day (QID) | ORAL | 0 refills | Status: DC | PRN

## 2022-09-13 ENCOUNTER — Other Ambulatory Visit: Payer: Self-pay

## 2022-09-13 ENCOUNTER — Other Ambulatory Visit: Payer: Self-pay | Admitting: Cardiovascular Disease

## 2022-09-16 ENCOUNTER — Other Ambulatory Visit: Payer: Self-pay | Admitting: Family Medicine

## 2022-09-16 DIAGNOSIS — M5137 Other intervertebral disc degeneration, lumbosacral region: Secondary | ICD-10-CM

## 2022-09-16 MED ORDER — HYDROCODONE-ACETAMINOPHEN 5-325 MG PO TABS: 1.0000 | ORAL_TABLET | Freq: Four times a day (QID) | ORAL | 0 refills | Status: DC | PRN

## 2022-09-22 ENCOUNTER — Other Ambulatory Visit: Payer: Self-pay | Admitting: Cardiovascular Disease

## 2022-09-22 DIAGNOSIS — I10 Essential (primary) hypertension: Secondary | ICD-10-CM

## 2022-09-27 ENCOUNTER — Other Ambulatory Visit: Payer: Self-pay | Admitting: Family Medicine

## 2022-09-27 DIAGNOSIS — M5137 Other intervertebral disc degeneration, lumbosacral region: Secondary | ICD-10-CM

## 2022-09-27 MED ORDER — HYDROCODONE-ACETAMINOPHEN 5-325 MG PO TABS: 1.0000 | ORAL_TABLET | Freq: Four times a day (QID) | ORAL | 0 refills | Status: DC | PRN

## 2022-10-10 ENCOUNTER — Other Ambulatory Visit: Payer: Self-pay | Admitting: Family Medicine

## 2022-10-10 DIAGNOSIS — M5137 Other intervertebral disc degeneration, lumbosacral region: Secondary | ICD-10-CM

## 2022-10-10 DIAGNOSIS — M51379 Other intervertebral disc degeneration, lumbosacral region without mention of lumbar back pain or lower extremity pain: Secondary | ICD-10-CM

## 2022-10-11 MED ORDER — HYDROCODONE-ACETAMINOPHEN 5-325 MG PO TABS: 1.0000 | ORAL_TABLET | Freq: Four times a day (QID) | ORAL | 0 refills | Status: DC | PRN

## 2022-10-14 ENCOUNTER — Other Ambulatory Visit: Payer: Self-pay | Admitting: Cardiovascular Disease

## 2022-10-24 ENCOUNTER — Other Ambulatory Visit: Payer: Self-pay | Admitting: Family Medicine

## 2022-10-24 DIAGNOSIS — M5137 Other intervertebral disc degeneration, lumbosacral region: Secondary | ICD-10-CM

## 2022-10-25 ENCOUNTER — Other Ambulatory Visit: Payer: Self-pay | Admitting: Family Medicine

## 2022-10-25 DIAGNOSIS — M5137 Other intervertebral disc degeneration, lumbosacral region: Secondary | ICD-10-CM

## 2022-10-26 MED ORDER — HYDROCODONE-ACETAMINOPHEN 5-325 MG PO TABS
1.0000 | ORAL_TABLET | Freq: Four times a day (QID) | ORAL | 0 refills | Status: DC | PRN
Start: 2022-10-26 — End: 2022-11-09

## 2022-11-03 DIAGNOSIS — Q231 Congenital insufficiency of aortic valve: Secondary | ICD-10-CM | POA: Diagnosis not present

## 2022-11-03 DIAGNOSIS — D500 Iron deficiency anemia secondary to blood loss (chronic): Secondary | ICD-10-CM | POA: Diagnosis not present

## 2022-11-03 DIAGNOSIS — Z952 Presence of prosthetic heart valve: Secondary | ICD-10-CM | POA: Diagnosis not present

## 2022-11-03 DIAGNOSIS — Z7901 Long term (current) use of anticoagulants: Secondary | ICD-10-CM | POA: Diagnosis not present

## 2022-11-03 DIAGNOSIS — D696 Thrombocytopenia, unspecified: Secondary | ICD-10-CM | POA: Diagnosis not present

## 2022-11-03 DIAGNOSIS — I10 Essential (primary) hypertension: Secondary | ICD-10-CM | POA: Diagnosis not present

## 2022-11-03 DIAGNOSIS — S0990XA Unspecified injury of head, initial encounter: Secondary | ICD-10-CM | POA: Diagnosis not present

## 2022-11-03 DIAGNOSIS — R791 Abnormal coagulation profile: Secondary | ICD-10-CM | POA: Diagnosis not present

## 2022-11-03 DIAGNOSIS — I959 Hypotension, unspecified: Secondary | ICD-10-CM | POA: Diagnosis not present

## 2022-11-03 DIAGNOSIS — Z1152 Encounter for screening for COVID-19: Secondary | ICD-10-CM | POA: Diagnosis not present

## 2022-11-03 DIAGNOSIS — E871 Hypo-osmolality and hyponatremia: Secondary | ICD-10-CM | POA: Diagnosis not present

## 2022-11-03 DIAGNOSIS — S20219A Contusion of unspecified front wall of thorax, initial encounter: Secondary | ICD-10-CM | POA: Diagnosis not present

## 2022-11-03 DIAGNOSIS — S61411A Laceration without foreign body of right hand, initial encounter: Secondary | ICD-10-CM | POA: Diagnosis not present

## 2022-11-03 DIAGNOSIS — D6832 Hemorrhagic disorder due to extrinsic circulating anticoagulants: Secondary | ICD-10-CM | POA: Diagnosis not present

## 2022-11-03 DIAGNOSIS — G4700 Insomnia, unspecified: Secondary | ICD-10-CM | POA: Diagnosis not present

## 2022-11-03 DIAGNOSIS — S6991XA Unspecified injury of right wrist, hand and finger(s), initial encounter: Secondary | ICD-10-CM | POA: Diagnosis not present

## 2022-11-03 DIAGNOSIS — R0689 Other abnormalities of breathing: Secondary | ICD-10-CM | POA: Diagnosis not present

## 2022-11-03 DIAGNOSIS — R58 Hemorrhage, not elsewhere classified: Secondary | ICD-10-CM | POA: Diagnosis not present

## 2022-11-03 DIAGNOSIS — S61419A Laceration without foreign body of unspecified hand, initial encounter: Secondary | ICD-10-CM | POA: Diagnosis not present

## 2022-11-03 DIAGNOSIS — R000 Tachycardia, unspecified: Secondary | ICD-10-CM | POA: Diagnosis not present

## 2022-11-03 DIAGNOSIS — S2020XA Contusion of thorax, unspecified, initial encounter: Secondary | ICD-10-CM | POA: Diagnosis not present

## 2022-11-03 DIAGNOSIS — D62 Acute posthemorrhagic anemia: Secondary | ICD-10-CM | POA: Diagnosis not present

## 2022-11-03 DIAGNOSIS — G8929 Other chronic pain: Secondary | ICD-10-CM | POA: Diagnosis not present

## 2022-11-03 DIAGNOSIS — R42 Dizziness and giddiness: Secondary | ICD-10-CM | POA: Diagnosis not present

## 2022-11-03 DIAGNOSIS — D649 Anemia, unspecified: Secondary | ICD-10-CM | POA: Diagnosis not present

## 2022-11-03 DIAGNOSIS — S2231XA Fracture of one rib, right side, initial encounter for closed fracture: Secondary | ICD-10-CM | POA: Diagnosis not present

## 2022-11-03 DIAGNOSIS — M549 Dorsalgia, unspecified: Secondary | ICD-10-CM | POA: Diagnosis not present

## 2022-11-04 ENCOUNTER — Telehealth: Payer: Self-pay | Admitting: Cardiovascular Disease

## 2022-11-04 NOTE — Telephone Encounter (Signed)
Isaac Hancock, hospitalist at [facility], called and wanted to let us know that this patient is being admitted with an INR of 14. He recently fell and was taken to the ED where they found bleeding and checked an INR and seen it was extremely high. Called wanting his past INR's and to let us know what is going on. Just FYI.

## 2022-11-08 ENCOUNTER — Other Ambulatory Visit: Payer: Self-pay | Admitting: Family Medicine

## 2022-11-08 DIAGNOSIS — M5137 Other intervertebral disc degeneration, lumbosacral region: Secondary | ICD-10-CM

## 2022-11-09 ENCOUNTER — Other Ambulatory Visit: Payer: Self-pay | Admitting: Family Medicine

## 2022-11-09 DIAGNOSIS — M5137 Other intervertebral disc degeneration, lumbosacral region: Secondary | ICD-10-CM

## 2022-11-10 ENCOUNTER — Other Ambulatory Visit: Payer: Self-pay | Admitting: Cardiovascular Disease

## 2022-11-10 MED ORDER — HYDROCODONE-ACETAMINOPHEN 5-325 MG PO TABS
1.0000 | ORAL_TABLET | Freq: Four times a day (QID) | ORAL | 0 refills | Status: DC | PRN
Start: 2022-11-10 — End: 2022-11-20

## 2022-11-11 ENCOUNTER — Other Ambulatory Visit: Payer: Self-pay | Admitting: Cardiovascular Disease

## 2022-11-11 ENCOUNTER — Other Ambulatory Visit: Payer: BC Managed Care – PPO

## 2022-11-11 ENCOUNTER — Telehealth: Payer: Self-pay | Admitting: *Deleted

## 2022-11-11 DIAGNOSIS — I2510 Atherosclerotic heart disease of native coronary artery without angina pectoris: Secondary | ICD-10-CM | POA: Diagnosis not present

## 2022-11-11 NOTE — Transitions of Care (Post Inpatient/ED Visit) (Signed)
   11/11/2022  Name: Isaac Hancock MRN: [id number] DOB: 17-Sep-1955  Today's TOC FU Call Status: Today's TOC FU Call Status:: Unsuccessul Call (1st Attempt) Unsuccessful Call (1st Attempt) Date: 11/11/22  Attempted to reach the patient regarding the most recent Inpatient/ED visit.  Follow Up Plan: Additional outreach attempts will be made to reach the patient to complete the Transitions of Care (Post Inpatient/ED visit) call.   Gean Maidens BSN RN [facility] [phone]

## 2022-11-12 ENCOUNTER — Telehealth: Payer: Self-pay | Admitting: *Deleted

## 2022-11-12 LAB — PROTIME-INR
INR: 4.5 — ABNORMAL HIGH (ref 0.9–1.2)
Prothrombin Time: 43.4 s — ABNORMAL HIGH (ref 9.1–12.0)

## 2022-11-12 NOTE — Transitions of Care (Post Inpatient/ED Visit) (Signed)
   11/12/2022  Name: Isaac Hancock MRN: [id number] DOB: [birth year]  Today's TOC FU Call Status: Today's TOC FU Call Status:: Successful TOC FU Call Competed TOC FU Call Complete Date: 11/12/22  Transition Care Management Follow-up Telephone Call Date of Discharge: 11/08/22 Discharge Facility: Other ([facility]) Name of Other ([facility]) Discharge Facility: [facility] Type of Discharge: Inpatient Admission Primary Inpatient Discharge Diagnosis:: Supratherapeutic INR How have you been since you were released from the hospital?: Better Any questions or concerns?: No  Items Reviewed: Did you receive and understand the discharge instructions provided?: Yes Medications obtained and verified?: Yes (Medications Reviewed) Any new allergies since your discharge?: Yes Dietary orders reviewed?: No Do you have support at home?: No People in Home: alone  Home Care and Equipment/Supplies: Were Home Health Services Ordered?: NA Any new equipment or medical supplies ordered?: NA  Functional Questionnaire: Do you need assistance with bathing/showering or dressing?: No Do you need assistance with meal preparation?: No Do you need assistance with eating?: No Do you have difficulty maintaining continence: No Do you need assistance with getting out of bed/getting out of a chair/moving?: No  Follow up appointments reviewed: PCP Follow-up appointment confirmed?: Yes Date of PCP follow-up appointment?: 11/11/22 Follow-up Provider: [facility] Follow-up appointment confirmed?: Yes Date of Specialist follow-up appointment?: 11/20/22 Follow-Up Specialty Provider:: Dr Gus Puma Do you need transportation to your follow-up appointment?: No Do you understand care options if your condition(s) worsen?: Yes-patient verbalized understanding  SDOH Interventions Today    Flowsheet Row Most Recent Value  SDOH Interventions   Food Insecurity Interventions Intervention Not Indicated  Housing  Interventions Intervention Not Indicated  Transportation Interventions Intervention Not Indicated      Interventions Today    Flowsheet Row Most Recent Value  General Interventions   General Interventions Discussed/Reviewed General Interventions Discussed, General Interventions Reviewed, Doctor Visits  Doctor Visits Discussed/Reviewed Doctor Visits Discussed, Doctor Visits Reviewed      [facility] Interventions Today    Flowsheet Row Most Recent Value  TOC Interventions   TOC Interventions Discussed/Reviewed TOC Interventions Reviewed, TOC Interventions Discussed        Gean Maidens BSN RN [facility] [phone]

## 2022-11-13 DIAGNOSIS — S6991XA Unspecified injury of right wrist, hand and finger(s), initial encounter: Secondary | ICD-10-CM | POA: Diagnosis not present

## 2022-11-14 ENCOUNTER — Other Ambulatory Visit: Payer: Self-pay | Admitting: Cardiovascular Disease

## 2022-11-14 ENCOUNTER — Other Ambulatory Visit: Payer: BC Managed Care – PPO

## 2022-11-14 DIAGNOSIS — I2510 Atherosclerotic heart disease of native coronary artery without angina pectoris: Secondary | ICD-10-CM | POA: Diagnosis not present

## 2022-11-15 ENCOUNTER — Other Ambulatory Visit: Payer: Self-pay | Admitting: Cardiovascular Disease

## 2022-11-15 LAB — PROTIME-INR
INR: 1.9 — ABNORMAL HIGH (ref 0.9–1.2)
Prothrombin Time: 20.1 s — ABNORMAL HIGH (ref 9.1–12.0)

## 2022-11-15 MED ORDER — WARFARIN SODIUM 2 MG PO TABS: 2.0000 mg | ORAL_TABLET | Freq: Every day | ORAL | 11 refills | Status: DC

## 2022-11-20 ENCOUNTER — Other Ambulatory Visit: Payer: Self-pay | Admitting: Family Medicine

## 2022-11-20 DIAGNOSIS — M5137 Other intervertebral disc degeneration, lumbosacral region: Secondary | ICD-10-CM

## 2022-11-21 ENCOUNTER — Other Ambulatory Visit: Payer: BC Managed Care – PPO

## 2022-11-21 ENCOUNTER — Other Ambulatory Visit: Payer: Self-pay | Admitting: Cardiovascular Disease

## 2022-11-21 DIAGNOSIS — I2510 Atherosclerotic heart disease of native coronary artery without angina pectoris: Secondary | ICD-10-CM | POA: Diagnosis not present

## 2022-11-21 MED ORDER — HYDROCODONE-ACETAMINOPHEN 5-325 MG PO TABS
1.0000 | ORAL_TABLET | Freq: Four times a day (QID) | ORAL | 0 refills | Status: DC | PRN
Start: 2022-11-21 — End: 2022-12-03

## 2022-11-22 LAB — PROTIME-INR
INR: 1.3 — ABNORMAL HIGH (ref 0.9–1.2)
Prothrombin Time: 13.5 s — ABNORMAL HIGH (ref 9.1–12.0)

## 2022-12-03 ENCOUNTER — Other Ambulatory Visit: Payer: Self-pay | Admitting: Family Medicine

## 2022-12-03 DIAGNOSIS — M5137 Other intervertebral disc degeneration, lumbosacral region: Secondary | ICD-10-CM

## 2022-12-04 ENCOUNTER — Encounter: Payer: Self-pay | Admitting: Family Medicine

## 2022-12-04 MED ORDER — HYDROCODONE-ACETAMINOPHEN 5-325 MG PO TABS
1.0000 | ORAL_TABLET | Freq: Four times a day (QID) | ORAL | 0 refills | Status: DC | PRN
Start: 2022-12-04 — End: 2022-12-11

## 2022-12-08 ENCOUNTER — Other Ambulatory Visit: Payer: Self-pay | Admitting: Family Medicine

## 2022-12-11 ENCOUNTER — Other Ambulatory Visit: Payer: Self-pay | Admitting: Family Medicine

## 2022-12-11 DIAGNOSIS — M5137 Other intervertebral disc degeneration, lumbosacral region: Secondary | ICD-10-CM

## 2022-12-12 ENCOUNTER — Encounter: Payer: Self-pay | Admitting: Family Medicine

## 2022-12-13 MED ORDER — HYDROCODONE-ACETAMINOPHEN 5-325 MG PO TABS
1.0000 | ORAL_TABLET | Freq: Four times a day (QID) | ORAL | 0 refills | Status: DC | PRN
Start: 2022-12-13 — End: 2022-12-18

## 2022-12-16 ENCOUNTER — Ambulatory Visit: Payer: BC Managed Care – PPO | Admitting: Cardiovascular Disease

## 2022-12-16 ENCOUNTER — Other Ambulatory Visit: Payer: Self-pay | Admitting: Cardiovascular Disease

## 2022-12-16 ENCOUNTER — Encounter: Payer: Self-pay | Admitting: Cardiovascular Disease

## 2022-12-16 VITALS — BP 130/58 | HR 65 | Ht 72.0 in | Wt 205.0 lb

## 2022-12-16 DIAGNOSIS — G4733 Obstructive sleep apnea (adult) (pediatric): Secondary | ICD-10-CM | POA: Diagnosis not present

## 2022-12-16 DIAGNOSIS — K2100 Gastro-esophageal reflux disease with esophagitis, without bleeding: Secondary | ICD-10-CM | POA: Diagnosis not present

## 2022-12-16 DIAGNOSIS — E782 Mixed hyperlipidemia: Secondary | ICD-10-CM

## 2022-12-16 DIAGNOSIS — Z952 Presence of prosthetic heart valve: Secondary | ICD-10-CM

## 2022-12-16 DIAGNOSIS — I2510 Atherosclerotic heart disease of native coronary artery without angina pectoris: Secondary | ICD-10-CM | POA: Diagnosis not present

## 2022-12-16 DIAGNOSIS — I10 Essential (primary) hypertension: Secondary | ICD-10-CM

## 2022-12-16 NOTE — Progress Notes (Signed)
Cardiology Office Note   Date:  12/16/2022   ID:  ROLANDO Hancock, DOB 03/24/1956, MRN [id number]  PCP:  Malva Limes, MD  Cardiologist:  Adrian Blackwater, MD      History of Present Illness: Isaac Hancock is a 67 y.o. male who presents for  Chief Complaint  Patient presents with   Follow-up    4 Months Follow Up    Doing well. Taking from 2 to 3 mg coumadin and las INE 11/21/22 was lw 1.3      Past Medical History:  Diagnosis Date   History of chicken pox    History of measles    History of mumps      Past Surgical History:  Procedure Laterality Date   AORTIC VALVE REPLACEMENT  08/2009   Mechanical; [facility]. [facility]. discharged on Coumadin and Toprol XL   CARDIAC CATHETERIZATION  08/2009   Normal Coronary Arteries, Normal EF; Severe aortic stenosis   COLONOSCOPY  12/2010   Normal; symptoms: OC Light +. Iftikhar   DOPPLER ECHOCARDIOGRAPHY  02/18/2012   normal EF. Done by Dr. 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     HISTORY OF PRESENT ILLNESS  Isaac Hancock is a 67 year old male.   Patient in office for routine cardiac exam. No cardiac complaints.  Allergy list reviewed   Medication list reviewed    No chest pain or discomfort   No palpitations  , and The heart rate was not fast    Not feeling congested in the chest   No dyspnea   No cough   No wheezing     CURRENT MEDICATION    Allopurinol 100 MG Tablet  0 days, 0 refills    amLODIPine Besylate 10 MG Oral Tablet  TAKE 1 TABLET DAILY, 90 days, 1 refills    Centrum Silver Oral Tablet once a day 0 days, 0 refills    C-pap Supplies Miscellaneous (not specified) use as directed; Please dispense all needed c-pap supplies. Dx: OSA, 99 days, 0 refills    Enalapril Maleate 20 MG Oral Tablet TAKE 1 TABLET TWICE A DAY, 90 days, 0 refills    Lipitor 80 MG Oral Tablet  0 days, 0 refills    Metoprolol Succinate ER 200 MG Tablet Extended Release 24 Hour once a day faxed, 30 days, 0 refills    Omeprazole 20MG  Oral Capsule Delayed Release 20 MG once a day 0 days, 0 refills    Potassium Chloride ER 20 MEQ Oral Tablet Extended Release Take daily for 2 weeks, then every other day after that., 30 days, 0 refills    Vitamin D2 1.25mg  50,000 Oral Tablet 50,000 once a day 0 days, 0 refills    Warfarin Sodium 1 MG Oral Tablet 1 tablet by mouth daily as directed for INR reading, 90 days, 0 refills    Warfarin Sodium 4 MG  Oral Tablet TAKE 1 TABLET DAILY, 90 days, 3 refills    Warfarin Sodium 6 MG Oral Tablet TAKE 1 TABLET BY MOUTH EVERY DAY AS DIRECTED, 30 days, 0 refills     PAST MEDICAL/SURGICAL HISTORY  Diagnoses:  Coronary artery disease 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   Hyperlipidemia.   Diabetes mellitus diet controlled AVR.  Gout    Surgeries;  Umbilical hernia repair2012.     SOCIAL HISTORY  Social history unchanged MODERATE AORTIC STENOSIS. Tobacco use: Not a current smoker and smoking status: Former smoker. Alcohol: Alcohol use. Habits: Poor exercise habits.     ALLERGIES    No Known Allergies     FAMILY HISTORY  Heart disease  Systemic hypertension     REVIEW OF SYSTEMS  Systemic: Not feeling poorly (malaise).  No recent weight change. Head: No headache and no sinus  pain. Cardiovascular: No chest pain or discomfort, no palpitations, and the heart rate was not fast. Pulmonary: No dyspnea, no cough, and no wheezing. Gastrointestinal: No heartburn.  No nausea, no vomiting, no abdominal pain, and no diarrhea. Neurological: No dizziness, no vertigo, and no fainting. Psychological: No anxiety and no depression.     PHYSICAL FINDINGS    Vitals taken 07/18/2022 10:30 am  BP-Sitting  122/80 mmHg 100 - 120/60 - 80  Pulse Rate-Sitting  60 bpm 50 - 100  Temp-Oral  97 F 96 - 101  Height  71 in 64 - 74  Weight  218 lbs 9.6 oz 123 - 215  Body Mass Index  30.5 kg/m2   Body Surface Area  2.2 m2   Oxygen Saturation  98 % 93 - 100    General Appearance:  In no acute distress. Lungs:  Clear to auscultation. Cardiovascular: Jugular Venous Distention:  JVD not increased.   JVD not increased. Heart Rate And Rhythm:  Normal.   Normal. Heart Sounds:  Normal. Murmurs:  No murmurs were heard. Carotid Arteries:  No bruit in the carotid artery. Arterial Pulses:  Equal bilaterally and normal. Edema:  Not present. Abdomen: Auscultation:  Bowel sounds were normal. Palpation:  Abdominal non-tender. Neurological:  Oriented to time, place, and person.     ASSESSMENT   Coronary artery disease  Aortic stenosis  Rheumatic aortic stenosis  Aortic regurgitation  Mitral regurgitation  Systemic hypertension  Hyperlipidemia     THERAPY   Continue current medication.  Clinical summary provided to patient.     COUNSELING/EDUCATION   Education and counseling diet and exercise     PLAN     AORTIC STENOSIS RHEUMATIC Care Plan Goal: PT/INR Other Health Concerns:  Aortic Stenosis Mechanical valve INR goal 2.5-3.5 Instructions:  Take medication(s) as prescribed: Pt is currently taking 5mg  daily as of 12/31/16. Consider increasing if INR stays low.  03/03/17: INR is 3.0. continue current dosing of 5mg  daily.  07/02/2017: INR 3.8: Hold dose 1 day.  Decrease dose to 4mg  MWF and 5mg  all other days. Recheck in 2 weeks.  08/04/2017: 3.0 Continue 4mg  MWF, and 5mg  all other days. INR at goal of 2.4, continue 4mg  MWF, and 5mg  all other days. INR 5.1, INR GOAL 2.5-3.5: INR 6/14 now at goal, 3.0. Advise warfarin 4mg  MWF and 5 mg all other days. Recheck in 2 weeks. INR GOAL 2.5-3.5, INR 6/28, 3.3. Continue 4mg  MWF  and 5mg  all other days. Recheck 1 mo. INR GOAL 2.5-3.5, INR 03/17/18, 2.9. Continue 4mg  MWF and 5mg  all other days. Recheck 1 mo. INR GOAL 2.5-3.5, INR 3.3. Continue 4mg  MWF and 5mg  all other days. Recheck 1 mo. INR 5.0, Hold warfarin for 1 day, Will have pt hold dose for 1 day, then take 2mg  tablet the next day, then resume normal dosing. INR Goal 2.5-3.5, INR level 3.9, take 1/2 of regular dose today, and resume regular dose schedule tomorrow, and recheck INR in 1 week. INR Goal 2.5-3.5: INR 3.2, at goal. Continue 4mg  MWF, 5mg  other days. Recheck 2 weeks. INR goal2.5-3.5: INR 3.7, slightly above goal. Continue 4mg /day, recheck 2 weeks. INR is 3.0, at goal. Continue 4mg /day and recheck 1 mo. INR sl low 2.1, increase to 5mg  on Mon and Thursday, 4mg  other days, repeat test in 2 weeks. INR 3.1, cont 5mg  M TH, 4mg  other days. INR jumped to 5.7. Hold dose for 2 days and restart at 5mg  M TH, 4mg  other days. Recheck 10 days. INR now at goal 3.4, continue 5mg  Monday and Thursday, 4mg  other days.  Recheck 2 weeks. INR 2.9, continue 5mg  Mon and thur, 4 mg other days recheck 1 mo.      Nonrheumatic aortic (valve) insufficiency Lab: Prothrombin Time (PT)     Return to the clinic if condition worsens or new symptoms arise  Follow-up visit Patieny doing well. No complaints.     INR today.    Return in 4 months.      Adrian Blackwater MD  Electronically signed by: Adrian Blackwater     Date: 07/18/2022 10:43  Current Outpatient Medications  Medication Sig Dispense Refill   ALPRAZolam (XANAX) 0.5 MG tablet Take 1 tablet (0.5 mg total) by mouth 3  (three) times daily as needed for anxiety. 60 tablet 3   atorvastatin (LIPITOR) 80 MG tablet TAKE 1 TABLET DAILY 90 tablet 3   colchicine 0.6 MG tablet TAKE 2 TABLETS ON FIRST DAY OF GOUT FLARE THEN TAKE 1 TABLET DAILY AS NEEDED. REDUCE ATORVASTATIN TO 1/2 TABLET DAILY ON DAYS YOU TAKE COLCHICINE. 20 tablet 5   cyclobenzaprine (FLEXERIL) 10 MG tablet Take 1 tablet (10 mg total) by mouth 3 (three) times daily as needed for muscle spasms. 30 tablet 0   enalapril (VASOTEC) 20 MG tablet TAKE 1 TABLET TWICE A DAY 180 tablet 1   fexofenadine (ALLEGRA) 60 MG tablet Take 60 mg by mouth 2 (two) times daily.      furosemide (LASIX) 20 MG tablet Take 20 mg by mouth daily.      HYDROcodone-acetaminophen (NORCO/VICODIN) 5-325 MG tablet Take 1-2 tablets by mouth every 6 (six) hours as needed for moderate pain. 30 tablet 0   metoprolol (TOPROL-XL) 200 MG 24 hr tablet TAKE 1 TABLET DAILY (NEED TO SCHEDULE OFFICE VISIT FOR FOLLOW UP) 90 tablet 4   MULTIPLE VITAMIN PO Take 1 tablet by mouth daily.      NON FORMULARY      omega-3 acid ethyl esters (LOVAZA) 1 g capsule TAKE 2 CAPSULES TWICE A DAY 360 capsule 4   omeprazole (PRILOSEC) 20 MG capsule TAKE 1 CAPSULE DAILY 90 capsule 3   Vitamin D, Ergocalciferol, (DRISDOL) 1.25 MG (50000 UNIT) CAPS capsule TAKE 1 CAPSULE ONCE WEEKLY 12 capsule 3   warfarin (COUMADIN) 2 MG tablet Take 1 tablet (2 mg total) by mouth daily. 30 tablet 11   warfarin (COUMADIN) 6 MG tablet TAKE 1 TABLET BY MOUTH EVERY DAY AS  DIRECTED 30 tablet 0   No current facility-administered medications for this visit.    Allergies:   Crestor [rosuvastatin calcium]    Social History:   reports that he has never smoked. He has quit using smokeless tobacco.  His smokeless tobacco use included snuff and chew. He reports current alcohol use. He reports that he does not use drugs.   Family History:  family history includes Hypertension in his mother.    ROS:     Review of Systems  Constitutional:  Negative.   HENT: Negative.    Eyes: Negative.   Respiratory: Negative.    Gastrointestinal: Negative.   Genitourinary: Negative.   Musculoskeletal: Negative.   Skin: Negative.   Neurological: Negative.   Endo/Heme/Allergies: Negative.   Psychiatric/Behavioral: Negative.    All other systems reviewed and are negative.     All other systems are reviewed and negative.    PHYSICAL EXAM: VS:  BP (!) 130/58   Pulse 65   Ht 6' (1.829 m)   Wt 205 lb (93 kg)   SpO2 96%   BMI 27.80 kg/m  , BMI Body mass index is 27.8 kg/m. Last weight:  Wt Readings from Last 3 Encounters:  12/16/22 205 lb (93 kg)  01/18/22 213 lb (96.6 kg)  09/05/21 211 lb (95.7 kg)     Physical Exam Vitals reviewed.  Constitutional:      Appearance: Normal appearance. He is normal weight.  HENT:     Head: Normocephalic.     Nose: Nose normal.     Mouth/Throat:     Mouth: Mucous membranes are moist.  Eyes:     Pupils: Pupils are equal, round, and reactive to light.  Cardiovascular:     Rate and Rhythm: Normal rate and regular rhythm.     Pulses: Normal pulses.     Heart sounds: Normal heart sounds.  Pulmonary:     Effort: Pulmonary effort is normal.  Abdominal:     General: Abdomen is flat. Bowel sounds are normal.  Musculoskeletal:        General: Normal range of motion.     Cervical back: Normal range of motion.  Skin:    General: Skin is warm.  Neurological:     General: No focal deficit present.     Mental Status: He is alert.  Psychiatric:        Mood and Affect: Mood normal.       EKG:   Recent Labs: 01/18/2022: BUN 18; Creatinine, Ser 1.18; Hemoglobin 12.5; Platelets 135; Potassium 4.4; Sodium 135    Lipid Panel    Component Value Date/Time   CHOL 208 (H) 09/05/2021 0916   CHOL 252 (H) 12/01/2013 1734   TRIG 114 09/05/2021 0916   TRIG 68 12/01/2013 1734   HDL 66 09/05/2021 0916   HDL 85 (H) 12/01/2013 1734   CHOLHDL 3.2 09/05/2021 0916   CHOLHDL 7.1 (H) 05/29/2017 0828    VLDL 14 12/01/2013 1734   LDLCALC 122 (H) 09/05/2021 0916   LDLCALC  05/29/2017 0828     Comment:     . LDL cholesterol not calculated. Triglyceride levels greater than 400 mg/dL invalidate calculated LDL results. . Reference range: <100 . Desirable range <100 mg/dL for primary prevention;   <70 mg/dL for patients with CHD or diabetic patients  with > or = 2 CHD risk factors. Marland Kitchen LDL-C is now calculated using the Martin-Hopkins  calculation, which is a validated novel method providing  better accuracy than the [employer]  equation in the  estimation of LDL-C.  Horald Pollen et al. Lenox Ahr. 1610;960(45): 2061-2068  (http://education.[facility].com/faq/FAQ164)    LDLCALC 153 (H) 12/01/2013 1734      TESTS    [facility] [address] [place], [place] [address] [phone] STUDY:  Rest / Gated Stress Myocardial Perfusion With Wall Motion, Left Ventricular Ejection Fraction SEX:     male                                                                                                                                                                                                                   REFERRING PHYSICIAN:    Smith                                                                                                                                                                                                                   INDICATION FOR STUDY:    CP  TECHNIQUE:  Approximately 45 minutes following the intravenous administration of 12.1    mCi of Tc-73m Sestamibi with the patient at rest, SPECT imaging of the heart was performed.  The patient then underwent stress testing.  At peak stress, the patient was injected intravenously  with  31.9   mCi of Tc-9m  Sestamibi.  Approximately 45 minutes later, gated SPECT imaging of the heart was performed.  STRESS BY:  Adrian Blackwater, MD PROTOCOL:    Smitty Cords                                                                                     MAX PRED HR:     169                 85%:   144             75%:    127                                                                                                                RESTING BP:  128/64         RESTING HR:     88    PEAK BP:    136/72        PEAK HR:     147                                                                EXERCISE DURATION:      5:44                                        METS:    7.1  REASON FOR TEST TERMINATION:   Target HR achieved  SYMPTOMS:    none [facility] TREADMILL SCORE:       0                                 RISK:     moderate                                                                                                                                                                                                       EKG RESULTS:      NSR.  1mm of downsloping ST depression in the inferior and inferolateral leads with exercise.  PERFUSION/WALL MOTION FINDINGS:    Small inferolateral wall defect which is reversible.  Gated:  normal LV size, wall motion and systolic function.  EF>70%.                                                                       IMPRESSION:    Abnormal study.  There is evidence of small ischemia in inferolateral wall.  Normal LV  systolic function.  Positive ECG changes with exercise and moderate risk DTS.                                                                                                                                                                                                                                                                                     Retta Diones, MD  Stress Interpreting Physician / Nuclear Interpreting Physician                       Adrian Blackwater, MD  Electronically signed by: Adrian Blackwater     Date: 08/10/2009 12:47 REASON FOR VISIT  Visit for: Echocardiogram/Prosthetic vHeart valve  Sex:   Male        wt=  218  lbs.  BP=126/66  Height=  71  inches.        TESTS  Imaging: Echocardiogram:  An echocardiogram in (2-d) mode was performed and in Doppler mode with color flow velocity mapping was performed. The aortic valve cusps are abnormal, flow velocity 2.6  m/s, and systolic calculated mean flow gradient 13  mmHg. Mitral valve diastolic peak flow velocity E 1.7   m/s and E/A ratio. Aortic root diameter 3.4  cm. The LVOT internal diameter 2.3 cm and flow velocity was abnormal 1.3  m/s. LV systolic dimension 3.1  cm, diastolic 5  cm, posterior wall thickness 1.4  cm, fractional shortening 39 %, and EF 64  %. IVS thickness 1.1  cm. LA dimension 4.1 cm  RIGHT atrium=  30.4  cm2. Mitral Valve =  Ea=10.3  DT= 218 msec. Aortic valve replacement. Mitral Valve is Normal. Tricuspid Valve =  TR jet V=  2.2    RAP=5  RVSP= 25    mmHg. Pulmonic Valve= PIEDV=   .9   m/s. Aortic Valve has Trace Regurgitation. Pulmonic Valve has Mild Regurgitation. Tricuspid Valve has Mild Regurgitation.     ASSESSMENT  Technically adequate study.  Ejection fraction-64  Left Ventricle- Mild dilation, normal function  Left Ventricle diastolic dysfunction grade-Normal diastolic function  Right Ventricle- Mild dilation, normal function  No wall motion abnormalities  Left  Atrium-Moderate dilation  Right Atrium-Moderate dilation  Aortic valve- Aortic valve replacement with mild stenosis and trace regurgitation  Pulmonic Valve-No stenosis Mild regurgitation  Mitral Valve-No regurgitation  Tricuspid Valve-Mild Regurgitation  No Pericardial effusion.     THERAPY   Referring physician: Laurier Nancy  Sonographer: Lenor Derrick.      Adrian Blackwater MD  Electronically signed by: Adrian Blackwater     Date: 10/23/2020 08:50 Other studies Reviewed: Additional studies/ records that were reviewed today include:  Review of the above records demonstrates:       No data to display            ASSESSMENT AND PLAN:    ICD-10-CM   1. Primary hypertension  I10 PCV ECHOCARDIOGRAM COMPLETE    2. OSA on CPAP  G47.33 PCV ECHOCARDIOGRAM COMPLETE    3. Gastroesophageal reflux disease with esophagitis without hemorrhage  K21.00 PCV ECHOCARDIOGRAM COMPLETE    4. H/O aortic valve replacement  Z95.2 PCV ECHOCARDIOGRAM COMPLETE    5. Hyperlipidemia, mixed  E78.2 PCV ECHOCARDIOGRAM COMPLETE       Problem List Items Addressed This Visit       Cardiovascular and Mediastinum   Primary hypertension - Primary   Relevant Orders   PCV ECHOCARDIOGRAM COMPLETE     Respiratory   OSA on CPAP   Relevant Orders   PCV ECHOCARDIOGRAM COMPLETE     Digestive   GERD (gastroesophageal reflux disease)   Relevant Orders   PCV ECHOCARDIOGRAM COMPLETE     Other   Hyperlipidemia, mixed   Relevant Orders   PCV ECHOCARDIOGRAM COMPLETE   H/O aortic valve replacement    Will check INR today as was low 1.3 last time on coumadin 3 mg.Need INR 2.5      Relevant Orders   PCV ECHOCARDIOGRAM COMPLETE       Disposition:   Return in about 3 months (around 03/18/2023) for echo and f/u 3 month.    Total time spent: 30 minutes  Signed,  Adrian Blackwater, MD  12/16/2022 9:35 AM    [facility]

## 2022-12-16 NOTE — Assessment & Plan Note (Addendum)
Will check INR today as was low 1.3 last time on coumadin 3 mg.Need INR 2.5

## 2022-12-17 ENCOUNTER — Encounter: Payer: Self-pay | Admitting: Cardiovascular Disease

## 2022-12-17 LAB — PROTIME-INR
INR: 1.5 — ABNORMAL HIGH (ref 0.9–1.2)
Prothrombin Time: 15.8 s — ABNORMAL HIGH (ref 9.1–12.0)

## 2022-12-18 ENCOUNTER — Other Ambulatory Visit: Payer: Self-pay | Admitting: Family Medicine

## 2022-12-18 DIAGNOSIS — M5137 Other intervertebral disc degeneration, lumbosacral region: Secondary | ICD-10-CM

## 2022-12-20 ENCOUNTER — Ambulatory Visit: Payer: BC Managed Care – PPO | Admitting: Family Medicine

## 2022-12-20 VITALS — BP 121/66 | HR 63 | Ht 72.0 in | Wt 209.2 lb

## 2022-12-20 DIAGNOSIS — Z952 Presence of prosthetic heart valve: Secondary | ICD-10-CM

## 2022-12-20 DIAGNOSIS — I10 Essential (primary) hypertension: Secondary | ICD-10-CM | POA: Diagnosis not present

## 2022-12-20 DIAGNOSIS — F419 Anxiety disorder, unspecified: Secondary | ICD-10-CM

## 2022-12-20 DIAGNOSIS — E559 Vitamin D deficiency, unspecified: Secondary | ICD-10-CM

## 2022-12-20 DIAGNOSIS — Z23 Encounter for immunization: Secondary | ICD-10-CM | POA: Diagnosis not present

## 2022-12-20 DIAGNOSIS — K2100 Gastro-esophageal reflux disease with esophagitis, without bleeding: Secondary | ICD-10-CM

## 2022-12-20 DIAGNOSIS — M5137 Other intervertebral disc degeneration, lumbosacral region: Secondary | ICD-10-CM

## 2022-12-20 DIAGNOSIS — M419 Scoliosis, unspecified: Secondary | ICD-10-CM

## 2022-12-20 DIAGNOSIS — F1190 Opioid use, unspecified, uncomplicated: Secondary | ICD-10-CM

## 2022-12-20 DIAGNOSIS — E782 Mixed hyperlipidemia: Secondary | ICD-10-CM | POA: Diagnosis not present

## 2022-12-20 DIAGNOSIS — Z125 Encounter for screening for malignant neoplasm of prostate: Secondary | ICD-10-CM | POA: Diagnosis not present

## 2022-12-20 DIAGNOSIS — M545 Low back pain, unspecified: Secondary | ICD-10-CM

## 2022-12-20 MED ORDER — HYDROCODONE-ACETAMINOPHEN 5-325 MG PO TABS
1.0000 | ORAL_TABLET | Freq: Four times a day (QID) | ORAL | 0 refills | Status: DC | PRN
Start: 2022-12-20 — End: 2022-12-27

## 2022-12-20 NOTE — Progress Notes (Signed)
I,Sha'taria Tyson,acting as a Neurosurgeon for Mila Merry, MD.,have documented all relevant documentation on the behalf of Mila Merry, MD,as directed by  Mila Merry, MD  Established patient visit   Patient: Isaac Hancock   DOB: 02-02-1956   67 y.o. Male  MRN: [id number] Visit Date: 12/20/2022  Today's healthcare provider: Mila Merry, MD    Subjective    HPI  Hypertension, follow-up  BP Readings from Last 3 Encounters:  12/16/22 (!) 130/58  01/18/22 140/82  09/05/21 (!) 167/79   Wt Readings from Last 3 Encounters:  12/16/22 205 lb (93 kg)  01/18/22 213 lb (96.6 kg)  09/05/21 211 lb (95.7 kg)      Outside blood pressures are not being checked. Symptoms: No chest pain No chest pressure  No palpitations No syncope  No dyspnea No orthopnea  No paroxysmal nocturnal dyspnea No lower extremity edema   Pertinent labs Lab Results  Component Value Date   CHOL 208 (H) 09/05/2021   HDL 66 09/05/2021   LDLCALC 122 (H) 09/05/2021   TRIG 114 09/05/2021   CHOLHDL 3.2 09/05/2021   Lab Results  Component Value Date   NA 135 01/18/2022   K 4.4 01/18/2022   CREATININE 1.18 01/18/2022   EGFR 69 01/18/2022   GLUCOSE 116 (H) 01/18/2022   TSH 1.520 11/03/2020     The 10-year ASCVD risk score (Arnett DK, et al., 2019) is: 13.3% ---------------------------------------------------------------------------------------------------  Lipid/Cholesterol, Follow-up  Last lipid panel Other pertinent labs  Lab Results  Component Value Date   CHOL 208 (H) 09/05/2021   HDL 66 09/05/2021   LDLCALC 122 (H) 09/05/2021   TRIG 114 09/05/2021   CHOLHDL 3.2 09/05/2021   Lab Results  Component Value Date   ALT 18 09/05/2021   AST 34 09/05/2021   PLT 135 (L) 01/18/2022   TSH 1.520 11/03/2020       Symptoms: No chest pain No chest pressure/discomfort  No dyspnea No lower extremity edema  No numbness or tingling of extremity No orthopnea  No palpitations No paroxysmal nocturnal  dyspnea  No speech difficulty No syncope   The 10-year ASCVD risk score (Arnett DK, et al., 2019) is: 13.3%  ---------------------------------------------------------------------------------------------------   He continues to see Dr. Welton Flakes for follow up aortic valve replacement. He continues to have chronic back pain secondary to DDD and scoliosis and reports current dose of hydrocodone/apap continues to work well.   Has prescription for alprazolam which he mainly takes to help him sleep at night a few times a week.   He has gout flares every 3-4 months and reports that colchicine continues to be very effective.  Medications: Outpatient Medications Prior to Visit  Medication Sig   ALPRAZolam (XANAX) 0.5 MG tablet Take 1 tablet (0.5 mg total) by mouth 3 (three) times daily as needed for anxiety.   atorvastatin (LIPITOR) 80 MG tablet TAKE 1 TABLET DAILY   colchicine 0.6 MG tablet TAKE 2 TABLETS ON FIRST DAY OF GOUT FLARE THEN TAKE 1 TABLET DAILY AS NEEDED. REDUCE ATORVASTATIN TO 1/2 TABLET DAILY ON DAYS YOU TAKE COLCHICINE.   cyclobenzaprine (FLEXERIL) 10 MG tablet Take 1 tablet (10 mg total) by mouth 3 (three) times daily as needed for muscle spasms.   enalapril (VASOTEC) 20 MG tablet TAKE 1 TABLET TWICE A DAY   fexofenadine (ALLEGRA) 60 MG tablet Take 60 mg by mouth 2 (two) times daily.    furosemide (LASIX) 20 MG tablet Take 20 mg by mouth daily.  HYDROcodone-acetaminophen (NORCO/VICODIN) 5-325 MG tablet Take 1-2 tablets by mouth every 6 (six) hours as needed for moderate pain.   metoprolol (TOPROL-XL) 200 MG 24 hr tablet TAKE 1 TABLET DAILY (NEED TO SCHEDULE OFFICE VISIT FOR FOLLOW UP)   MULTIPLE VITAMIN PO Take 1 tablet by mouth daily.    NON FORMULARY    omega-3 acid ethyl esters (LOVAZA) 1 g capsule TAKE 2 CAPSULES TWICE A DAY   omeprazole (PRILOSEC) 20 MG capsule TAKE 1 CAPSULE DAILY   Vitamin D, Ergocalciferol, (DRISDOL) 1.25 MG (50000 UNIT) CAPS capsule TAKE 1 CAPSULE ONCE  WEEKLY   warfarin (COUMADIN) 2 MG tablet Take 1 tablet (2 mg total) by mouth daily.   warfarin (COUMADIN) 6 MG tablet TAKE 1 TABLET BY MOUTH EVERY DAY AS DIRECTED   No facility-administered medications prior to visit.       Objective    BP 121/66   Pulse 63   Ht 6' (1.829 m)   Wt 209 lb 3.2 oz (94.9 kg)   SpO2 99%   BMI 28.37 kg/m    Physical Exam   General: Appearance:    Well developed, well nourished male in no acute distress  Eyes:    PERRL, conjunctiva/corneas clear, EOM's intact       Lungs:     Clear to auscultation bilaterally, respirations unlabored  Heart:    Normal heart rate. Normal rhythm. No murmurs, rubs, or gallops.   MS:   All extremities are intact.    Neurologic:   Awake, alert, oriented x 3. No apparent focal neurological defect.         Assessment & Plan     1. Primary hypertension Well controlled.  Continue current medications.   - CBC - Comprehensive metabolic panel - TSH  2. Hyperlipidemia, mixed He is tolerating atorvastatin well with no adverse effects.  Will change Lovaza to Vascepa with next refill in August due to favorably CV profile.  - Lipid panel - TSH  3. Vitamin D deficiency  - VITAMIN D 25 Hydroxy (Vit-D Deficiency, Fractures)  4. Prostate cancer screening  - PSA Total (Reflex To Free) ([facility] only)  5. Scoliosis, unspecified scoliosis type, unspecified spinal region   6. Gastroesophageal reflux disease with esophagitis without hemorrhage   7. Anxiety Doing well with alprazolam which he reports taking 3-4 nights a week to help relax enough to get to sleep.   8. DDD (degenerative disc disease), lumbosacral  9. Chronic midline low back pain, unspecified whether sciatica present   10. Chronic, continuous use of opioids Pain adequately controlled with no signs of abuse or diversion.  Urine collected for drug screen today.    11. H/O aortic valve replacement PT/INR monitored by Dr. Welton Flakes.   12. Need for  vaccination against Streptococcus pneumoniae  - Pneumococcal conjugate vaccine 20-valent (PCV20)      The entirety of the information documented in the History of Present Illness, Review of Systems and Physical Exam were personally obtained by me. Portions of this information were initially documented by the CMA and reviewed by me for thoroughness and accuracy.     Mila Merry, MD  [facility] [phone] (phone) [phone] (fax)  [facility]

## 2022-12-20 NOTE — Patient Instructions (Addendum)
We are going to change Omega-3-acid Ethyl Esters (Lovaza) to icosopent ethyl (Vascepa) with your next mail order refill in August. Continue taking Omega-3-acid Esters until then

## 2022-12-21 LAB — PAIN MGT SCRN (14 DRUGS), UR
Amphetamine Scrn, Ur: NEGATIVE ng/mL
BARBITURATE SCREEN URINE: NEGATIVE ng/mL
BENZODIAZEPINE SCREEN, URINE: NEGATIVE ng/mL
Buprenorphine, Urine: NEGATIVE ng/mL
CANNABINOIDS UR QL SCN: NEGATIVE ng/mL
Cocaine (Metab) Scrn, Ur: NEGATIVE ng/mL
Creatinine(Crt), U: 32.5 mg/dL (ref 20.0–300.0)
Fentanyl, Urine: NEGATIVE pg/mL
Meperidine Screen, Urine: NEGATIVE ng/mL
Methadone Screen, Urine: NEGATIVE ng/mL
OXYCODONE+OXYMORPHONE UR QL SCN: NEGATIVE ng/mL
Opiate Scrn, Ur: POSITIVE ng/mL — AB
Ph of Urine: 5.6 (ref 4.5–8.9)
Phencyclidine Qn, Ur: NEGATIVE ng/mL
Propoxyphene Scrn, Ur: NEGATIVE ng/mL
Tramadol Screen, Urine: NEGATIVE ng/mL

## 2022-12-21 LAB — PSA TOTAL (REFLEX TO FREE): Prostate Specific Ag, Serum: 0.3 ng/mL (ref 0.0–4.0)

## 2022-12-21 LAB — COMPREHENSIVE METABOLIC PANEL
ALT: 21 IU/L (ref 0–44)
AST: 40 IU/L (ref 0–40)
Albumin/Globulin Ratio: 2 (ref 1.2–2.2)
Albumin: 4.3 g/dL (ref 3.9–4.9)
Alkaline Phosphatase: 77 IU/L (ref 44–121)
BUN/Creatinine Ratio: 10 (ref 10–24)
BUN: 10 mg/dL (ref 8–27)
Bilirubin Total: 0.9 mg/dL (ref 0.0–1.2)
CO2: 24 mmol/L (ref 20–29)
Calcium: 9.5 mg/dL (ref 8.6–10.2)
Chloride: 91 mmol/L — ABNORMAL LOW (ref 96–106)
Creatinine, Ser: 0.98 mg/dL (ref 0.76–1.27)
Globulin, Total: 2.2 g/dL (ref 1.5–4.5)
Glucose: 163 mg/dL — ABNORMAL HIGH (ref 70–99)
Potassium: 4.7 mmol/L (ref 3.5–5.2)
Sodium: 129 mmol/L — ABNORMAL LOW (ref 134–144)
Total Protein: 6.5 g/dL (ref 6.0–8.5)
eGFR: 86 mL/min/{1.73_m2} (ref >59)

## 2022-12-21 LAB — LIPID PANEL
Chol/HDL Ratio: 2.3 ratio (ref 0.0–5.0)
Cholesterol, Total: 163 mg/dL (ref 100–199)
HDL: 71 mg/dL (ref >39)
LDL Chol Calc (NIH): 81 mg/dL (ref 0–99)
Triglycerides: 56 mg/dL (ref 0–149)
VLDL Cholesterol Cal: 11 mg/dL (ref 5–40)

## 2022-12-21 LAB — CBC
Hematocrit: 33.4 % — ABNORMAL LOW (ref 37.5–51.0)
Hemoglobin: 11.1 g/dL — ABNORMAL LOW (ref 13.0–17.7)
MCH: 34.6 pg — ABNORMAL HIGH (ref 26.6–33.0)
MCHC: 33.2 g/dL (ref 31.5–35.7)
MCV: 104 fL — ABNORMAL HIGH (ref 79–97)
Platelets: 129 10*3/uL — ABNORMAL LOW (ref 150–450)
RBC: 3.21 x10E6/uL — ABNORMAL LOW (ref 4.14–5.80)
RDW: 13.1 % (ref 11.6–15.4)
WBC: 5.1 10*3/uL (ref 3.4–10.8)

## 2022-12-21 LAB — VITAMIN D 25 HYDROXY (VIT D DEFICIENCY, FRACTURES): Vit D, 25-Hydroxy: 85.1 ng/mL (ref 30.0–100.0)

## 2022-12-21 LAB — TSH: TSH: 1.43 u[IU]/mL (ref 0.450–4.500)

## 2022-12-21 LAB — SPECIMEN STATUS REPORT

## 2022-12-25 ENCOUNTER — Other Ambulatory Visit: Payer: Self-pay | Admitting: Family Medicine

## 2022-12-25 ENCOUNTER — Encounter: Payer: Self-pay | Admitting: Family Medicine

## 2022-12-25 DIAGNOSIS — M5137 Other intervertebral disc degeneration, lumbosacral region: Secondary | ICD-10-CM

## 2022-12-27 ENCOUNTER — Other Ambulatory Visit: Payer: Self-pay | Admitting: Family Medicine

## 2022-12-27 DIAGNOSIS — M5137 Other intervertebral disc degeneration, lumbosacral region: Secondary | ICD-10-CM

## 2022-12-27 MED ORDER — HYDROCODONE-ACETAMINOPHEN 5-325 MG PO TABS
1.0000 | ORAL_TABLET | Freq: Four times a day (QID) | ORAL | 0 refills | Status: DC | PRN
Start: 2022-12-27 — End: 2022-12-27

## 2022-12-27 MED ORDER — HYDROCODONE-ACETAMINOPHEN 5-325 MG PO TABS
1.0000 | ORAL_TABLET | Freq: Four times a day (QID) | ORAL | 0 refills | Status: DC | PRN
Start: 2022-12-27 — End: 2023-01-06

## 2022-12-31 ENCOUNTER — Other Ambulatory Visit: Payer: BC Managed Care – PPO

## 2022-12-31 ENCOUNTER — Other Ambulatory Visit: Payer: Self-pay | Admitting: Cardiovascular Disease

## 2022-12-31 DIAGNOSIS — I2510 Atherosclerotic heart disease of native coronary artery without angina pectoris: Secondary | ICD-10-CM | POA: Diagnosis not present

## 2023-01-01 LAB — PROTIME-INR
INR: 2.2 — ABNORMAL HIGH (ref 0.9–1.2)
Prothrombin Time: 22.8 s — ABNORMAL HIGH (ref 9.1–12.0)

## 2023-01-06 ENCOUNTER — Other Ambulatory Visit: Payer: Self-pay | Admitting: Family Medicine

## 2023-01-06 DIAGNOSIS — M5137 Other intervertebral disc degeneration, lumbosacral region: Secondary | ICD-10-CM

## 2023-01-06 MED ORDER — HYDROCODONE-ACETAMINOPHEN 5-325 MG PO TABS
1.0000 | ORAL_TABLET | Freq: Four times a day (QID) | ORAL | 0 refills | Status: DC | PRN
Start: 2023-01-06 — End: 2023-01-17

## 2023-01-10 ENCOUNTER — Other Ambulatory Visit: Payer: Self-pay | Admitting: Family Medicine

## 2023-01-10 DIAGNOSIS — F419 Anxiety disorder, unspecified: Secondary | ICD-10-CM

## 2023-01-13 ENCOUNTER — Encounter: Payer: Self-pay | Admitting: Family Medicine

## 2023-01-13 NOTE — Telephone Encounter (Signed)
Please advise 

## 2023-01-15 ENCOUNTER — Other Ambulatory Visit: Payer: Self-pay

## 2023-01-15 DIAGNOSIS — F419 Anxiety disorder, unspecified: Secondary | ICD-10-CM

## 2023-01-17 ENCOUNTER — Other Ambulatory Visit: Payer: Self-pay | Admitting: Cardiovascular Disease

## 2023-01-17 ENCOUNTER — Other Ambulatory Visit: Payer: BC Managed Care – PPO

## 2023-01-17 ENCOUNTER — Other Ambulatory Visit: Payer: Self-pay | Admitting: Family Medicine

## 2023-01-17 DIAGNOSIS — I2510 Atherosclerotic heart disease of native coronary artery without angina pectoris: Secondary | ICD-10-CM | POA: Diagnosis not present

## 2023-01-17 DIAGNOSIS — M5137 Other intervertebral disc degeneration, lumbosacral region: Secondary | ICD-10-CM

## 2023-01-18 LAB — PROTIME-INR
INR: 2.3 — ABNORMAL HIGH (ref 0.9–1.2)
Prothrombin Time: 23.9 s — ABNORMAL HIGH (ref 9.1–12.0)

## 2023-01-20 ENCOUNTER — Encounter: Payer: Self-pay | Admitting: Cardiovascular Disease

## 2023-01-20 MED ORDER — HYDROCODONE-ACETAMINOPHEN 5-325 MG PO TABS
1.0000 | ORAL_TABLET | Freq: Four times a day (QID) | ORAL | 0 refills | Status: DC | PRN
Start: 2023-01-20 — End: 2023-01-30

## 2023-01-30 ENCOUNTER — Other Ambulatory Visit: Payer: Self-pay | Admitting: Family Medicine

## 2023-01-30 DIAGNOSIS — M5137 Other intervertebral disc degeneration, lumbosacral region: Secondary | ICD-10-CM

## 2023-01-30 MED ORDER — HYDROCODONE-ACETAMINOPHEN 5-325 MG PO TABS
1.0000 | ORAL_TABLET | Freq: Four times a day (QID) | ORAL | 0 refills | Status: DC | PRN
Start: 2023-01-30 — End: 2023-02-11

## 2023-02-02 ENCOUNTER — Other Ambulatory Visit: Payer: Self-pay | Admitting: Cardiovascular Disease

## 2023-02-11 ENCOUNTER — Other Ambulatory Visit: Payer: Self-pay | Admitting: Family Medicine

## 2023-02-11 DIAGNOSIS — M5137 Other intervertebral disc degeneration, lumbosacral region: Secondary | ICD-10-CM

## 2023-02-12 ENCOUNTER — Telehealth: Payer: Self-pay | Admitting: Family Medicine

## 2023-02-12 NOTE — Telephone Encounter (Signed)
Patient needs warfarin 1 mg sent to pharmacy. Please advise.  [facility] [place]

## 2023-02-13 ENCOUNTER — Other Ambulatory Visit: Payer: Self-pay | Admitting: Family Medicine

## 2023-02-13 ENCOUNTER — Other Ambulatory Visit: Payer: Self-pay | Admitting: Cardiology

## 2023-02-13 DIAGNOSIS — E782 Mixed hyperlipidemia: Secondary | ICD-10-CM

## 2023-02-13 MED ORDER — HYDROCODONE-ACETAMINOPHEN 5-325 MG PO TABS
1.0000 | ORAL_TABLET | Freq: Four times a day (QID) | ORAL | 0 refills | Status: DC | PRN
Start: 2023-02-13 — End: 2023-02-25

## 2023-02-13 MED ORDER — WARFARIN SODIUM 1 MG PO TABS: 1.0000 mg | ORAL_TABLET | Freq: Every day | ORAL | 3 refills | Status: DC

## 2023-02-13 MED ORDER — ICOSAPENT ETHYL 1 G PO CAPS
2.0000 g | ORAL_CAPSULE | Freq: Two times a day (BID) | ORAL | 3 refills | Status: DC
Start: 2023-02-13 — End: 2024-04-05

## 2023-02-14 ENCOUNTER — Other Ambulatory Visit: Payer: Self-pay | Admitting: Cardiovascular Disease

## 2023-02-25 ENCOUNTER — Other Ambulatory Visit: Payer: Self-pay | Admitting: Family Medicine

## 2023-02-25 DIAGNOSIS — M5137 Other intervertebral disc degeneration, lumbosacral region: Secondary | ICD-10-CM

## 2023-02-27 MED ORDER — HYDROCODONE-ACETAMINOPHEN 5-325 MG PO TABS
1.0000 | ORAL_TABLET | Freq: Four times a day (QID) | ORAL | 0 refills | Status: DC | PRN
Start: 2023-02-27 — End: 2023-03-09

## 2023-03-03 ENCOUNTER — Other Ambulatory Visit: Payer: BC Managed Care – PPO

## 2023-03-03 ENCOUNTER — Other Ambulatory Visit: Payer: Self-pay | Admitting: Family Medicine

## 2023-03-03 ENCOUNTER — Ambulatory Visit (INDEPENDENT_AMBULATORY_CARE_PROVIDER_SITE_OTHER): Payer: BC Managed Care – PPO | Admitting: Family Medicine

## 2023-03-03 ENCOUNTER — Other Ambulatory Visit: Payer: Self-pay | Admitting: Cardiovascular Disease

## 2023-03-03 ENCOUNTER — Encounter: Payer: Self-pay | Admitting: Family Medicine

## 2023-03-03 VITALS — BP 127/57 | HR 85 | Ht 72.0 in | Wt 208.0 lb

## 2023-03-03 DIAGNOSIS — G8929 Other chronic pain: Secondary | ICD-10-CM

## 2023-03-03 DIAGNOSIS — R609 Edema, unspecified: Secondary | ICD-10-CM | POA: Diagnosis not present

## 2023-03-03 DIAGNOSIS — R739 Hyperglycemia, unspecified: Secondary | ICD-10-CM

## 2023-03-03 DIAGNOSIS — Z23 Encounter for immunization: Secondary | ICD-10-CM

## 2023-03-03 DIAGNOSIS — I4819 Other persistent atrial fibrillation: Secondary | ICD-10-CM | POA: Diagnosis not present

## 2023-03-03 DIAGNOSIS — I10 Essential (primary) hypertension: Secondary | ICD-10-CM | POA: Diagnosis not present

## 2023-03-03 DIAGNOSIS — M545 Low back pain, unspecified: Secondary | ICD-10-CM

## 2023-03-03 DIAGNOSIS — F419 Anxiety disorder, unspecified: Secondary | ICD-10-CM

## 2023-03-03 DIAGNOSIS — I2510 Atherosclerotic heart disease of native coronary artery without angina pectoris: Secondary | ICD-10-CM | POA: Diagnosis not present

## 2023-03-03 DIAGNOSIS — M5137 Other intervertebral disc degeneration, lumbosacral region: Secondary | ICD-10-CM

## 2023-03-03 DIAGNOSIS — I8390 Asymptomatic varicose veins of unspecified lower extremity: Secondary | ICD-10-CM

## 2023-03-03 DIAGNOSIS — M51379 Other intervertebral disc degeneration, lumbosacral region without mention of lumbar back pain or lower extremity pain: Secondary | ICD-10-CM

## 2023-03-03 LAB — POCT GLYCOSYLATED HEMOGLOBIN (HGB A1C)
Est. average glucose Bld gHb Est-mCnc: 105
Hemoglobin A1C: 5.3 % (ref 4.0–5.6)

## 2023-03-03 MED ORDER — FUROSEMIDE 20 MG PO TABS
20.0000 mg | ORAL_TABLET | ORAL | Status: DC
Start: 2023-03-03 — End: 2023-07-07

## 2023-03-03 NOTE — Patient Instructions (Signed)
.   Please review the attached list of medications and notify my office if there are any errors.   . Please bring all of your medications to every appointment so we can make sure that our medication list is the same as yours.   

## 2023-03-03 NOTE — Progress Notes (Signed)
Established patient visit   Patient: Isaac Hancock   DOB: 06/02/1956   67 y.o. Male  MRN: [id number] Visit Date: 03/03/2023  Today's healthcare provider: Mila Merry, MD   Chief Complaint  Patient presents with   Medical Management of Chronic Issues   Hypertension   Subjective    Hypertension Pertinent negatives include no chest pain, palpitations or shortness of breath.    Follow up hypertension elevated blood sugar from last visit. Feels well. Continues on 3-4 hydrocodone/apap for DDD and low back pain which remains effective.   Lab Results  Component Value Date   NA 129 (L) 12/20/2022   K 4.7 12/20/2022   CREATININE 0.98 12/20/2022   EGFR 86 12/20/2022   GLUCOSE 163 (H) 12/20/2022     Medications: Outpatient Medications Prior to Visit  Medication Sig   ALPRAZolam (XANAX) 0.5 MG tablet TAKE 1 TABLET(0.5 MG) BY MOUTH THREE TIMES DAILY AS NEEDED FOR ANXIETY   amLODipine (NORVASC) 10 MG tablet TAKE 1 TABLET DAILY   atorvastatin (LIPITOR) 80 MG tablet TAKE 1 TABLET DAILY   colchicine 0.6 MG tablet TAKE 2 TABLETS ON FIRST DAY OF GOUT FLARE THEN TAKE 1 TABLET DAILY AS NEEDED. REDUCE ATORVASTATIN TO 1/2 TABLET DAILY ON DAYS YOU TAKE COLCHICINE.   cyclobenzaprine (FLEXERIL) 10 MG tablet Take 1 tablet (10 mg total) by mouth 3 (three) times daily as needed for muscle spasms.   enalapril (VASOTEC) 20 MG tablet TAKE 1 TABLET TWICE A DAY   fexofenadine (ALLEGRA) 60 MG tablet Take 60 mg by mouth 2 (two) times daily.    HYDROcodone-acetaminophen (NORCO/VICODIN) 5-325 MG tablet Take 1-2 tablets by mouth every 6 (six) hours as needed for moderate pain.   icosapent Ethyl (VASCEPA) 1 g capsule Take 2 capsules (2 g total) by mouth 2 (two) times daily. TAKE IN PLACE OF LOVAZA (OMEGA-3 ETHYL ESTERS)   metoprolol (TOPROL-XL) 200 MG 24 hr tablet TAKE 1 TABLET DAILY (NEED TO SCHEDULE OFFICE VISIT FOR FOLLOW UP)   MULTIPLE VITAMIN PO Take 1 tablet by mouth daily.    NON FORMULARY     omeprazole (PRILOSEC) 20 MG capsule TAKE 1 CAPSULE DAILY   Vitamin D, Ergocalciferol, (DRISDOL) 1.25 MG (50000 UNIT) CAPS capsule TAKE 1 CAPSULE ONCE WEEKLY   warfarin (COUMADIN) 1 MG tablet Take 1 tablet (1 mg total) by mouth daily.   warfarin (COUMADIN) 2 MG tablet Take 1 tablet (2 mg total) by mouth daily.   warfarin (COUMADIN) 6 MG tablet TAKE 1 TABLET BY MOUTH EVERY DAY AS DIRECTED   [DISCONTINUED] furosemide (LASIX) 20 MG tablet Take 20 mg by mouth daily.    No facility-administered medications prior to visit.    Review of Systems  Constitutional:  Negative for appetite change, chills and fever.  Respiratory:  Negative for chest tightness, shortness of breath and wheezing.   Cardiovascular:  Negative for chest pain and palpitations.  Gastrointestinal:  Negative for abdominal pain, nausea and vomiting.       Objective    BP (!) 127/57 (BP Location: Left Arm, Patient Position: Sitting, Cuff Size: Normal)   Pulse 85   Ht 6' (1.829 m)   Wt 208 lb (94.3 kg)   SpO2 100%   BMI 28.21 kg/m   Physical Exam    General: Appearance:    Well developed, well nourished male in no acute distress  Eyes:    PERRL, conjunctiva/corneas clear, EOM's intact       Lungs:  Clear to auscultation bilaterally, respirations unlabored  Heart:    Normal heart rate. Irregularly irregular rhythm. No murmurs, rubs, or gallops.  A metallic click heard   MS:   All extremities are intact.    Neurologic:   Awake, alert, oriented x 3. No apparent focal neurological defect.         Results for orders placed or performed in visit on 03/03/23  POCT HgB A1C  Result Value Ref Range   Hemoglobin A1C 5.3 4.0 - 5.6 %   Est. average glucose Bld gHb Est-mCnc 105     Assessment & Plan     1. Primary hypertension Well controlled.  Continue current medications.   - EKG 12-Lead  2. Hyperglycemia A1c normal today.   3. Persistent atrial fibrillation (HCC) Newly diagnoses. Is already on warfarin for  history mechanical valve. Not tachycardic. Copy EKG sent to his cardiologist.   4. Edema, unspecified type Mild hyponatremia on last labs. reduce furosemide (LASIX) 20 MG tablet to Take 1 tablet (20 mg total) by mouth every other day.  5. DDD (degenerative disc disease), lumbosacral   6. Chronic midline low back pain, unspecified whether sciatica present Pain well controlled on current hydrocodone/apap regiment.   7. Varicose veins of calf   8. Need for influenza vaccination  - Flu Vaccine QUAD High Dose(Fluad)  Return in about 4 months (around 07/03/2023).         Mila Merry, MD  [facility] [phone] (phone) [phone] (fax)  [facility]

## 2023-03-04 ENCOUNTER — Encounter: Payer: Self-pay | Admitting: Cardiovascular Disease

## 2023-03-04 LAB — PROTIME-INR
INR: 1.7 — ABNORMAL HIGH (ref 0.9–1.2)
Prothrombin Time: 18.2 s — ABNORMAL HIGH (ref 9.1–12.0)

## 2023-03-05 ENCOUNTER — Encounter: Payer: Self-pay | Admitting: Family Medicine

## 2023-03-09 ENCOUNTER — Other Ambulatory Visit: Payer: Self-pay | Admitting: Family Medicine

## 2023-03-09 DIAGNOSIS — M5137 Other intervertebral disc degeneration, lumbosacral region: Secondary | ICD-10-CM

## 2023-03-10 ENCOUNTER — Encounter: Payer: Self-pay | Admitting: Cardiovascular Disease

## 2023-03-10 ENCOUNTER — Ambulatory Visit (INDEPENDENT_AMBULATORY_CARE_PROVIDER_SITE_OTHER): Payer: BC Managed Care – PPO | Admitting: Cardiovascular Disease

## 2023-03-10 VITALS — BP 122/61 | HR 57 | Ht 72.0 in | Wt 207.6 lb

## 2023-03-10 DIAGNOSIS — I10 Essential (primary) hypertension: Secondary | ICD-10-CM | POA: Diagnosis not present

## 2023-03-10 DIAGNOSIS — I4819 Other persistent atrial fibrillation: Secondary | ICD-10-CM | POA: Diagnosis not present

## 2023-03-10 DIAGNOSIS — Z952 Presence of prosthetic heart valve: Secondary | ICD-10-CM | POA: Diagnosis not present

## 2023-03-10 DIAGNOSIS — E782 Mixed hyperlipidemia: Secondary | ICD-10-CM | POA: Diagnosis not present

## 2023-03-10 NOTE — Progress Notes (Signed)
Cardiology Office Note   Date:  03/10/2023   ID:  Vinal, Nosbisch [birth year], MRN [id number]  PCP:  Malva Limes, MD  Cardiologist:  Adrian Blackwater, MD      History of Present Illness: Isaac Hancock is a 67 y.o. male who presents for  Chief Complaint  Patient presents with   Follow-up    Doing fine, had issues with INR      Past Medical History:  Diagnosis Date   History of chicken pox    History of measles    History of mumps      Past Surgical History:  Procedure Laterality Date   AORTIC VALVE REPLACEMENT  08/2009   Mechanical; [facility]. [place]. discharged on Coumadin and Toprol XL   CARDIAC CATHETERIZATION  08/2009   Normal Coronary Arteries, Normal EF; Severe aortic stenosis   COLONOSCOPY  12/2010   Normal; symptoms: OC Light +. Iftikhar   DOPPLER ECHOCARDIOGRAPHY  02/18/2012   normal EF. Done by Dr. Welton Flakes for follow AV repair   ETT  2005   Cardiolite 07/2009: inferior- leteral ischemia   FOOT SURGERY Left 04/16/2013   HERNIA REPAIR  06/2011   UMBILICAL     Current Outpatient Medications  Medication Sig Dispense Refill   ALPRAZolam (XANAX) 0.5 MG tablet TAKE 1 TABLET(0.5 MG) BY MOUTH THREE TIMES DAILY AS NEEDED FOR ANXIETY 60 tablet 5   amLODipine (NORVASC) 10 MG tablet TAKE 1 TABLET DAILY 90 tablet 3   atorvastatin (LIPITOR) 80 MG tablet TAKE 1 TABLET DAILY 90 tablet 3   colchicine 0.6 MG tablet TAKE 2 TABLETS ON FIRST DAY OF GOUT FLARE THEN TAKE 1 TABLET DAILY AS NEEDED. REDUCE ATORVASTATIN TO 1/2 TABLET DAILY ON DAYS YOU TAKE COLCHICINE. 20 tablet 5   cyclobenzaprine (FLEXERIL) 10 MG tablet Take 1 tablet (10 mg total) by mouth 3 (three) times daily as needed for muscle spasms. 30 tablet 0   enalapril (VASOTEC) 20 MG tablet TAKE 1 TABLET TWICE A DAY 180 tablet 1   fexofenadine (ALLEGRA) 60 MG tablet Take 60 mg by mouth 2 (two) times daily.      furosemide (LASIX) 20 MG tablet Take 1 tablet (20 mg total) by mouth every other day.      HYDROcodone-acetaminophen (NORCO/VICODIN) 5-325 MG tablet Take 1-2 tablets by mouth every 6 (six) hours as needed for moderate pain. 60 tablet 0   icosapent Ethyl (VASCEPA) 1 g capsule Take 2 capsules (2 g total) by mouth 2 (two) times daily. TAKE IN PLACE OF LOVAZA (OMEGA-3 ETHYL ESTERS) 360 capsule 3   metoprolol (TOPROL-XL) 200 MG 24 hr tablet TAKE 1 TABLET DAILY (NEED TO SCHEDULE OFFICE VISIT FOR FOLLOW UP) 90 tablet 4   MULTIPLE VITAMIN PO Take 1 tablet by mouth daily.      NON FORMULARY      omeprazole (PRILOSEC) 20 MG capsule TAKE 1 CAPSULE DAILY 90 capsule 3   Vitamin D, Ergocalciferol, (DRISDOL) 1.25 MG (50000 UNIT) CAPS capsule TAKE 1 CAPSULE ONCE WEEKLY (Patient not taking: Reported on 03/10/2023) 12 capsule 3   warfarin (COUMADIN) 1 MG tablet Take 1 tablet (1 mg total) by mouth daily. 90 tablet 3   warfarin (COUMADIN) 2 MG tablet Take 1 tablet (2 mg total) by mouth daily. 30 tablet 11   warfarin (COUMADIN) 6 MG tablet TAKE 1 TABLET BY MOUTH EVERY DAY AS DIRECTED 30 tablet 0   No current facility-administered medications for this visit.    Allergies:  Crestor [rosuvastatin calcium]    Social History:   reports that he has never smoked. He has quit using smokeless tobacco.  His smokeless tobacco use included snuff and chew. He reports current alcohol use. He reports that he does not use drugs.   Family History:  family history includes Hypertension in his mother.    ROS:     Review of Systems  Constitutional: Negative.   HENT: Negative.    Eyes: Negative.   Respiratory: Negative.    Gastrointestinal: Negative.   Genitourinary: Negative.   Musculoskeletal: Negative.   Skin: Negative.   Neurological: Negative.   Endo/Heme/Allergies: Negative.   Psychiatric/Behavioral: Negative.    All other systems reviewed and are negative.     All other systems are reviewed and negative.    PHYSICAL EXAM: VS:  BP 122/61   Pulse (!) 57   Ht 6' (1.829 m)   Wt 207 lb 9.6 oz (94.2  kg)   SpO2 98%   BMI 28.16 kg/m  , BMI Body mass index is 28.16 kg/m. Last weight:  Wt Readings from Last 3 Encounters:  03/10/23 207 lb 9.6 oz (94.2 kg)  03/03/23 208 lb (94.3 kg)  12/20/22 209 lb 3.2 oz (94.9 kg)     Physical Exam Vitals reviewed.  Constitutional:      Appearance: Normal appearance. He is normal weight.  HENT:     Head: Normocephalic.     Nose: Nose normal.     Mouth/Throat:     Mouth: Mucous membranes are moist.  Eyes:     Pupils: Pupils are equal, round, and reactive to light.  Cardiovascular:     Rate and Rhythm: Normal rate and regular rhythm.     Pulses: Normal pulses.     Heart sounds: Normal heart sounds.  Pulmonary:     Effort: Pulmonary effort is normal.  Abdominal:     General: Abdomen is flat. Bowel sounds are normal.  Musculoskeletal:        General: Normal range of motion.     Cervical back: Normal range of motion.  Skin:    General: Skin is warm.  Neurological:     General: No focal deficit present.     Mental Status: He is alert.  Psychiatric:        Mood and Affect: Mood normal.       EKG:   Recent Labs: 12/20/2022: ALT 21; BUN 10; Creatinine, Ser 0.98; Hemoglobin 11.1; Platelets 129; Potassium 4.7; Sodium 129; TSH 1.430    Lipid Panel    Component Value Date/Time   CHOL 163 12/20/2022 1014   CHOL 252 (H) 12/01/2013 1734   TRIG 56 12/20/2022 1014   TRIG 68 12/01/2013 1734   HDL 71 12/20/2022 1014   HDL 85 (H) 12/01/2013 1734   CHOLHDL 2.3 12/20/2022 1014   CHOLHDL 7.1 (H) 05/29/2017 0828   VLDL 14 12/01/2013 1734   LDLCALC 81 12/20/2022 1014   LDLCALC  05/29/2017 0828     Comment:     . LDL cholesterol not calculated. Triglyceride levels greater than 400 mg/dL invalidate calculated LDL results. . Reference range: <100 . Desirable range <100 mg/dL for primary prevention;   <70 mg/dL for patients with CHD or diabetic patients  with > or = 2 CHD risk factors. Marland Kitchen LDL-C is now calculated using the Martin-Hopkins   calculation, which is a validated novel method providing  better accuracy than the Friedewald equation in the  estimation of LDL-C.  Horald Pollen et  al. JAMA. 7846;962(95): 2061-2068  (http://education.[facility].com/faq/FAQ164)    LDLCALC 153 (H) 12/01/2013 1734      Other studies Reviewed: Additional studies/ records that were reviewed today include:  Review of the above records demonstrates:       No data to display            ASSESSMENT AND PLAN:    ICD-10-CM   1. Persistent atrial fibrillation (HCC)  I48.19    3.5 some days and rest 4.5 daily coumadin with INR 2.3 las month    2. Primary hypertension  I10     3. H/O aortic valve replacement  Z95.2     4. Hyperlipidemia, mixed  E78.2        Problem List Items Addressed This Visit       Cardiovascular and Mediastinum   Primary hypertension   Persistent atrial fibrillation (HCC) - Primary     Other   Hyperlipidemia, mixed   H/O aortic valve replacement       Disposition:   Return in about 3 months (around 06/10/2023).    Total time spent: 30 minutes  Signed,  Adrian Blackwater, MD  03/10/2023 9:19 AM    [facility]

## 2023-03-11 ENCOUNTER — Other Ambulatory Visit: Payer: Self-pay | Admitting: Family Medicine

## 2023-03-11 DIAGNOSIS — M5137 Other intervertebral disc degeneration, lumbosacral region: Secondary | ICD-10-CM

## 2023-03-11 MED ORDER — HYDROCODONE-ACETAMINOPHEN 5-325 MG PO TABS
1.0000 | ORAL_TABLET | Freq: Four times a day (QID) | ORAL | 0 refills | Status: DC | PRN
Start: 2023-03-11 — End: 2023-03-21

## 2023-03-11 NOTE — Telephone Encounter (Signed)
Requested Prescriptions  Pending Prescriptions Disp Refills   atorvastatin (LIPITOR) 80 MG tablet [Pharmacy Med Name: ATORVASTATIN TABS 80MG ] 90 tablet 3    Sig: TAKE 1 TABLET DAILY     Cardiovascular:  Antilipid - Statins Failed - 03/09/2023  3:21 PM      Failed - Lipid Panel in normal range within the last 12 months    Cholesterol, Total  Date Value Ref Range Status  12/20/2022 163 100 - 199 mg/dL Final   Cholesterol  Date Value Ref Range Status  12/01/2013 252 (H) 0 - 200 mg/dL Final   Ldl Cholesterol, Calc  Date Value Ref Range Status  12/01/2013 153 (H) 0 - 100 mg/dL Final   LDL Cholesterol (Calc)  Date Value Ref Range Status  05/29/2017  mg/dL (calc) Final    Comment:    . LDL cholesterol not calculated. Triglyceride levels greater than 400 mg/dL invalidate calculated LDL results. . Reference range: <100 . Desirable range <100 mg/dL for primary prevention;   <70 mg/dL for patients with CHD or diabetic patients  with > or = 2 CHD risk factors. Marland Kitchen LDL-C is now calculated using the Martin-Hopkins  calculation, which is a validated novel method providing  better accuracy than the Friedewald equation in the  estimation of LDL-C.  Horald Pollen et al. Lenox Ahr. 2440;102(72): 2061-2068  (http://education.[facility].com/faq/FAQ164)    LDL Chol Calc (NIH)  Date Value Ref Range Status  12/20/2022 81 0 - 99 mg/dL Final   HDL Cholesterol  Date Value Ref Range Status  12/01/2013 85 (H) 40 - 60 mg/dL Final   HDL  Date Value Ref Range Status  12/20/2022 71 >39 mg/dL Final   Triglycerides  Date Value Ref Range Status  12/20/2022 56 0 - 149 mg/dL Final  53/66/4403 68 0 - 200 mg/dL Final         Passed - Patient is not pregnant      Passed - Valid encounter within last 12 months    Recent Outpatient Visits           1 week ago Primary hypertension   [facility] Malva Limes, MD   2 months ago Primary hypertension   [place]  [facility] Malva Limes, MD   1 year ago Anemia associated with nutritional deficiency   [facility] Malva Limes, MD   1 year ago DDD (degenerative disc disease), lumbosacral    [facility] Malva Limes, MD   2 years ago Need for tetanus, diphtheria, and acellular pertussis (Tdap) vaccine in patient of adolescent age or older   [facility] Malva Limes, MD       Future Appointments             In 3 months Laurier Nancy, MD [facility]   In 3 months Fisher, Demetrios Isaacs, MD [facility]

## 2023-03-20 ENCOUNTER — Ambulatory Visit: Payer: BC Managed Care – PPO | Admitting: Cardiovascular Disease

## 2023-03-21 ENCOUNTER — Other Ambulatory Visit: Payer: Self-pay | Admitting: Family Medicine

## 2023-03-21 DIAGNOSIS — M5137 Other intervertebral disc degeneration, lumbosacral region: Secondary | ICD-10-CM

## 2023-03-21 MED ORDER — HYDROCODONE-ACETAMINOPHEN 5-325 MG PO TABS
1.0000 | ORAL_TABLET | Freq: Four times a day (QID) | ORAL | 0 refills | Status: DC | PRN
Start: 2023-03-21 — End: 2023-04-01

## 2023-04-01 ENCOUNTER — Other Ambulatory Visit: Payer: Self-pay | Admitting: Family Medicine

## 2023-04-01 DIAGNOSIS — M5137 Other intervertebral disc degeneration, lumbosacral region: Secondary | ICD-10-CM

## 2023-04-02 MED ORDER — HYDROCODONE-ACETAMINOPHEN 5-325 MG PO TABS
1.0000 | ORAL_TABLET | Freq: Four times a day (QID) | ORAL | 0 refills | Status: AC | PRN
Start: 2023-04-02 — End: ?

## 2023-04-13 ENCOUNTER — Other Ambulatory Visit: Payer: Self-pay | Admitting: Cardiovascular Disease

## 2023-04-13 DIAGNOSIS — I10 Essential (primary) hypertension: Secondary | ICD-10-CM

## 2023-04-14 ENCOUNTER — Other Ambulatory Visit: Payer: Self-pay | Admitting: Family Medicine

## 2023-04-14 DIAGNOSIS — M51379 Other intervertebral disc degeneration, lumbosacral region without mention of lumbar back pain or lower extremity pain: Secondary | ICD-10-CM

## 2023-04-14 MED ORDER — HYDROCODONE-ACETAMINOPHEN 5-325 MG PO TABS: 1.0000 | ORAL_TABLET | Freq: Four times a day (QID) | ORAL | 0 refills | Status: DC | PRN

## 2023-04-24 ENCOUNTER — Other Ambulatory Visit: Payer: Self-pay | Admitting: Family Medicine

## 2023-04-24 DIAGNOSIS — M51379 Other intervertebral disc degeneration, lumbosacral region without mention of lumbar back pain or lower extremity pain: Secondary | ICD-10-CM

## 2023-04-27 ENCOUNTER — Encounter: Payer: Self-pay | Admitting: Family Medicine

## 2023-04-28 ENCOUNTER — Other Ambulatory Visit: Payer: Self-pay

## 2023-04-28 ENCOUNTER — Other Ambulatory Visit: Payer: Self-pay | Admitting: Family Medicine

## 2023-04-28 DIAGNOSIS — M51379 Other intervertebral disc degeneration, lumbosacral region without mention of lumbar back pain or lower extremity pain: Secondary | ICD-10-CM

## 2023-04-28 MED ORDER — HYDROCODONE-ACETAMINOPHEN 5-325 MG PO TABS: 1.0000 | ORAL_TABLET | Freq: Four times a day (QID) | ORAL | 0 refills | Status: DC | PRN

## 2023-04-28 NOTE — Telephone Encounter (Signed)
Refill request routed to provider.

## 2023-05-07 ENCOUNTER — Other Ambulatory Visit: Payer: Self-pay | Admitting: Family Medicine

## 2023-05-07 DIAGNOSIS — M51379 Other intervertebral disc degeneration, lumbosacral region without mention of lumbar back pain or lower extremity pain: Secondary | ICD-10-CM

## 2023-05-08 MED ORDER — HYDROCODONE-ACETAMINOPHEN 5-325 MG PO TABS: 1.0000 | ORAL_TABLET | Freq: Four times a day (QID) | ORAL | 0 refills | Status: DC | PRN

## 2023-05-11 ENCOUNTER — Encounter: Payer: Self-pay | Admitting: Family Medicine

## 2023-05-11 ENCOUNTER — Other Ambulatory Visit: Payer: Self-pay | Admitting: Family Medicine

## 2023-05-11 DIAGNOSIS — I10 Essential (primary) hypertension: Secondary | ICD-10-CM

## 2023-05-20 ENCOUNTER — Other Ambulatory Visit: Payer: Self-pay | Admitting: Family Medicine

## 2023-05-20 ENCOUNTER — Telehealth: Payer: Self-pay | Admitting: Family Medicine

## 2023-05-20 DIAGNOSIS — M51379 Other intervertebral disc degeneration, lumbosacral region without mention of lumbar back pain or lower extremity pain: Secondary | ICD-10-CM

## 2023-05-20 MED ORDER — SILDENAFIL CITRATE 50 MG PO TABS: 50.0000 mg | ORAL_TABLET | Freq: Every day | ORAL | 0 refills | Status: DC | PRN

## 2023-05-20 NOTE — Telephone Encounter (Signed)
Received a fax from covermymeds for Sildenafil Citrate 50mg   Key: [id number]  I think this is the code.  It was distorted on the fax.

## 2023-05-22 MED ORDER — HYDROCODONE-ACETAMINOPHEN 5-325 MG PO TABS: 1.0000 | ORAL_TABLET | Freq: Four times a day (QID) | ORAL | 0 refills | Status: DC | PRN

## 2023-05-22 NOTE — Telephone Encounter (Signed)
PA initiated

## 2023-06-04 ENCOUNTER — Other Ambulatory Visit: Payer: Self-pay | Admitting: Family Medicine

## 2023-06-04 DIAGNOSIS — M51379 Other intervertebral disc degeneration, lumbosacral region without mention of lumbar back pain or lower extremity pain: Secondary | ICD-10-CM

## 2023-06-05 ENCOUNTER — Other Ambulatory Visit: Payer: Self-pay | Admitting: Family Medicine

## 2023-06-05 MED ORDER — HYDROCODONE-ACETAMINOPHEN 5-325 MG PO TABS: 1.0000 | ORAL_TABLET | Freq: Four times a day (QID) | ORAL | 0 refills | Status: DC | PRN

## 2023-06-06 MED ORDER — SILDENAFIL CITRATE 50 MG PO TABS: 50.0000 mg | ORAL_TABLET | Freq: Every day | ORAL | 5 refills | Status: DC | PRN

## 2023-06-10 ENCOUNTER — Encounter: Payer: Self-pay | Admitting: Cardiovascular Disease

## 2023-06-10 ENCOUNTER — Ambulatory Visit (INDEPENDENT_AMBULATORY_CARE_PROVIDER_SITE_OTHER): Payer: BC Managed Care – PPO | Admitting: Cardiovascular Disease

## 2023-06-10 ENCOUNTER — Other Ambulatory Visit: Payer: Self-pay | Admitting: Cardiovascular Disease

## 2023-06-10 VITALS — BP 120/70 | HR 77 | Ht 72.0 in | Wt 209.2 lb

## 2023-06-10 DIAGNOSIS — G4733 Obstructive sleep apnea (adult) (pediatric): Secondary | ICD-10-CM

## 2023-06-10 DIAGNOSIS — I2510 Atherosclerotic heart disease of native coronary artery without angina pectoris: Secondary | ICD-10-CM | POA: Diagnosis not present

## 2023-06-10 DIAGNOSIS — Z952 Presence of prosthetic heart valve: Secondary | ICD-10-CM

## 2023-06-10 DIAGNOSIS — I10 Essential (primary) hypertension: Secondary | ICD-10-CM | POA: Diagnosis not present

## 2023-06-10 DIAGNOSIS — E782 Mixed hyperlipidemia: Secondary | ICD-10-CM | POA: Diagnosis not present

## 2023-06-10 DIAGNOSIS — I4819 Other persistent atrial fibrillation: Secondary | ICD-10-CM | POA: Diagnosis not present

## 2023-06-10 NOTE — Progress Notes (Signed)
Cardiology Office Note   Date:  06/10/2023   ID:  JVAUGHN VANTASSELL, DOB Jul 09, 1956, MRN [id number]  PCP:  Malva Limes, MD  Cardiologist:  Adrian Blackwater, MD      History of Present Illness: VIRGINIA HARLEMAN is a 67 y.o. male who presents for  Chief Complaint  Patient presents with   Follow-up    3 month follow up    Feel fine      Past Medical History:  Diagnosis Date   History of chicken pox    History of measles    History of mumps      Past Surgical History:  Procedure Laterality Date   AORTIC VALVE REPLACEMENT  08/2009   Mechanical; [facility]. [facility]. discharged on Coumadin and Toprol XL   CARDIAC CATHETERIZATION  08/2009   Normal Coronary Arteries, Normal EF; Severe aortic stenosis   COLONOSCOPY  12/2010   Normal; symptoms: OC Light +. Iftikhar   DOPPLER ECHOCARDIOGRAPHY  02/18/2012   normal EF. Done by Dr. Welton Flakes for follow AV repair   ETT  2005   Cardiolite 07/2009: inferior- leteral ischemia   FOOT SURGERY Left 04/16/2013   HERNIA REPAIR  06/2011   UMBILICAL     Current Outpatient Medications  Medication Sig Dispense Refill   ALPRAZolam (XANAX) 0.5 MG tablet TAKE 1 TABLET(0.5 MG) BY MOUTH THREE TIMES DAILY AS NEEDED FOR ANXIETY 60 tablet 5   amLODipine (NORVASC) 10 MG tablet TAKE 1 TABLET DAILY 90 tablet 3   atorvastatin (LIPITOR) 80 MG tablet TAKE 1 TABLET DAILY 90 tablet 3   colchicine 0.6 MG tablet TAKE 2 TABLETS ON FIRST DAY OF GOUT FLARE THEN TAKE 1 TABLET DAILY AS NEEDED. REDUCE ATORVASTATIN TO 1/2 TABLET DAILY ON DAYS YOU TAKE COLCHICINE. 20 tablet 5   cyclobenzaprine (FLEXERIL) 10 MG tablet Take 1 tablet (10 mg total) by mouth 3 (three) times daily as needed for muscle spasms. 30 tablet 0   enalapril (VASOTEC) 20 MG tablet TAKE 1 TABLET TWICE A DAY 180 tablet 3   fexofenadine (ALLEGRA) 60 MG tablet Take 60 mg by mouth 2 (two) times daily.      furosemide (LASIX) 20 MG tablet Take 1 tablet (20 mg total) by mouth every other day.      HYDROcodone-acetaminophen (NORCO/VICODIN) 5-325 MG tablet Take 1-2 tablets by mouth every 6 (six) hours as needed for moderate pain (pain score 4-6). 60 tablet 0   icosapent Ethyl (VASCEPA) 1 g capsule Take 2 capsules (2 g total) by mouth 2 (two) times daily. TAKE IN PLACE OF LOVAZA (OMEGA-3 ETHYL ESTERS) 360 capsule 3   metoprolol (TOPROL-XL) 200 MG 24 hr tablet TAKE 1 TABLET DAILY (NEED TO SCHEDULE OFFICE VISIT FOR FOLLOW UP) 90 tablet 3   MULTIPLE VITAMIN PO Take 1 tablet by mouth daily.      omeprazole (PRILOSEC) 20 MG capsule TAKE 1 CAPSULE DAILY 90 capsule 3   sildenafil (VIAGRA) 50 MG tablet Take 1 tablet (50 mg total) by mouth daily as needed for erectile dysfunction. 10 tablet 5   Vitamin D, Ergocalciferol, (DRISDOL) 1.25 MG (50000 UNIT) CAPS capsule TAKE 1 CAPSULE ONCE WEEKLY 12 capsule 3   warfarin (COUMADIN) 1 MG tablet Take 1 tablet (1 mg total) by mouth daily. (Patient taking differently: Take 0.5 mg by mouth daily.) 90 tablet 3   warfarin (COUMADIN) 3 MG tablet Take 3 mg by mouth.     warfarin (COUMADIN) 4 MG tablet Take 4 mg by mouth.  NON FORMULARY  (Patient not taking: Reported on 06/10/2023)     warfarin (COUMADIN) 2 MG tablet Take 1 tablet (2 mg total) by mouth daily. (Patient not taking: Reported on 06/10/2023) 30 tablet 11   warfarin (COUMADIN) 6 MG tablet TAKE 1 TABLET BY MOUTH EVERY DAY AS DIRECTED (Patient not taking: Reported on 06/10/2023) 30 tablet 0   No current facility-administered medications for this visit.    Allergies:   Crestor [rosuvastatin calcium]    Social History:   reports that he has never smoked. He has quit using smokeless tobacco.  His smokeless tobacco use included snuff and chew. He reports current alcohol use. He reports that he does not use drugs.   Family History:  family history includes Hypertension in his mother.    ROS:     Review of Systems  Constitutional: Negative.   HENT: Negative.    Eyes: Negative.   Respiratory: Negative.     Gastrointestinal: Negative.   Genitourinary: Negative.   Musculoskeletal: Negative.   Skin: Negative.   Neurological: Negative.   Endo/Heme/Allergies: Negative.   Psychiatric/Behavioral: Negative.    All other systems reviewed and are negative.     All other systems are reviewed and negative.    PHYSICAL EXAM: VS:  BP 120/70   Pulse 77   Ht 6' (1.829 m)   Wt 209 lb 3.2 oz (94.9 kg)   SpO2 98%   BMI 28.37 kg/m  , BMI Body mass index is 28.37 kg/m. Last weight:  Wt Readings from Last 3 Encounters:  06/10/23 209 lb 3.2 oz (94.9 kg)  03/10/23 207 lb 9.6 oz (94.2 kg)  03/03/23 208 lb (94.3 kg)     Physical Exam Vitals reviewed.  Constitutional:      Appearance: Normal appearance. He is normal weight.  HENT:     Head: Normocephalic.     Nose: Nose normal.     Mouth/Throat:     Mouth: Mucous membranes are moist.  Eyes:     Pupils: Pupils are equal, round, and reactive to light.  Cardiovascular:     Rate and Rhythm: Normal rate and regular rhythm.     Pulses: Normal pulses.     Heart sounds: Normal heart sounds.  Pulmonary:     Effort: Pulmonary effort is normal.  Abdominal:     General: Abdomen is flat. Bowel sounds are normal.  Musculoskeletal:        General: Normal range of motion.     Cervical back: Normal range of motion.  Skin:    General: Skin is warm.  Neurological:     General: No focal deficit present.     Mental Status: He is alert.  Psychiatric:        Mood and Affect: Mood normal.       EKG:   Recent Labs: 12/20/2022: ALT 21; BUN 10; Creatinine, Ser 0.98; Hemoglobin 11.1; Platelets 129; Potassium 4.7; Sodium 129; TSH 1.430    Lipid Panel    Component Value Date/Time   CHOL 163 12/20/2022 1014   CHOL 252 (H) 12/01/2013 1734   TRIG 56 12/20/2022 1014   TRIG 68 12/01/2013 1734   HDL 71 12/20/2022 1014   HDL 85 (H) 12/01/2013 1734   CHOLHDL 2.3 12/20/2022 1014   CHOLHDL 7.1 (H) 05/29/2017 0828   VLDL 14 12/01/2013 1734   LDLCALC 81  12/20/2022 1014   LDLCALC  05/29/2017 0828     Comment:     . LDL cholesterol not calculated. Triglyceride  levels greater than 400 mg/dL invalidate calculated LDL results. . Reference range: <100 . Desirable range <100 mg/dL for primary prevention;   <70 mg/dL for patients with CHD or diabetic patients  with > or = 2 CHD risk factors. Marland Kitchen LDL-C is now calculated using the Martin-Hopkins  calculation, which is a validated novel method providing  better accuracy than the Friedewald equation in the  estimation of LDL-C.  Horald Pollen et al. Lenox Ahr. 7829;562(13): 2061-2068  (http://education.[employer].com/faq/FAQ164)    LDLCALC 153 (H) 12/01/2013 1734      Other studies Reviewed: Additional studies/ records that were reviewed today include:  Review of the above records demonstrates:       No data to display            ASSESSMENT AND PLAN:    ICD-10-CM   1. Primary hypertension  I10 Protime-INR    PCV ECHOCARDIOGRAM COMPLETE    2. Persistent atrial fibrillation (HCC)  I48.19 Protime-INR    PCV ECHOCARDIOGRAM COMPLETE    3. OSA on CPAP  G47.33 Protime-INR    PCV ECHOCARDIOGRAM COMPLETE    4. Hyperlipidemia, mixed  E78.2 Protime-INR    PCV ECHOCARDIOGRAM COMPLETE    5. H/O aortic valve replacement  Z95.2 Protime-INR    PCV ECHOCARDIOGRAM COMPLETE   Takes coumadin 3mg  4 days a week, 4.5 mg rest of days. INR 1.8 few months back, needs to be checked today       Problem List Items Addressed This Visit       Cardiovascular and Mediastinum   Primary hypertension - Primary   Relevant Medications   warfarin (COUMADIN) 4 MG tablet   warfarin (COUMADIN) 3 MG tablet   Other Relevant Orders   Protime-INR   PCV ECHOCARDIOGRAM COMPLETE   Persistent atrial fibrillation (HCC)   Relevant Medications   warfarin (COUMADIN) 4 MG tablet   warfarin (COUMADIN) 3 MG tablet   Other Relevant Orders   Protime-INR   PCV ECHOCARDIOGRAM COMPLETE     Respiratory   OSA on CPAP    Relevant Orders   Protime-INR   PCV ECHOCARDIOGRAM COMPLETE     Other   Hyperlipidemia, mixed   Relevant Medications   warfarin (COUMADIN) 4 MG tablet   warfarin (COUMADIN) 3 MG tablet   Other Relevant Orders   Protime-INR   PCV ECHOCARDIOGRAM COMPLETE   H/O aortic valve replacement   Relevant Orders   Protime-INR   PCV ECHOCARDIOGRAM COMPLETE       Disposition:   Return in about 4 weeks (around 07/08/2023) for echo, and f/u.    Total time spent: 30 minutes  Signed,  Adrian Blackwater, MD  06/10/2023 9:30 AM    [facility]

## 2023-06-11 ENCOUNTER — Telehealth: Payer: Self-pay

## 2023-06-11 LAB — PROTIME-INR
INR: 3.5 — ABNORMAL HIGH (ref 0.9–1.2)
Prothrombin Time: 35.3 s — ABNORMAL HIGH (ref 9.1–12.0)

## 2023-06-11 NOTE — Telephone Encounter (Signed)
Received fax with pts most recent INR results 06/10/2023   INR: 3.5

## 2023-06-12 ENCOUNTER — Other Ambulatory Visit: Payer: Self-pay | Admitting: Family Medicine

## 2023-06-12 DIAGNOSIS — M51379 Other intervertebral disc degeneration, lumbosacral region without mention of lumbar back pain or lower extremity pain: Secondary | ICD-10-CM

## 2023-06-15 MED ORDER — HYDROCODONE-ACETAMINOPHEN 5-325 MG PO TABS: 1.0000 | ORAL_TABLET | Freq: Four times a day (QID) | ORAL | 0 refills | Status: DC | PRN

## 2023-06-16 ENCOUNTER — Encounter: Payer: Self-pay | Admitting: Cardiovascular Disease

## 2023-06-26 ENCOUNTER — Other Ambulatory Visit: Payer: Self-pay | Admitting: Family Medicine

## 2023-06-26 DIAGNOSIS — M51379 Other intervertebral disc degeneration, lumbosacral region without mention of lumbar back pain or lower extremity pain: Secondary | ICD-10-CM

## 2023-06-26 MED ORDER — HYDROCODONE-ACETAMINOPHEN 5-325 MG PO TABS: 1.0000 | ORAL_TABLET | Freq: Four times a day (QID) | ORAL | 0 refills | Status: DC | PRN

## 2023-06-30 ENCOUNTER — Other Ambulatory Visit: Payer: Self-pay | Admitting: Family Medicine

## 2023-06-30 NOTE — Addendum Note (Signed)
Addended by: Marjie Skiff on: 06/30/2023 02:52 PM   Modules accepted: Orders

## 2023-06-30 NOTE — Telephone Encounter (Signed)
Looks like a prescription was sent in to [facility] pharmacy 06/06/23 qty:10 r:5. [facility]

## 2023-06-30 NOTE — Telephone Encounter (Signed)
[facility] faxed a refill authorization for Sildenafil 50 mg. 90 day supply with 3 refills. Needs directions as well.

## 2023-07-02 ENCOUNTER — Ambulatory Visit: Payer: BC Managed Care – PPO

## 2023-07-02 DIAGNOSIS — I351 Nonrheumatic aortic (valve) insufficiency: Secondary | ICD-10-CM | POA: Diagnosis not present

## 2023-07-02 DIAGNOSIS — I10 Essential (primary) hypertension: Secondary | ICD-10-CM

## 2023-07-02 DIAGNOSIS — I371 Nonrheumatic pulmonary valve insufficiency: Secondary | ICD-10-CM

## 2023-07-02 DIAGNOSIS — E782 Mixed hyperlipidemia: Secondary | ICD-10-CM

## 2023-07-02 DIAGNOSIS — Z952 Presence of prosthetic heart valve: Secondary | ICD-10-CM

## 2023-07-02 DIAGNOSIS — I4819 Other persistent atrial fibrillation: Secondary | ICD-10-CM

## 2023-07-02 DIAGNOSIS — I361 Nonrheumatic tricuspid (valve) insufficiency: Secondary | ICD-10-CM | POA: Diagnosis not present

## 2023-07-02 DIAGNOSIS — I340 Nonrheumatic mitral (valve) insufficiency: Secondary | ICD-10-CM | POA: Diagnosis not present

## 2023-07-02 DIAGNOSIS — G4733 Obstructive sleep apnea (adult) (pediatric): Secondary | ICD-10-CM

## 2023-07-04 ENCOUNTER — Ambulatory Visit (INDEPENDENT_AMBULATORY_CARE_PROVIDER_SITE_OTHER): Payer: BC Managed Care – PPO | Admitting: Family Medicine

## 2023-07-04 ENCOUNTER — Telehealth: Payer: Self-pay | Admitting: Family Medicine

## 2023-07-04 VITALS — BP 108/42 | HR 59 | Resp 18 | Ht 72.0 in | Wt 212.0 lb

## 2023-07-04 DIAGNOSIS — F1190 Opioid use, unspecified, uncomplicated: Secondary | ICD-10-CM

## 2023-07-04 DIAGNOSIS — I10 Essential (primary) hypertension: Secondary | ICD-10-CM

## 2023-07-04 DIAGNOSIS — E782 Mixed hyperlipidemia: Secondary | ICD-10-CM

## 2023-07-04 DIAGNOSIS — I4819 Other persistent atrial fibrillation: Secondary | ICD-10-CM

## 2023-07-04 DIAGNOSIS — R42 Dizziness and giddiness: Secondary | ICD-10-CM

## 2023-07-04 DIAGNOSIS — M545 Other chronic pain: Secondary | ICD-10-CM

## 2023-07-04 DIAGNOSIS — G8929 Other chronic pain: Secondary | ICD-10-CM

## 2023-07-04 DIAGNOSIS — E871 Hypo-osmolality and hyponatremia: Secondary | ICD-10-CM

## 2023-07-04 MED ORDER — SILDENAFIL CITRATE 50 MG PO TABS: 50.0000 mg | ORAL_TABLET | Freq: Every day | ORAL | 1 refills | Status: DC | PRN

## 2023-07-04 MED ORDER — AMLODIPINE BESYLATE 10 MG PO TABS: 5.0000 mg | ORAL_TABLET | Freq: Every day | ORAL | Status: DC

## 2023-07-04 NOTE — Telephone Encounter (Signed)
[facility] is requesting medication refill sildenafil (VIAGRA) 50 MG tablet  Please advise

## 2023-07-04 NOTE — Telephone Encounter (Signed)
Will address at today's visit.

## 2023-07-04 NOTE — Progress Notes (Signed)
Established patient visit   Patient: Isaac Hancock   DOB: [birth year]   67 y.o. Male  MRN: [id number] Visit Date: 07/04/2023  Today's healthcare provider: Mila Merry, MD   Chief Complaint  Patient presents with   Hypertension   Subjective    Hypertension Pertinent negatives include no chest pain, palpitations or shortness of breath.    Follow up htn, lipids, chronic back pain. Doing well on current medications. No new complaint. Req rf sildenafil. Not taking nitrates. Followed by Dr. Welton Flakes for a-fib and warfarin management. Has echo scheduled on the 23rd. Denies swelling, cp, or dyspnea. Feels a little dizzy at times.   Lab Results  Component Value Date   NA 129 (L) 12/20/2022   CL 91 (L) 12/20/2022   K 4.7 12/20/2022   CO2 24 12/20/2022   BUN 10 12/20/2022   CREATININE 0.98 12/20/2022   EGFR 86 12/20/2022   CALCIUM 9.5 12/20/2022   PHOS 3.0 01/18/2021   ALBUMIN 4.3 12/20/2022   GLUCOSE 163 (H) 12/20/2022   Lab Results  Component Value Date   HGBA1C 5.3 03/03/2023   Lab Results  Component Value Date   CHOL 163 12/20/2022   HDL 71 12/20/2022   LDLCALC 81 12/20/2022   TRIG 56 12/20/2022   CHOLHDL 2.3 12/20/2022   Lab Results  Component Value Date   TSH 1.430 12/20/2022      Medications: Outpatient Medications Prior to Visit  Medication Sig   ALPRAZolam (XANAX) 0.5 MG tablet TAKE 1 TABLET(0.5 MG) BY MOUTH THREE TIMES DAILY AS NEEDED FOR ANXIETY   atorvastatin (LIPITOR) 80 MG tablet TAKE 1 TABLET DAILY   colchicine 0.6 MG tablet TAKE 2 TABLETS ON FIRST DAY OF GOUT FLARE THEN TAKE 1 TABLET DAILY AS NEEDED. REDUCE ATORVASTATIN TO 1/2 TABLET DAILY ON DAYS YOU TAKE COLCHICINE.   cyclobenzaprine (FLEXERIL) 10 MG tablet Take 1 tablet (10 mg total) by mouth 3 (three) times daily as needed for muscle spasms.   enalapril (VASOTEC) 20 MG tablet TAKE 1 TABLET TWICE A DAY   fexofenadine (ALLEGRA) 60 MG tablet Take 60 mg by mouth 2 (two) times daily.    furosemide  (LASIX) 20 MG tablet Take 1 tablet (20 mg total) by mouth every other day. (Patient taking differently: Take 40 mg by mouth 2 (two) times daily.)   HYDROcodone-acetaminophen (NORCO/VICODIN) 5-325 MG tablet Take 1-2 tablets by mouth every 6 (six) hours as needed for moderate pain (pain score 4-6).   icosapent Ethyl (VASCEPA) 1 g capsule Take 2 capsules (2 g total) by mouth 2 (two) times daily. TAKE IN PLACE OF LOVAZA (OMEGA-3 ETHYL ESTERS)   metoprolol (TOPROL-XL) 200 MG 24 hr tablet TAKE 1 TABLET DAILY (NEED TO SCHEDULE OFFICE VISIT FOR FOLLOW UP)   MULTIPLE VITAMIN PO Take 1 tablet by mouth daily.    NON FORMULARY    omeprazole (PRILOSEC) 20 MG capsule TAKE 1 CAPSULE DAILY   Vitamin D, Ergocalciferol, (DRISDOL) 1.25 MG (50000 UNIT) CAPS capsule TAKE 1 CAPSULE ONCE WEEKLY   warfarin (COUMADIN) 1 MG tablet Take 1 tablet (1 mg total) by mouth daily. (Patient taking differently: Take 0.5 mg by mouth daily.)   warfarin (COUMADIN) 2 MG tablet Take 1 tablet (2 mg total) by mouth daily.   warfarin (COUMADIN) 3 MG tablet Take 3 mg by mouth.   warfarin (COUMADIN) 4 MG tablet Take 4 mg by mouth.   warfarin (COUMADIN) 6 MG tablet TAKE 1 TABLET BY MOUTH EVERY DAY AS  DIRECTED   amLODipine (NORVASC) 10 MG tablet TAKE 1 TABLET DAILY   sildenafil (VIAGRA) 50 MG tablet Take 1 tablet (50 mg total) by mouth daily as needed for erectile dysfunction.   No facility-administered medications prior to visit.    Review of Systems  Constitutional:  Negative for appetite change, chills and fever.  Respiratory:  Negative for chest tightness, shortness of breath and wheezing.   Cardiovascular:  Negative for chest pain and palpitations.  Gastrointestinal:  Negative for abdominal pain, nausea and vomiting.       Objective    BP (!) 108/42   Pulse (!) 59   Resp 18   Ht 6' (1.829 m)   Wt 212 lb (96.2 kg)   SpO2 100%   BMI 28.75 kg/m    Physical Exam  General appearance: Well developed, well nourished male,  cooperative and in no acute distress Head: Normocephalic, without obvious abnormality, atraumatic Respiratory: Respirations even and unlabored, normal respiratory rate Extremities: All extremities are intact.  Skin: Skin color, texture, turgor normal. No rashes seen  Psych: Appropriate mood and affect. Neurologic: Mental status: Alert, oriented to person, place, and time, thought content appropriate.   Assessment & Plan     1. Orthostatic dizziness (Primary) Reduce amlodipine to 1/2 tablet daily until he sees the prescriber  2. Primary hypertension  3. Chronic, continuous use of opioids Well controlled on current medications.   4. Persistent atrial fibrillation (HCC) Rate controlled on warfarin managed by cardiology.   5. Hyponatremia He states he is taking 2 20mg  furosemide twice a day prescribed by cardiology.  - Renal function panel Send copy labs to cardiologist.   6. Hyperlipidemia, mixed He is tolerating atorvastatin well with no adverse effects.   - Lipid panel  7. Chronic midline low back pain, unspecified whether sciatica present Well controlled on current medications.   8. ED - sildenafil (VIAGRA) 50 MG tablet; Take 1 tablet (50 mg total) by mouth daily as needed for erectile dysfunction.  Dispense: 30 tablet; Refill: 1  Advise not to take if feeling dizzy at all, or if he is ever prescribed nitrates.        Mila Merry, MD  [facility] [phone] (phone) [phone] (fax)  [facility]

## 2023-07-04 NOTE — Patient Instructions (Signed)
Please review the attached list of medications and notify my office if there are any errors.   Reduced amlodipine to 1/2 of the 10mg  tablet once a day due to dizziness and low blood pressure

## 2023-07-05 LAB — RENAL FUNCTION PANEL
Albumin: 4.7 g/dL (ref 3.9–4.9)
BUN/Creatinine Ratio: 13 (ref 10–24)
BUN: 16 mg/dL (ref 8–27)
CO2: 21 mmol/L (ref 20–29)
Calcium: 9.1 mg/dL (ref 8.6–10.2)
Chloride: 90 mmol/L — ABNORMAL LOW (ref 96–106)
Creatinine, Ser: 1.2 mg/dL (ref 0.76–1.27)
Glucose: 95 mg/dL (ref 70–99)
Phosphorus: 4 mg/dL (ref 2.8–4.1)
Potassium: 5 mmol/L (ref 3.5–5.2)
Sodium: 128 mmol/L — ABNORMAL LOW (ref 134–144)
eGFR: 67 mL/min/{1.73_m2} (ref >59)

## 2023-07-05 LAB — LIPID PANEL
Chol/HDL Ratio: 2.1 {ratio} (ref 0.0–5.0)
Cholesterol, Total: 160 mg/dL (ref 100–199)
HDL: 76 mg/dL (ref >39)
LDL Chol Calc (NIH): 67 mg/dL (ref 0–99)
Triglycerides: 93 mg/dL (ref 0–149)
VLDL Cholesterol Cal: 17 mg/dL (ref 5–40)

## 2023-07-07 ENCOUNTER — Ambulatory Visit (INDEPENDENT_AMBULATORY_CARE_PROVIDER_SITE_OTHER): Payer: BC Managed Care – PPO | Admitting: Cardiovascular Disease

## 2023-07-07 ENCOUNTER — Other Ambulatory Visit: Payer: Self-pay | Admitting: Family Medicine

## 2023-07-07 ENCOUNTER — Encounter: Payer: Self-pay | Admitting: Cardiovascular Disease

## 2023-07-07 ENCOUNTER — Other Ambulatory Visit: Payer: Self-pay | Admitting: Cardiovascular Disease

## 2023-07-07 VITALS — BP 114/64 | HR 65 | Ht 72.0 in | Wt 219.2 lb

## 2023-07-07 DIAGNOSIS — G4733 Obstructive sleep apnea (adult) (pediatric): Secondary | ICD-10-CM | POA: Diagnosis not present

## 2023-07-07 DIAGNOSIS — E871 Hypo-osmolality and hyponatremia: Secondary | ICD-10-CM

## 2023-07-07 DIAGNOSIS — M51379 Other intervertebral disc degeneration, lumbosacral region without mention of lumbar back pain or lower extremity pain: Secondary | ICD-10-CM

## 2023-07-07 DIAGNOSIS — R609 Edema, unspecified: Secondary | ICD-10-CM

## 2023-07-07 DIAGNOSIS — I4819 Other persistent atrial fibrillation: Secondary | ICD-10-CM | POA: Diagnosis not present

## 2023-07-07 DIAGNOSIS — E782 Mixed hyperlipidemia: Secondary | ICD-10-CM

## 2023-07-07 DIAGNOSIS — I10 Essential (primary) hypertension: Secondary | ICD-10-CM | POA: Diagnosis not present

## 2023-07-07 DIAGNOSIS — Z952 Presence of prosthetic heart valve: Secondary | ICD-10-CM

## 2023-07-07 DIAGNOSIS — I2510 Atherosclerotic heart disease of native coronary artery without angina pectoris: Secondary | ICD-10-CM | POA: Diagnosis not present

## 2023-07-07 MED ORDER — FUROSEMIDE 20 MG PO TABS: 20.0000 mg | ORAL_TABLET | ORAL | 2 refills | Status: DC

## 2023-07-07 NOTE — Progress Notes (Signed)
Cardiology Office Note   Date:  07/07/2023   ID:  DOTSON GRASER, DOB Mar 24, 1956, MRN [id number]  PCP:  Malva Limes, MD  Cardiologist:  Adrian Blackwater, MD      History of Present Illness: Isaac Hancock is a 67 y.o. male who presents for  Chief Complaint  Patient presents with   Follow-up    4 week follow up, Echo results.    Has back pain as inablity to walk      Past Medical History:  Diagnosis Date   History of chicken pox    History of measles    History of mumps      Past Surgical History:  Procedure Laterality Date   AORTIC VALVE REPLACEMENT  08/2009   Mechanical; [facility]. [facility]. discharged on Coumadin and Toprol XL   CARDIAC CATHETERIZATION  08/2009   Normal Coronary Arteries, Normal EF; Severe aortic stenosis   COLONOSCOPY  12/2010   Normal; symptoms: OC Light +. Iftikhar   DOPPLER ECHOCARDIOGRAPHY  02/18/2012   normal EF. Done by Dr. Welton Flakes for follow AV repair   ETT  2005   Cardiolite 07/2009: inferior- leteral ischemia   FOOT SURGERY Left 04/16/2013   HERNIA REPAIR  06/2011   UMBILICAL     Current Outpatient Medications  Medication Sig Dispense Refill   ALPRAZolam (XANAX) 0.5 MG tablet TAKE 1 TABLET(0.5 MG) BY MOUTH THREE TIMES DAILY AS NEEDED FOR ANXIETY 60 tablet 5   amLODipine (NORVASC) 10 MG tablet Take 0.5 tablets (5 mg total) by mouth daily.     atorvastatin (LIPITOR) 80 MG tablet TAKE 1 TABLET DAILY 90 tablet 3   cyclobenzaprine (FLEXERIL) 10 MG tablet Take 1 tablet (10 mg total) by mouth 3 (three) times daily as needed for muscle spasms. 30 tablet 0   enalapril (VASOTEC) 20 MG tablet TAKE 1 TABLET TWICE A DAY 180 tablet 3   fexofenadine (ALLEGRA) 60 MG tablet Take 60 mg by mouth 2 (two) times daily.      HYDROcodone-acetaminophen (NORCO/VICODIN) 5-325 MG tablet Take 1-2 tablets by mouth every 6 (six) hours as needed for moderate pain (pain score 4-6). 60 tablet 0   icosapent Ethyl (VASCEPA) 1 g capsule Take 2 capsules (2 g total) by  mouth 2 (two) times daily. TAKE IN PLACE OF LOVAZA (OMEGA-3 ETHYL ESTERS) 360 capsule 3   metoprolol (TOPROL-XL) 200 MG 24 hr tablet TAKE 1 TABLET DAILY (NEED TO SCHEDULE OFFICE VISIT FOR FOLLOW UP) 90 tablet 3   MULTIPLE VITAMIN PO Take 1 tablet by mouth daily.      omeprazole (PRILOSEC) 20 MG capsule TAKE 1 CAPSULE DAILY 90 capsule 3   sildenafil (VIAGRA) 50 MG tablet Take 1 tablet (50 mg total) by mouth daily as needed for erectile dysfunction. 30 tablet 1   Vitamin D, Ergocalciferol, (DRISDOL) 1.25 MG (50000 UNIT) CAPS capsule TAKE 1 CAPSULE ONCE WEEKLY 12 capsule 3   warfarin (COUMADIN) 1 MG tablet Take 1 tablet (1 mg total) by mouth daily. (Patient taking differently: Take 0.5 mg by mouth daily.) 90 tablet 3   warfarin (COUMADIN) 3 MG tablet Take 3 mg by mouth.     warfarin (COUMADIN) 4 MG tablet Take 4 mg by mouth.     colchicine 0.6 MG tablet TAKE 2 TABLETS ON FIRST DAY OF GOUT FLARE THEN TAKE 1 TABLET DAILY AS NEEDED. REDUCE ATORVASTATIN TO 1/2 TABLET DAILY ON DAYS YOU TAKE COLCHICINE. (Patient not taking: Reported on 07/07/2023) 20 tablet 5  furosemide (LASIX) 20 MG tablet Take 1 tablet (20 mg total) by mouth every other day. 30 tablet 2   NON FORMULARY  (Patient not taking: Reported on 07/07/2023)     warfarin (COUMADIN) 2 MG tablet Take 1 tablet (2 mg total) by mouth daily. (Patient not taking: Reported on 07/07/2023) 30 tablet 11   warfarin (COUMADIN) 6 MG tablet TAKE 1 TABLET BY MOUTH EVERY DAY AS DIRECTED (Patient not taking: Reported on 07/07/2023) 30 tablet 0   No current facility-administered medications for this visit.    Allergies:   Crestor [rosuvastatin calcium]    Social History:   reports that he has never smoked. He has quit using smokeless tobacco.  His smokeless tobacco use included snuff and chew. He reports current alcohol use. He reports that he does not use drugs.   Family History:  family history includes Hypertension in his mother.    ROS:     Review of  Systems  Constitutional: Negative.   HENT: Negative.    Eyes: Negative.   Respiratory: Negative.    Gastrointestinal: Negative.   Genitourinary: Negative.   Musculoskeletal: Negative.   Skin: Negative.   Neurological: Negative.   Endo/Heme/Allergies: Negative.   Psychiatric/Behavioral: Negative.    All other systems reviewed and are negative.     All other systems are reviewed and negative.    PHYSICAL EXAM: VS:  BP 114/64   Pulse 65   Ht 6' (1.829 m)   Wt 219 lb 3.2 oz (99.4 kg)   SpO2 97%   BMI 29.73 kg/m  , BMI Body mass index is 29.73 kg/m. Last weight:  Wt Readings from Last 3 Encounters:  07/07/23 219 lb 3.2 oz (99.4 kg)  07/04/23 212 lb (96.2 kg)  06/10/23 209 lb 3.2 oz (94.9 kg)     Physical Exam Vitals reviewed.  Constitutional:      Appearance: Normal appearance. He is normal weight.  HENT:     Head: Normocephalic.     Nose: Nose normal.     Mouth/Throat:     Mouth: Mucous membranes are moist.  Eyes:     Pupils: Pupils are equal, round, and reactive to light.  Cardiovascular:     Rate and Rhythm: Normal rate and regular rhythm.     Pulses: Normal pulses.     Heart sounds: Normal heart sounds.  Pulmonary:     Effort: Pulmonary effort is normal.  Abdominal:     General: Abdomen is flat. Bowel sounds are normal.  Musculoskeletal:        General: Normal range of motion.     Cervical back: Normal range of motion.  Skin:    General: Skin is warm.  Neurological:     General: No focal deficit present.     Mental Status: He is alert.  Psychiatric:        Mood and Affect: Mood normal.       EKG:   Recent Labs: 12/20/2022: ALT 21; Hemoglobin 11.1; Platelets 129; TSH 1.430 07/04/2023: BUN 16; Creatinine, Ser 1.20; Potassium 5.0; Sodium 128    Lipid Panel    Component Value Date/Time   CHOL 160 07/04/2023 1039   CHOL 252 (H) 12/01/2013 1734   TRIG 93 07/04/2023 1039   TRIG 68 12/01/2013 1734   HDL 76 07/04/2023 1039   HDL 85 (H)  12/01/2013 1734   CHOLHDL 2.1 07/04/2023 1039   CHOLHDL 7.1 (H) 05/29/2017 0828   VLDL 14 12/01/2013 1734   LDLCALC 67 07/04/2023  1039   LDLCALC  05/29/2017 0828     Comment:     . LDL cholesterol not calculated. Triglyceride levels greater than 400 mg/dL invalidate calculated LDL results. . Reference range: <100 . Desirable range <100 mg/dL for primary prevention;   <70 mg/dL for patients with CHD or diabetic patients  with > or = 2 CHD risk factors. Marland Kitchen LDL-C is now calculated using the Martin-Hopkins  calculation, which is a validated novel method providing  better accuracy than the Friedewald equation in the  estimation of LDL-C.  Horald Pollen et al. Lenox Ahr. 4010;272(53): 2061-2068  (http://education.[employer].com/faq/FAQ164)    LDLCALC 153 (H) 12/01/2013 1734      Other studies Reviewed: Additional studies/ records that were reviewed today include:  Review of the above records demonstrates:       No data to display            ASSESSMENT AND PLAN:    ICD-10-CM   1. Primary hypertension  I10 Protime-INR    furosemide (LASIX) 20 MG tablet    2. Persistent atrial fibrillation (HCC)  I48.19 Protime-INR    furosemide (LASIX) 20 MG tablet    3. OSA on CPAP  G47.33 Protime-INR    furosemide (LASIX) 20 MG tablet    4. Hyperlipidemia, mixed  E78.2 Protime-INR    furosemide (LASIX) 20 MG tablet    5. H/O aortic valve replacement  Z95.2 Protime-INR    furosemide (LASIX) 20 MG tablet    6. Edema, unspecified type  R60.9 furosemide (LASIX) 20 MG tablet    7. Hyponatremia  E87.1    decrease lasix 20 daily       Problem List Items Addressed This Visit       Cardiovascular and Mediastinum   Primary hypertension - Primary   Relevant Medications   furosemide (LASIX) 20 MG tablet   Other Relevant Orders   Protime-INR   Persistent atrial fibrillation (HCC)   Relevant Medications   furosemide (LASIX) 20 MG tablet   Other Relevant Orders   Protime-INR      Respiratory   OSA on CPAP   Relevant Medications   furosemide (LASIX) 20 MG tablet   Other Relevant Orders   Protime-INR     Other   Hyperlipidemia, mixed   Relevant Medications   furosemide (LASIX) 20 MG tablet   Other Relevant Orders   Protime-INR   H/O aortic valve replacement   Relevant Medications   furosemide (LASIX) 20 MG tablet   Other Relevant Orders   Protime-INR   Other Visit Diagnoses       Edema, unspecified type       Relevant Medications   furosemide (LASIX) 20 MG tablet     Hyponatremia       decrease lasix 20 daily          Disposition:   Return in about 4 weeks (around 08/04/2023).    Total time spent: 30 minutes  Signed,  Adrian Blackwater, MD  07/07/2023 11:16 AM    [facility]

## 2023-07-08 LAB — PROTIME-INR
INR: 2.7 — ABNORMAL HIGH (ref 0.9–1.2)
Prothrombin Time: 28.3 s — ABNORMAL HIGH (ref 9.1–12.0)

## 2023-07-09 MED ORDER — HYDROCODONE-ACETAMINOPHEN 5-325 MG PO TABS: 1.0000 | ORAL_TABLET | Freq: Four times a day (QID) | ORAL | 0 refills | Status: DC | PRN

## 2023-07-14 DIAGNOSIS — K080 Exfoliation of teeth due to systemic causes: Secondary | ICD-10-CM | POA: Diagnosis not present

## 2023-07-21 ENCOUNTER — Other Ambulatory Visit: Payer: Self-pay | Admitting: Family Medicine

## 2023-07-21 DIAGNOSIS — M51379 Other intervertebral disc degeneration, lumbosacral region without mention of lumbar back pain or lower extremity pain: Secondary | ICD-10-CM

## 2023-07-23 MED ORDER — HYDROCODONE-ACETAMINOPHEN 5-325 MG PO TABS: 1.0000 | ORAL_TABLET | Freq: Four times a day (QID) | ORAL | 0 refills | Status: DC | PRN

## 2023-07-30 ENCOUNTER — Telehealth: Payer: Self-pay | Admitting: Family Medicine

## 2023-07-30 DIAGNOSIS — E871 Hypo-osmolality and hyponatremia: Secondary | ICD-10-CM

## 2023-07-30 NOTE — Telephone Encounter (Signed)
 Please advise patient it's time to recheck sodium level since it was so low in December. Order has been placed. Does not need to be fasting.

## 2023-07-30 NOTE — Telephone Encounter (Signed)
 Patient aware.

## 2023-08-01 ENCOUNTER — Other Ambulatory Visit: Payer: Self-pay | Admitting: Family Medicine

## 2023-08-01 DIAGNOSIS — E871 Hypo-osmolality and hyponatremia: Secondary | ICD-10-CM | POA: Diagnosis not present

## 2023-08-01 DIAGNOSIS — M51379 Other intervertebral disc degeneration, lumbosacral region without mention of lumbar back pain or lower extremity pain: Secondary | ICD-10-CM

## 2023-08-02 LAB — RENAL FUNCTION PANEL
Albumin: 4.5 g/dL (ref 3.9–4.9)
BUN/Creatinine Ratio: 10 (ref 10–24)
BUN: 13 mg/dL (ref 8–27)
CO2: 22 mmol/L (ref 20–29)
Calcium: 8.9 mg/dL (ref 8.6–10.2)
Chloride: 81 mmol/L — ABNORMAL LOW (ref 96–106)
Creatinine, Ser: 1.32 mg/dL — ABNORMAL HIGH (ref 0.76–1.27)
Glucose: 101 mg/dL — ABNORMAL HIGH (ref 70–99)
Phosphorus: 4 mg/dL (ref 2.8–4.1)
Potassium: 4.8 mmol/L (ref 3.5–5.2)
Sodium: 120 mmol/L — ABNORMAL LOW (ref 134–144)
eGFR: 60 mL/min/{1.73_m2} (ref >59)

## 2023-08-03 ENCOUNTER — Other Ambulatory Visit: Payer: Self-pay | Admitting: Family Medicine

## 2023-08-03 ENCOUNTER — Encounter: Payer: Self-pay | Admitting: Family Medicine

## 2023-08-03 MED ORDER — HYDROCODONE-ACETAMINOPHEN 5-325 MG PO TABS: 1.0000 | ORAL_TABLET | Freq: Four times a day (QID) | ORAL | 0 refills | Status: DC | PRN

## 2023-08-04 ENCOUNTER — Telehealth: Payer: Self-pay | Admitting: Family Medicine

## 2023-08-04 NOTE — Telephone Encounter (Signed)
Sodium levels are very low. Furosemide causes low sodium. Did patient stop taking furosemide as previously advised. If so then something else must be causing low sodium level. If feeling weak, dizzy or confused then need to go to ER. Otherwise we needs follow up appointment in office ASAP. Can have same day slot.

## 2023-08-04 NOTE — Telephone Encounter (Signed)
Left detailed vm and sent CRM

## 2023-08-08 ENCOUNTER — Ambulatory Visit (INDEPENDENT_AMBULATORY_CARE_PROVIDER_SITE_OTHER): Payer: BC Managed Care – PPO | Admitting: Cardiovascular Disease

## 2023-08-08 ENCOUNTER — Encounter: Payer: Self-pay | Admitting: Cardiovascular Disease

## 2023-08-08 VITALS — BP 121/72 | HR 61 | Ht 72.0 in | Wt 208.3 lb

## 2023-08-08 DIAGNOSIS — E782 Mixed hyperlipidemia: Secondary | ICD-10-CM | POA: Diagnosis not present

## 2023-08-08 DIAGNOSIS — I4819 Other persistent atrial fibrillation: Secondary | ICD-10-CM

## 2023-08-08 DIAGNOSIS — G4733 Obstructive sleep apnea (adult) (pediatric): Secondary | ICD-10-CM

## 2023-08-08 DIAGNOSIS — I10 Essential (primary) hypertension: Secondary | ICD-10-CM

## 2023-08-08 DIAGNOSIS — Z952 Presence of prosthetic heart valve: Secondary | ICD-10-CM

## 2023-08-08 NOTE — Progress Notes (Signed)
Cardiology Office Note   Date:  08/08/2023   ID:  Isaac Hancock, DOB January 26, 1956, MRN [id number]  PCP:  Malva Limes, MD  Cardiologist:  Adrian Blackwater, MD      History of Present Illness: Isaac Hancock is a 68 y.o. male who presents for  Chief Complaint  Patient presents with   Follow-up    1 month follow up     Feeling good      Past Medical History:  Diagnosis Date   History of chicken pox    History of measles    History of mumps      Past Surgical History:  Procedure Laterality Date   AORTIC VALVE REPLACEMENT  08/2009   Mechanical; [facility]. [place]. discharged on Coumadin and Toprol XL   CARDIAC CATHETERIZATION  08/2009   Normal Coronary Arteries, Normal EF; Severe aortic stenosis   COLONOSCOPY  12/2010   Normal; symptoms: OC Light +. Iftikhar   DOPPLER ECHOCARDIOGRAPHY  02/18/2012   normal EF. Done by Dr. Welton Flakes for follow AV repair   ETT  2005   Cardiolite 07/2009: inferior- leteral ischemia   FOOT SURGERY Left 04/16/2013   HERNIA REPAIR  06/2011   UMBILICAL     Current Outpatient Medications  Medication Sig Dispense Refill   ALPRAZolam (XANAX) 0.5 MG tablet TAKE 1 TABLET(0.5 MG) BY MOUTH THREE TIMES DAILY AS NEEDED FOR ANXIETY 60 tablet 5   amLODipine (NORVASC) 10 MG tablet Take 0.5 tablets (5 mg total) by mouth daily.     atorvastatin (LIPITOR) 80 MG tablet TAKE 1 TABLET DAILY 90 tablet 3   cyclobenzaprine (FLEXERIL) 10 MG tablet Take 1 tablet (10 mg total) by mouth 3 (three) times daily as needed for muscle spasms. 30 tablet 0   enalapril (VASOTEC) 20 MG tablet TAKE 1 TABLET TWICE A DAY 180 tablet 3   fexofenadine (ALLEGRA) 60 MG tablet Take 60 mg by mouth 2 (two) times daily.      furosemide (LASIX) 20 MG tablet Take 1 tablet (20 mg total) by mouth every other day. 30 tablet 2   HYDROcodone-acetaminophen (NORCO/VICODIN) 5-325 MG tablet Take 1-2 tablets by mouth every 6 (six) hours as needed for moderate pain (pain score 4-6). 60 tablet 0    icosapent Ethyl (VASCEPA) 1 g capsule Take 2 capsules (2 g total) by mouth 2 (two) times daily. TAKE IN PLACE OF LOVAZA (OMEGA-3 ETHYL ESTERS) 360 capsule 3   metoprolol (TOPROL-XL) 200 MG 24 hr tablet TAKE 1 TABLET DAILY (NEED TO SCHEDULE OFFICE VISIT FOR FOLLOW UP) 90 tablet 3   MULTIPLE VITAMIN PO Take 1 tablet by mouth daily.      NON FORMULARY      sildenafil (VIAGRA) 50 MG tablet Take 1 tablet (50 mg total) by mouth daily as needed for erectile dysfunction. 30 tablet 1   Vitamin D, Ergocalciferol, (DRISDOL) 1.25 MG (50000 UNIT) CAPS capsule TAKE 1 CAPSULE ONCE WEEKLY 12 capsule 4   warfarin (COUMADIN) 1 MG tablet Take 1 tablet (1 mg total) by mouth daily. (Patient taking differently: Take 0.5 mg by mouth daily.) 90 tablet 3   warfarin (COUMADIN) 3 MG tablet Take 3 mg by mouth.     warfarin (COUMADIN) 4 MG tablet Take 4 mg by mouth.     colchicine 0.6 MG tablet TAKE 2 TABLETS ON FIRST DAY OF GOUT FLARE THEN TAKE 1 TABLET DAILY AS NEEDED. REDUCE ATORVASTATIN TO 1/2 TABLET DAILY ON DAYS YOU TAKE COLCHICINE. (Patient not taking:  Reported on 07/07/2023) 20 tablet 5   omeprazole (PRILOSEC) 20 MG capsule TAKE 1 CAPSULE DAILY 90 capsule 3   warfarin (COUMADIN) 2 MG tablet Take 1 tablet (2 mg total) by mouth daily. (Patient not taking: Reported on 07/07/2023) 30 tablet 11   warfarin (COUMADIN) 6 MG tablet TAKE 1 TABLET BY MOUTH EVERY DAY AS DIRECTED (Patient not taking: Reported on 07/07/2023) 30 tablet 0   No current facility-administered medications for this visit.    Allergies:   Crestor [rosuvastatin calcium]    Social History:   reports that he has never smoked. He has quit using smokeless tobacco.  His smokeless tobacco use included snuff and chew. He reports current alcohol use. He reports that he does not use drugs.   Family History:  family history includes Hypertension in his mother.    ROS:     Review of Systems  Constitutional: Negative.   HENT: Negative.    Eyes: Negative.    Respiratory: Negative.    Gastrointestinal: Negative.   Genitourinary: Negative.   Musculoskeletal: Negative.   Skin: Negative.   Neurological: Negative.   Endo/Heme/Allergies: Negative.   Psychiatric/Behavioral: Negative.    All other systems reviewed and are negative.     All other systems are reviewed and negative.    PHYSICAL EXAM: VS:  BP 121/72   Pulse 61   Ht 6' (1.829 m)   Wt 208 lb 4.8 oz (94.5 kg)   SpO2 98%   BMI 28.25 kg/m  , BMI Body mass index is 28.25 kg/m. Last weight:  Wt Readings from Last 3 Encounters:  08/08/23 208 lb 4.8 oz (94.5 kg)  07/07/23 219 lb 3.2 oz (99.4 kg)  07/04/23 212 lb (96.2 kg)     Physical Exam Vitals reviewed.  Constitutional:      Appearance: Normal appearance. He is normal weight.  HENT:     Head: Normocephalic.     Nose: Nose normal.     Mouth/Throat:     Mouth: Mucous membranes are moist.  Eyes:     Pupils: Pupils are equal, round, and reactive to light.  Cardiovascular:     Rate and Rhythm: Normal rate and regular rhythm.     Pulses: Normal pulses.     Heart sounds: Normal heart sounds.  Pulmonary:     Effort: Pulmonary effort is normal.  Abdominal:     General: Abdomen is flat. Bowel sounds are normal.  Musculoskeletal:        General: Normal range of motion.     Cervical back: Normal range of motion.  Skin:    General: Skin is warm.  Neurological:     General: No focal deficit present.     Mental Status: He is alert.  Psychiatric:        Mood and Affect: Mood normal.       EKG:   Recent Labs: 12/20/2022: ALT 21; Hemoglobin 11.1; Platelets 129; TSH 1.430 08/01/2023: BUN 13; Creatinine, Ser 1.32; Potassium 4.8; Sodium 120    Lipid Panel    Component Value Date/Time   CHOL 160 07/04/2023 1039   CHOL 252 (H) 12/01/2013 1734   TRIG 93 07/04/2023 1039   TRIG 68 12/01/2013 1734   HDL 76 07/04/2023 1039   HDL 85 (H) 12/01/2013 1734   CHOLHDL 2.1 07/04/2023 1039   CHOLHDL 7.1 (H) 05/29/2017 0828    VLDL 14 12/01/2013 1734   LDLCALC 67 07/04/2023 1039   LDLCALC  05/29/2017 0828     Comment:     .  LDL cholesterol not calculated. Triglyceride levels greater than 400 mg/dL invalidate calculated LDL results. . Reference range: <100 . Desirable range <100 mg/dL for primary prevention;   <70 mg/dL for patients with CHD or diabetic patients  with > or = 2 CHD risk factors. Marland Kitchen LDL-C is now calculated using the Martin-Hopkins  calculation, which is a validated novel method providing  better accuracy than the Friedewald equation in the  estimation of LDL-C.  Horald Pollen et al. Lenox Ahr. 6578;469(62): 2061-2068  (http://education.[employer].com/faq/FAQ164)    LDLCALC 153 (H) 12/01/2013 1734      Other studies Reviewed: Additional studies/ records that were reviewed today include:  Review of the above records demonstrates:       No data to display            ASSESSMENT AND PLAN:    ICD-10-CM   1. OSA on CPAP  G47.33     2. Persistent atrial fibrillation (HCC)  I48.19     3. Primary hypertension  I10    Normal BP    4. Hyperlipidemia, mixed  E78.2     5. H/O aortic valve replacement  Z95.2    INR 2.7 and stable       Problem List Items Addressed This Visit       Cardiovascular and Mediastinum   Primary hypertension   Persistent atrial fibrillation (HCC)     Respiratory   OSA on CPAP - Primary     Other   Hyperlipidemia, mixed   H/O aortic valve replacement       Disposition:   Return in about 3 months (around 11/06/2023).    Total time spent: 30 minutes  Signed,  Adrian Blackwater, MD  08/08/2023 10:29 AM    [facility]

## 2023-08-11 ENCOUNTER — Encounter: Payer: Self-pay | Admitting: Family Medicine

## 2023-08-14 ENCOUNTER — Telehealth: Payer: Self-pay | Admitting: Family Medicine

## 2023-08-14 DIAGNOSIS — E871 Hypo-osmolality and hyponatremia: Secondary | ICD-10-CM

## 2023-08-14 DIAGNOSIS — M51379 Other intervertebral disc degeneration, lumbosacral region without mention of lumbar back pain or lower extremity pain: Secondary | ICD-10-CM

## 2023-08-15 NOTE — Telephone Encounter (Signed)
 Detailed VM left per DPR. CRM created. Ok for [facility] to advise/clarify if patient returns call

## 2023-08-15 NOTE — Telephone Encounter (Signed)
Called pt back and schedule appt at next available opening with PCP. Please enter lab order and call pt.

## 2023-08-15 NOTE — Telephone Encounter (Signed)
Please advise patient his sodium levels were critically low. He needs to come back I for follow up appointment and labs, and be sure he NOT taking furosemide.

## 2023-08-15 NOTE — Telephone Encounter (Signed)
Patient called, left VM to return the call to the office for a message from the provider. 

## 2023-08-15 NOTE — Telephone Encounter (Signed)
Pt called back and was advised of Dr. Esmond Camper message. Pt's furosemide was ordered by cardiology and pt was not aware it was even ordered and pt has not been nor will take Furosemide because he never picked it up from pharmacy. Marland Kitchen

## 2023-08-18 ENCOUNTER — Encounter: Payer: Self-pay | Admitting: Family Medicine

## 2023-08-18 ENCOUNTER — Other Ambulatory Visit: Payer: Self-pay | Admitting: Family Medicine

## 2023-08-18 DIAGNOSIS — M51379 Other intervertebral disc degeneration, lumbosacral region without mention of lumbar back pain or lower extremity pain: Secondary | ICD-10-CM

## 2023-08-18 MED ORDER — HYDROCODONE-ACETAMINOPHEN 5-325 MG PO TABS: 1.0000 | ORAL_TABLET | Freq: Four times a day (QID) | ORAL | 0 refills | Status: DC | PRN

## 2023-08-18 NOTE — Telephone Encounter (Signed)
LOV 07/04/23 Next OV 08/27/23 Last refill 08/03/23, #60, 0 refills  Please review, thanks!

## 2023-08-18 NOTE — Telephone Encounter (Signed)
 Lab orders have been placed

## 2023-08-18 NOTE — Addendum Note (Signed)
Addended by: Malva Limes on: 08/18/2023 10:32 AM   Modules accepted: Orders

## 2023-08-18 NOTE — Telephone Encounter (Signed)
 Patient aware.

## 2023-08-25 DIAGNOSIS — E871 Hypo-osmolality and hyponatremia: Secondary | ICD-10-CM | POA: Diagnosis not present

## 2023-08-27 ENCOUNTER — Encounter: Payer: Self-pay | Admitting: Family Medicine

## 2023-08-27 ENCOUNTER — Ambulatory Visit (INDEPENDENT_AMBULATORY_CARE_PROVIDER_SITE_OTHER): Payer: BC Managed Care – PPO | Admitting: Family Medicine

## 2023-08-27 VITALS — BP 142/70 | HR 63 | Resp 16 | Wt 204.7 lb

## 2023-08-27 DIAGNOSIS — I4819 Other persistent atrial fibrillation: Secondary | ICD-10-CM | POA: Diagnosis not present

## 2023-08-27 DIAGNOSIS — E222 Syndrome of inappropriate secretion of antidiuretic hormone: Secondary | ICD-10-CM

## 2023-08-27 DIAGNOSIS — I5189 Other ill-defined heart diseases: Secondary | ICD-10-CM | POA: Diagnosis not present

## 2023-08-27 DIAGNOSIS — E871 Hypo-osmolality and hyponatremia: Secondary | ICD-10-CM

## 2023-08-27 LAB — SODIUM, URINE, RANDOM: Sodium, Ur: 23 mmol/L

## 2023-08-27 LAB — CBC WITH DIFFERENTIAL/PLATELET
Basophils Absolute: 0 10*3/uL (ref 0.0–0.2)
Basos: 1 %
EOS (ABSOLUTE): 0.1 10*3/uL (ref 0.0–0.4)
Eos: 2 %
Hematocrit: 31.4 % — ABNORMAL LOW (ref 37.5–51.0)
Hemoglobin: 10.4 g/dL — ABNORMAL LOW (ref 13.0–17.7)
Immature Grans (Abs): 0 10*3/uL (ref 0.0–0.1)
Immature Granulocytes: 0 %
Lymphocytes Absolute: 1.2 10*3/uL (ref 0.7–3.1)
Lymphs: 28 %
MCH: 35 pg — ABNORMAL HIGH (ref 26.6–33.0)
MCHC: 33.1 g/dL (ref 31.5–35.7)
MCV: 106 fL — ABNORMAL HIGH (ref 79–97)
Monocytes Absolute: 0.3 10*3/uL (ref 0.1–0.9)
Monocytes: 8 %
Neutrophils Absolute: 2.7 10*3/uL (ref 1.4–7.0)
Neutrophils: 61 %
Platelets: 153 10*3/uL (ref 150–450)
RBC: 2.97 x10E6/uL — ABNORMAL LOW (ref 4.14–5.80)
RDW: 13.2 % (ref 11.6–15.4)
WBC: 4.4 10*3/uL (ref 3.4–10.8)

## 2023-08-27 LAB — COMPREHENSIVE METABOLIC PANEL
ALT: 20 [IU]/L (ref 0–44)
AST: 40 [IU]/L (ref 0–40)
Albumin: 4.3 g/dL (ref 3.9–4.9)
Alkaline Phosphatase: 90 [IU]/L (ref 44–121)
BUN/Creatinine Ratio: 9 — ABNORMAL LOW (ref 10–24)
BUN: 10 mg/dL (ref 8–27)
Bilirubin Total: 1 mg/dL (ref 0.0–1.2)
CO2: 22 mmol/L (ref 20–29)
Calcium: 8.6 mg/dL (ref 8.6–10.2)
Chloride: 93 mmol/L — ABNORMAL LOW (ref 96–106)
Creatinine, Ser: 1.09 mg/dL (ref 0.76–1.27)
Globulin, Total: 2 g/dL (ref 1.5–4.5)
Glucose: 110 mg/dL — ABNORMAL HIGH (ref 70–99)
Potassium: 4.3 mmol/L (ref 3.5–5.2)
Sodium: 130 mmol/L — ABNORMAL LOW (ref 134–144)
Total Protein: 6.3 g/dL (ref 6.0–8.5)
eGFR: 75 mL/min/{1.73_m2} (ref >59)

## 2023-08-27 LAB — OSMOLALITY, URINE: Osmolality, Ur: 178 mosm/kg

## 2023-08-27 LAB — TSH: TSH: 2.56 u[IU]/mL (ref 0.450–4.500)

## 2023-08-27 NOTE — Progress Notes (Signed)
Established patient visit   Patient: Isaac Hancock   DOB: 05/12/1956   68 y.o. Male  MRN: [id number] Visit Date: 08/27/2023  Today's healthcare provider: Mila Merry, MD   Chief Complaint  Patient presents with   Medical Management of Chronic Issues   Subjective    HPI  Follow up hyponatremia, with sodium found to be 120 at o.v 08/01/2023. He had been on furosemide which was discontinued around that time. Patient states he feels very good, especially the last few weeks. Follow up labs done 2/10 found sodium up to 130. Urine sodium 123 and urine osmolality of 178. Has some swelling with extensive varicosities left left lower leg. Has <1+ edema right lower extremities. Pt reports this is no worse than his baseline. Had echo in December with diastolic dysfunction and EF 55-60%. No dyspnea. He drinks mostly Gatorade, but not excessively.   Wt Readings from Last 8 Encounters:  08/27/23 204 lb 11.2 oz (92.9 kg)  08/08/23 208 lb 4.8 oz (94.5 kg)  07/07/23 219 lb 3.2 oz (99.4 kg)  07/04/23 212 lb (96.2 kg)  06/10/23 209 lb 3.2 oz (94.9 kg)  03/10/23 207 lb 9.6 oz (94.2 kg)  03/03/23 208 lb (94.3 kg)  12/20/22 209 lb 3.2 oz (94.9 kg)    Medications: Outpatient Medications Prior to Visit  Medication Sig Note   ALPRAZolam (XANAX) 0.5 MG tablet TAKE 1 TABLET(0.5 MG) BY MOUTH THREE TIMES DAILY AS NEEDED FOR ANXIETY    amLODipine (NORVASC) 10 MG tablet Take 0.5 tablets (5 mg total) by mouth daily.    atorvastatin (LIPITOR) 80 MG tablet TAKE 1 TABLET DAILY    cyclobenzaprine (FLEXERIL) 10 MG tablet Take 1 tablet (10 mg total) by mouth 3 (three) times daily as needed for muscle spasms.    enalapril (VASOTEC) 20 MG tablet TAKE 1 TABLET TWICE A DAY    fexofenadine (ALLEGRA) 60 MG tablet Take 60 mg by mouth 2 (two) times daily.     HYDROcodone-acetaminophen (NORCO/VICODIN) 5-325 MG tablet Take 1-2 tablets by mouth every 6 (six) hours as needed for moderate pain (pain score 4-6).     icosapent Ethyl (VASCEPA) 1 g capsule Take 2 capsules (2 g total) by mouth 2 (two) times daily. TAKE IN PLACE OF LOVAZA (OMEGA-3 ETHYL ESTERS)    metoprolol (TOPROL-XL) 200 MG 24 hr tablet TAKE 1 TABLET DAILY (NEED TO SCHEDULE OFFICE VISIT FOR FOLLOW UP)    MULTIPLE VITAMIN PO Take 1 tablet by mouth daily.     NON FORMULARY     omeprazole (PRILOSEC) 20 MG capsule TAKE 1 CAPSULE DAILY    sildenafil (VIAGRA) 50 MG tablet Take 1 tablet (50 mg total) by mouth daily as needed for erectile dysfunction.    Vitamin D, Ergocalciferol, (DRISDOL) 1.25 MG (50000 UNIT) CAPS capsule TAKE 1 CAPSULE ONCE WEEKLY    warfarin (COUMADIN) 1 MG tablet Take 1 tablet (1 mg total) by mouth daily. (Patient taking differently: Take 0.5 mg by mouth daily.)    warfarin (COUMADIN) 4 MG tablet Take 4 mg by mouth.    colchicine 0.6 MG tablet TAKE 2 TABLETS ON FIRST DAY OF GOUT FLARE THEN TAKE 1 TABLET DAILY AS NEEDED. REDUCE ATORVASTATIN TO 1/2 TABLET DAILY ON DAYS YOU TAKE COLCHICINE. (Patient not taking: Reported on 08/27/2023)    warfarin (COUMADIN) 2 MG tablet Take 1 tablet (2 mg total) by mouth daily. (Patient not taking: Reported on 07/07/2023)    warfarin (COUMADIN) 3 MG tablet Take  3 mg by mouth.    warfarin (COUMADIN) 6 MG tablet TAKE 1 TABLET BY MOUTH EVERY DAY AS DIRECTED (Patient not taking: Reported on 07/07/2023)    [DISCONTINUED] furosemide (LASIX) 20 MG tablet Take 1 tablet (20 mg total) by mouth every other day. (Patient not taking: Reported on 08/27/2023) 08/27/2023: hyponatremia   No facility-administered medications prior to visit.    Review of Systems     Objective    BP (!) 142/70   Pulse 63   Resp 16   Wt 204 lb 11.2 oz (92.9 kg)   SpO2 100%   BMI 27.76 kg/m    Physical Exam   General: Appearance:    Well developed, well nourished male in no acute distress  Eyes:    PERRL, conjunctiva/corneas clear, EOM's intact       Lungs:     Clear to auscultation bilaterally, respirations unlabored   Heart:    Normal heart rate. Irregularly irregular rhythm. No murmurs, rubs, or gallops.   MS:   All extremities are intact.    Neurologic:   Awake, alert, oriented x 3. No apparent focal neurological defect.         Assessment & Plan     1. SIADH (syndrome of inappropriate ADH production) (HCC) (Primary) Discussed fluid restriction of < 1 quart per day.   2. Hyponatremia Multifactorial, not resolved, but significantly better since stopping furosemide. Continue current medications.  Recheck electrolytes at follow up appointment in April.   3. Persistent atrial fibrillation (HCC) Rate controlled on DOAC.   4. Diastolic dysfunction Asymptomatic. Continue cardiology follow up as scheduled.          Mila Merry, MD  [facility] [phone] (phone) [phone] (fax)  [facility]

## 2023-08-27 NOTE — Patient Instructions (Signed)
Marland Kitchen  Please review the attached list of medications and notify my office if there are any errors.   . Please bring all of your medications to every appointment so we can make sure that our medication list is the same as yours.

## 2023-08-28 ENCOUNTER — Other Ambulatory Visit: Payer: Self-pay | Admitting: Family Medicine

## 2023-08-28 DIAGNOSIS — M51379 Other intervertebral disc degeneration, lumbosacral region without mention of lumbar back pain or lower extremity pain: Secondary | ICD-10-CM

## 2023-08-29 MED ORDER — HYDROCODONE-ACETAMINOPHEN 5-325 MG PO TABS: 1.0000 | ORAL_TABLET | Freq: Four times a day (QID) | ORAL | 0 refills | Status: DC | PRN

## 2023-09-04 ENCOUNTER — Other Ambulatory Visit (HOSPITAL_COMMUNITY): Payer: Self-pay

## 2023-09-06 ENCOUNTER — Other Ambulatory Visit: Payer: Self-pay | Admitting: Family Medicine

## 2023-09-06 DIAGNOSIS — F419 Anxiety disorder, unspecified: Secondary | ICD-10-CM

## 2023-09-08 NOTE — Telephone Encounter (Signed)
 Requested medication (s) are due for refill today - yes  Requested medication (s) are on the active medication list -yes  Future visit scheduled -yes  Last refill: 03/06/23 #6- 5RF  Notes to clinic: non delegated Rx  Requested Prescriptions  Pending Prescriptions Disp Refills   ALPRAZolam (XANAX) 0.5 MG tablet [Pharmacy Med Name: ALPRAZOLAM 0.5MG  TABLETS] 60 tablet     Sig: TAKE 1 TABLET(0.5 MG) BY MOUTH THREE TIMES DAILY AS NEEDED FOR ANXIETY     Not Delegated - Psychiatry: Anxiolytics/Hypnotics 2 Failed - 09/08/2023  1:30 PM      Failed - This refill cannot be delegated      Failed - Urine Drug Screen completed in last 360 days      Passed - Patient is not pregnant      Passed - Valid encounter within last 6 months    Recent Outpatient Visits           2 months ago Orthostatic dizziness   [place] [facility] Malva Limes, MD   6 months ago Primary hypertension   [place] [facility] Malva Limes, MD   8 months ago Primary hypertension   [place] Malva Limes, MD   1 year ago Anemia associated with nutritional deficiency   [facility] Malva Limes, MD   2 years ago DDD (degenerative disc disease), lumbosacral   [place] [facility] Malva Limes, MD       Future Appointments             In 1 month Fisher, Demetrios Isaacs, MD [facility]   In 1 month Laurier Nancy, MD [facility]               Requested Prescriptions  Pending Prescriptions Disp Refills   ALPRAZolam Prudy Feeler) 0.5 MG tablet [Pharmacy Med Name: ALPRAZOLAM 0.5MG  TABLETS] 60 tablet     Sig: TAKE 1 TABLET(0.5 MG) BY MOUTH THREE TIMES DAILY AS NEEDED FOR ANXIETY     Not Delegated - Psychiatry: Anxiolytics/Hypnotics 2 Failed - 09/08/2023  1:30 PM      Failed - This refill cannot be delegated      Failed - Urine Drug Screen  completed in last 360 days      Passed - Patient is not pregnant      Passed - Valid encounter within last 6 months    Recent Outpatient Visits           2 months ago Orthostatic dizziness   [place] [facility] Malva Limes, MD   6 months ago Primary hypertension   [place] [facility] Malva Limes, MD   8 months ago Primary hypertension   [place] [facility] Malva Limes, MD   1 year ago Anemia associated with nutritional deficiency   [facility] Malva Limes, MD   2 years ago DDD (degenerative disc disease), lumbosacral    [facility] Malva Limes, MD       Future Appointments             In 1 month Fisher, Demetrios Isaacs, MD [facility]   In 1 month Laurier Nancy, MD [facility]

## 2023-09-09 ENCOUNTER — Other Ambulatory Visit: Payer: Self-pay | Admitting: Family Medicine

## 2023-09-09 DIAGNOSIS — M51379 Other intervertebral disc degeneration, lumbosacral region without mention of lumbar back pain or lower extremity pain: Secondary | ICD-10-CM

## 2023-09-11 MED ORDER — HYDROCODONE-ACETAMINOPHEN 5-325 MG PO TABS: 1.0000 | ORAL_TABLET | Freq: Four times a day (QID) | ORAL | 0 refills | Status: DC | PRN

## 2023-09-19 ENCOUNTER — Other Ambulatory Visit: Payer: Self-pay | Admitting: Family Medicine

## 2023-09-19 DIAGNOSIS — M51379 Other intervertebral disc degeneration, lumbosacral region without mention of lumbar back pain or lower extremity pain: Secondary | ICD-10-CM

## 2023-09-22 MED ORDER — HYDROCODONE-ACETAMINOPHEN 5-325 MG PO TABS: 1.0000 | ORAL_TABLET | Freq: Four times a day (QID) | ORAL | 0 refills | Status: DC | PRN

## 2023-09-27 ENCOUNTER — Other Ambulatory Visit: Payer: Self-pay | Admitting: Family Medicine

## 2023-09-27 DIAGNOSIS — M109 Gout, unspecified: Secondary | ICD-10-CM

## 2023-10-01 ENCOUNTER — Other Ambulatory Visit: Payer: Self-pay | Admitting: Family Medicine

## 2023-10-01 DIAGNOSIS — M51379 Other intervertebral disc degeneration, lumbosacral region without mention of lumbar back pain or lower extremity pain: Secondary | ICD-10-CM

## 2023-10-03 MED ORDER — HYDROCODONE-ACETAMINOPHEN 5-325 MG PO TABS: 1.0000 | ORAL_TABLET | Freq: Four times a day (QID) | ORAL | 0 refills | Status: DC | PRN

## 2023-10-03 NOTE — Telephone Encounter (Signed)
 Encounter created in error

## 2023-10-14 ENCOUNTER — Other Ambulatory Visit: Payer: Self-pay | Admitting: Family Medicine

## 2023-10-14 DIAGNOSIS — M51379 Other intervertebral disc degeneration, lumbosacral region without mention of lumbar back pain or lower extremity pain: Secondary | ICD-10-CM

## 2023-10-16 MED ORDER — HYDROCODONE-ACETAMINOPHEN 5-325 MG PO TABS: 1.0000 | ORAL_TABLET | Freq: Four times a day (QID) | ORAL | 0 refills | Status: DC | PRN

## 2023-10-27 ENCOUNTER — Other Ambulatory Visit: Payer: Self-pay | Admitting: Family Medicine

## 2023-10-27 DIAGNOSIS — M51379 Other intervertebral disc degeneration, lumbosacral region without mention of lumbar back pain or lower extremity pain: Secondary | ICD-10-CM

## 2023-10-28 MED ORDER — HYDROCODONE-ACETAMINOPHEN 5-325 MG PO TABS: 1.0000 | ORAL_TABLET | Freq: Four times a day (QID) | ORAL | 0 refills | Status: DC | PRN

## 2023-11-01 ENCOUNTER — Other Ambulatory Visit: Payer: Self-pay | Admitting: Cardiovascular Disease

## 2023-11-05 ENCOUNTER — Ambulatory Visit (INDEPENDENT_AMBULATORY_CARE_PROVIDER_SITE_OTHER): Payer: Self-pay | Admitting: Family Medicine

## 2023-11-05 ENCOUNTER — Encounter: Payer: Self-pay | Admitting: Family Medicine

## 2023-11-05 VITALS — BP 141/53 | HR 63 | Resp 16 | Ht 72.0 in | Wt 204.5 lb

## 2023-11-05 DIAGNOSIS — Z952 Presence of prosthetic heart valve: Secondary | ICD-10-CM

## 2023-11-05 DIAGNOSIS — I4819 Other persistent atrial fibrillation: Secondary | ICD-10-CM | POA: Diagnosis not present

## 2023-11-05 DIAGNOSIS — D539 Nutritional anemia, unspecified: Secondary | ICD-10-CM | POA: Diagnosis not present

## 2023-11-05 DIAGNOSIS — E222 Syndrome of inappropriate secretion of antidiuretic hormone: Secondary | ICD-10-CM

## 2023-11-05 DIAGNOSIS — F1090 Alcohol use, unspecified, uncomplicated: Secondary | ICD-10-CM | POA: Insufficient documentation

## 2023-11-05 DIAGNOSIS — I5189 Other ill-defined heart diseases: Secondary | ICD-10-CM | POA: Diagnosis not present

## 2023-11-05 NOTE — Progress Notes (Signed)
 Will set up AWV and no  to vaccines

## 2023-11-05 NOTE — Progress Notes (Signed)
 Established patient visit   Patient: Isaac Hancock   DOB: Jun 06, 1956   68 y.o. Male  MRN: [id number] Visit Date: 11/05/2023  Today's healthcare provider: Jeralene Mom, MD   Chief Complaint  Patient presents with   Follow-up    4 month f/u .Aaron Aasno other concerns   Subjective    HPI  Follow up hyponatremia, hypertension, and a-fib. Hyponatremia work up was c/with SIADH. Is off of furosemide . Also noted to have anemia with increasing macrocytosis on last labs. States he has a couple of alcohol drinks most days.   Lab Results  Component Value Date   WBC 4.4 08/25/2023   HGB 10.4 (L) 08/25/2023   HCT 31.4 (L) 08/25/2023   MCV 106 (H) 08/25/2023   PLT 153 08/25/2023   Lab Results  Component Value Date   NA 130 (L) 08/25/2023   K 4.3 08/25/2023   CREATININE 1.09 08/25/2023   EGFR 75 08/25/2023   GLUCOSE 110 (H) 08/25/2023     Medications: Outpatient Medications Prior to Visit  Medication Sig   ALPRAZolam  (XANAX ) 0.5 MG tablet TAKE 1 TABLET(0.5 MG) BY MOUTH THREE TIMES DAILY AS NEEDED FOR ANXIETY   amLODipine  (NORVASC ) 10 MG tablet Take 0.5 tablets (5 mg total) by mouth daily.   atorvastatin  (LIPITOR) 80 MG tablet TAKE 1 TABLET DAILY   colchicine  0.6 MG tablet TAKE 2 TABLET ON FIRST DAY GOUT FLARE, THEN TAKE 1 TABLET DAILY AS NEEDED. REDUCE ATORVASTATIN  TO 1/2 DAILY ON DAYS YOU TAKE COLCHICINE    cyclobenzaprine  (FLEXERIL ) 10 MG tablet Take 1 tablet (10 mg total) by mouth 3 (three) times daily as needed for muscle spasms.   enalapril (VASOTEC) 20 MG tablet TAKE 1 TABLET TWICE A DAY   fexofenadine (ALLEGRA) 60 MG tablet Take 60 mg by mouth 2 (two) times daily.    HYDROcodone -acetaminophen  (NORCO/VICODIN) 5-325 MG tablet Take 1-2 tablets by mouth every 6 (six) hours as needed for moderate pain (pain score 4-6).   icosapent  Ethyl (VASCEPA ) 1 g capsule Take 2 capsules (2 g total) by mouth 2 (two) times daily. TAKE IN PLACE OF LOVAZA  (OMEGA-3 ETHYL ESTERS)   metoprolol   (TOPROL -XL) 200 MG 24 hr tablet TAKE 1 TABLET DAILY (NEED TO SCHEDULE OFFICE VISIT FOR FOLLOW UP)   MULTIPLE VITAMIN PO Take 1 tablet by mouth daily.    NON FORMULARY    omeprazole  (PRILOSEC) 20 MG capsule TAKE 1 CAPSULE DAILY   sildenafil  (VIAGRA ) 50 MG tablet Take 1 tablet (50 mg total) by mouth daily as needed for erectile dysfunction.   Vitamin D , Ergocalciferol , (DRISDOL) 1.25 MG (50000 UNIT) CAPS capsule TAKE 1 CAPSULE ONCE WEEKLY   warfarin (COUMADIN ) 1 MG tablet Take 1 tablet (1 mg total) by mouth daily. (Patient taking differently: Take 0.5 mg by mouth daily.)   warfarin (COUMADIN ) 2 MG tablet TAKE 1 TABLET(2 MG) BY MOUTH DAILY   warfarin (COUMADIN ) 3 MG tablet Take 3 mg by mouth.   warfarin (COUMADIN ) 4 MG tablet Take 4 mg by mouth.   warfarin (COUMADIN ) 6 MG tablet TAKE 1 TABLET BY MOUTH EVERY DAY AS DIRECTED   No facility-administered medications prior to visit.    Review of Systems  Constitutional:  Negative for appetite change, chills and fever.  Respiratory:  Negative for chest tightness, shortness of breath and wheezing.   Cardiovascular:  Negative for chest pain and palpitations.  Gastrointestinal:  Negative for abdominal pain, nausea and vomiting.       Objective  BP (!) 141/53 (BP Location: Right Arm, Patient Position: Sitting, Cuff Size: Normal)   Pulse 63   Resp 16   Ht 6' (1.829 m)   Wt 204 lb 8 oz (92.8 kg)   SpO2 100%   BMI 27.74 kg/m    Physical Exam   General: Appearance:    Well developed, well nourished male in no acute distress  Eyes:    PERRL, conjunctiva/corneas clear, EOM's intact       Lungs:     Clear to auscultation bilaterally, respirations unlabored  Heart:    Normal heart rate. Irregularly irregular rhythm.  A metallic click heard   MS:   All extremities are intact.  1+ LLE edema. Extensive LLE varicosities.   Neurologic:   Awake, alert, oriented x 3. No apparent focal neurological defect.         Assessment & Plan     1. SIADH  (syndrome of inappropriate ADH production) (HCC) (Primary) Off diuretics.  - Renal function panel  2. Macrocytic anemia  - CBC - B12 and Folate Panel  3. Diastolic dysfunction   4. Persistent atrial fibrillation (HCC)  - Protime-INR   Has appointment with Dr. Meredeth Stallion tomorrow morning. Will send copy labs to him.         Jeralene Mom, MD  [facility] [phone] (phone) [phone] (fax)  [facility]

## 2023-11-06 ENCOUNTER — Ambulatory Visit (INDEPENDENT_AMBULATORY_CARE_PROVIDER_SITE_OTHER): Payer: BC Managed Care – PPO | Admitting: Cardiovascular Disease

## 2023-11-06 ENCOUNTER — Encounter: Payer: Self-pay | Admitting: Family Medicine

## 2023-11-06 ENCOUNTER — Encounter: Payer: Self-pay | Admitting: Cardiovascular Disease

## 2023-11-06 VITALS — BP 126/60 | HR 78 | Ht 72.0 in | Wt 202.0 lb

## 2023-11-06 DIAGNOSIS — R609 Edema, unspecified: Secondary | ICD-10-CM

## 2023-11-06 DIAGNOSIS — E782 Mixed hyperlipidemia: Secondary | ICD-10-CM

## 2023-11-06 DIAGNOSIS — I4819 Other persistent atrial fibrillation: Secondary | ICD-10-CM | POA: Diagnosis not present

## 2023-11-06 DIAGNOSIS — I10 Essential (primary) hypertension: Secondary | ICD-10-CM | POA: Diagnosis not present

## 2023-11-06 DIAGNOSIS — D696 Thrombocytopenia, unspecified: Secondary | ICD-10-CM | POA: Insufficient documentation

## 2023-11-06 DIAGNOSIS — G4733 Obstructive sleep apnea (adult) (pediatric): Secondary | ICD-10-CM | POA: Diagnosis not present

## 2023-11-06 DIAGNOSIS — Z952 Presence of prosthetic heart valve: Secondary | ICD-10-CM

## 2023-11-06 LAB — RENAL FUNCTION PANEL
Albumin: 4.4 g/dL (ref 3.9–4.9)
BUN/Creatinine Ratio: 13 (ref 10–24)
BUN: 13 mg/dL (ref 8–27)
CO2: 24 mmol/L (ref 20–29)
Calcium: 9 mg/dL (ref 8.6–10.2)
Chloride: 95 mmol/L — ABNORMAL LOW (ref 96–106)
Creatinine, Ser: 0.98 mg/dL (ref 0.76–1.27)
Glucose: 108 mg/dL — ABNORMAL HIGH (ref 70–99)
Phosphorus: 3.7 mg/dL (ref 2.8–4.1)
Potassium: 4.3 mmol/L (ref 3.5–5.2)
Sodium: 133 mmol/L — ABNORMAL LOW (ref 134–144)
eGFR: 86 mL/min/{1.73_m2} (ref >59)

## 2023-11-06 LAB — CBC
Hematocrit: 33.2 % — ABNORMAL LOW (ref 37.5–51.0)
Hemoglobin: 11.1 g/dL — ABNORMAL LOW (ref 13.0–17.7)
MCH: 35.1 pg — ABNORMAL HIGH (ref 26.6–33.0)
MCHC: 33.4 g/dL (ref 31.5–35.7)
MCV: 105 fL — ABNORMAL HIGH (ref 79–97)
Platelets: 113 10*3/uL — ABNORMAL LOW (ref 150–450)
RBC: 3.16 x10E6/uL — ABNORMAL LOW (ref 4.14–5.80)
RDW: 13.5 % (ref 11.6–15.4)
WBC: 5.2 10*3/uL (ref 3.4–10.8)

## 2023-11-06 LAB — PROTIME-INR
INR: 2.2 — ABNORMAL HIGH (ref 0.9–1.2)
Prothrombin Time: 23.2 s — ABNORMAL HIGH (ref 9.1–12.0)

## 2023-11-06 LAB — B12 AND FOLATE PANEL
Folate: 3.6 ng/mL (ref >3.0)
Vitamin B-12: 954 pg/mL (ref 232–1245)

## 2023-11-06 NOTE — Progress Notes (Signed)
 Cardiology Office Note   Date:  11/06/2023   ID:  DUSTIN BUMBAUGH, DOB July 07, 1956, MRN [id number]  PCP:  Lamon Pillow, MD  Cardiologist:  Debborah Fairly, MD      History of Present Illness: Isaac Hancock is a 68 y.o. male who presents for  Chief Complaint  Patient presents with   Follow-up    3 Months Follow Up    No chest pain or SOB      Past Medical History:  Diagnosis Date   History of chicken pox    History of measles    History of mumps      Past Surgical History:  Procedure Laterality Date   AORTIC VALVE REPLACEMENT  08/2009   Mechanical; [facility]. [facility]. discharged on Coumadin  and Toprol  XL   CARDIAC CATHETERIZATION  08/2009   Normal Coronary Arteries, Normal EF; Severe aortic stenosis   COLONOSCOPY  12/2010   Normal; symptoms: OC Light +. Iftikhar   DOPPLER ECHOCARDIOGRAPHY  02/18/2012   normal EF. Done by Dr. Meredeth Stallion for follow AV repair   ETT  2005   Cardiolite 07/2009: inferior- leteral ischemia   FOOT SURGERY Left 04/16/2013   HERNIA REPAIR  06/2011   UMBILICAL     Current Outpatient Medications  Medication Sig Dispense Refill   ALPRAZolam  (XANAX ) 0.5 MG tablet TAKE 1 TABLET(0.5 MG) BY MOUTH THREE TIMES DAILY AS NEEDED FOR ANXIETY 60 tablet 5   amLODipine  (NORVASC ) 10 MG tablet Take 0.5 tablets (5 mg total) by mouth daily.     atorvastatin  (LIPITOR) 80 MG tablet TAKE 1 TABLET DAILY 90 tablet 3   colchicine  0.6 MG tablet TAKE 2 TABLET ON FIRST DAY GOUT FLARE, THEN TAKE 1 TABLET DAILY AS NEEDED. REDUCE ATORVASTATIN  TO 1/2 DAILY ON DAYS YOU TAKE COLCHICINE  20 tablet 5   cyclobenzaprine  (FLEXERIL ) 10 MG tablet Take 1 tablet (10 mg total) by mouth 3 (three) times daily as needed for muscle spasms. 30 tablet 0   enalapril (VASOTEC) 20 MG tablet TAKE 1 TABLET TWICE A DAY 180 tablet 3   fexofenadine (ALLEGRA) 60 MG tablet Take 60 mg by mouth 2 (two) times daily.      HYDROcodone -acetaminophen  (NORCO/VICODIN) 5-325 MG tablet Take 1-2 tablets by mouth  every 6 (six) hours as needed for moderate pain (pain score 4-6). 60 tablet 0   icosapent  Ethyl (VASCEPA ) 1 g capsule Take 2 capsules (2 g total) by mouth 2 (two) times daily. TAKE IN PLACE OF LOVAZA  (OMEGA-3 ETHYL ESTERS) 360 capsule 3   metoprolol  (TOPROL -XL) 200 MG 24 hr tablet TAKE 1 TABLET DAILY (NEED TO SCHEDULE OFFICE VISIT FOR FOLLOW UP) 90 tablet 3   MULTIPLE VITAMIN PO Take 1 tablet by mouth daily.      NON FORMULARY      omeprazole  (PRILOSEC) 20 MG capsule TAKE 1 CAPSULE DAILY 90 capsule 3   sildenafil  (VIAGRA ) 50 MG tablet Take 1 tablet (50 mg total) by mouth daily as needed for erectile dysfunction. 30 tablet 1   Vitamin D , Ergocalciferol , (DRISDOL) 1.25 MG (50000 UNIT) CAPS capsule TAKE 1 CAPSULE ONCE WEEKLY 12 capsule 4   warfarin (COUMADIN ) 1 MG tablet Take 1 tablet (1 mg total) by mouth daily. (Patient taking differently: Take 0.5 mg by mouth daily.) 90 tablet 3   warfarin (COUMADIN ) 2 MG tablet TAKE 1 TABLET(2 MG) BY MOUTH DAILY 30 tablet 11   warfarin (COUMADIN ) 3 MG tablet Take 3 mg by mouth.     warfarin (COUMADIN )  4 MG tablet Take 4 mg by mouth.     warfarin (COUMADIN ) 6 MG tablet TAKE 1 TABLET BY MOUTH EVERY DAY AS DIRECTED 30 tablet 0   No current facility-administered medications for this visit.    Allergies:   Crestor  [rosuvastatin  calcium ]    Social History:   reports that he has never smoked. He has quit using smokeless tobacco.  His smokeless tobacco use included snuff and chew. He reports current alcohol use. He reports that he does not use drugs.   Family History:  family history includes Hypertension in his mother.    ROS:     Review of Systems  Constitutional: Negative.   HENT: Negative.    Eyes: Negative.   Respiratory: Negative.    Gastrointestinal: Negative.   Genitourinary: Negative.   Musculoskeletal: Negative.   Skin: Negative.   Neurological: Negative.   Endo/Heme/Allergies: Negative.   Psychiatric/Behavioral: Negative.    All other  systems reviewed and are negative.     All other systems are reviewed and negative.    PHYSICAL EXAM: VS:  BP 126/60   Pulse 78   Ht 6' (1.829 m)   Wt 202 lb (91.6 kg)   SpO2 98%   BMI 27.40 kg/m  , BMI Body mass index is 27.4 kg/m. Last weight:  Wt Readings from Last 3 Encounters:  11/06/23 202 lb (91.6 kg)  11/05/23 204 lb 8 oz (92.8 kg)  08/27/23 204 lb 11.2 oz (92.9 kg)     Physical Exam Vitals reviewed.  Constitutional:      Appearance: Normal appearance. He is normal weight.  HENT:     Head: Normocephalic.     Nose: Nose normal.     Mouth/Throat:     Mouth: Mucous membranes are moist.  Eyes:     Pupils: Pupils are equal, round, and reactive to light.  Cardiovascular:     Rate and Rhythm: Normal rate and regular rhythm.     Pulses: Normal pulses.     Heart sounds: Normal heart sounds.  Pulmonary:     Effort: Pulmonary effort is normal.  Abdominal:     General: Abdomen is flat. Bowel sounds are normal.  Musculoskeletal:        General: Normal range of motion.     Cervical back: Normal range of motion.  Skin:    General: Skin is warm.  Neurological:     General: No focal deficit present.     Mental Status: He is alert.  Psychiatric:        Mood and Affect: Mood normal.       EKG:   Recent Labs: 08/25/2023: ALT 20; TSH 2.560 11/05/2023: BUN 13; Creatinine, Ser 0.98; Hemoglobin 11.1; Platelets 113; Potassium 4.3; Sodium 133    Lipid Panel    Component Value Date/Time   CHOL 160 07/04/2023 1039   CHOL 252 (H) 12/01/2013 1734   TRIG 93 07/04/2023 1039   TRIG 68 12/01/2013 1734   HDL 76 07/04/2023 1039   HDL 85 (H) 12/01/2013 1734   CHOLHDL 2.1 07/04/2023 1039   CHOLHDL 7.1 (H) 05/29/2017 0828   VLDL 14 12/01/2013 1734   LDLCALC 67 07/04/2023 1039   LDLCALC  05/29/2017 0828     Comment:     . LDL cholesterol not calculated. Triglyceride levels greater than 400 mg/dL invalidate calculated LDL results. . Reference range: <100 . Desirable  range <100 mg/dL for primary prevention;   <70 mg/dL for patients with CHD or diabetic patients  with > or = 2 CHD risk factors. Aaron Aas LDL-C is now calculated using the Martin-Hopkins  calculation, which is a validated novel method providing  better accuracy than the Friedewald equation in the  estimation of LDL-C.  Melinda Sprawls et al. Erroll Heard. 7829;562(13): 2061-2068  (http://education.[facility].com/faq/FAQ164)    LDLCALC 153 (H) 12/01/2013 1734      Other studies Reviewed: Additional studies/ records that were reviewed today include:  Review of the above records demonstrates:       No data to display            ASSESSMENT AND PLAN:    ICD-10-CM   1. OSA on CPAP  G47.33     2. Persistent atrial fibrillation (HCC)  I48.19     3. Primary hypertension  I10     4. Hyperlipidemia, mixed  E78.2     5. H/O aortic valve replacement  Z95.2    INE 2.2 on coumadin  3 mg alternating with 4.5    6. Edema, unspecified type  R60.9        Problem List Items Addressed This Visit       Cardiovascular and Mediastinum   Primary hypertension   Persistent atrial fibrillation (HCC)     Respiratory   OSA on CPAP - Primary     Other   Hyperlipidemia, mixed   H/O aortic valve replacement   Other Visit Diagnoses       Edema, unspecified type              Disposition:   Return in about 4 months (around 03/07/2024).    Total time spent: 35 minutes  Signed,  Debborah Fairly, MD  11/06/2023 9:43 AM    [facility]

## 2023-11-07 ENCOUNTER — Other Ambulatory Visit: Payer: Self-pay | Admitting: Family Medicine

## 2023-11-07 DIAGNOSIS — M51379 Other intervertebral disc degeneration, lumbosacral region without mention of lumbar back pain or lower extremity pain: Secondary | ICD-10-CM

## 2023-11-10 ENCOUNTER — Encounter: Payer: Self-pay | Admitting: Family Medicine

## 2023-11-10 ENCOUNTER — Other Ambulatory Visit: Payer: Self-pay | Admitting: Family Medicine

## 2023-11-10 DIAGNOSIS — M51379 Other intervertebral disc degeneration, lumbosacral region without mention of lumbar back pain or lower extremity pain: Secondary | ICD-10-CM

## 2023-11-11 MED ORDER — HYDROCODONE-ACETAMINOPHEN 5-325 MG PO TABS: 1.0000 | ORAL_TABLET | Freq: Four times a day (QID) | ORAL | 0 refills | Status: DC | PRN

## 2023-11-19 ENCOUNTER — Other Ambulatory Visit: Payer: Self-pay | Admitting: Family Medicine

## 2023-11-19 DIAGNOSIS — M51379 Other intervertebral disc degeneration, lumbosacral region without mention of lumbar back pain or lower extremity pain: Secondary | ICD-10-CM

## 2023-11-20 ENCOUNTER — Other Ambulatory Visit: Payer: Self-pay | Admitting: Family Medicine

## 2023-11-20 DIAGNOSIS — M51379 Other intervertebral disc degeneration, lumbosacral region without mention of lumbar back pain or lower extremity pain: Secondary | ICD-10-CM

## 2023-11-21 ENCOUNTER — Other Ambulatory Visit: Payer: Self-pay | Admitting: Family Medicine

## 2023-11-21 DIAGNOSIS — M51379 Other intervertebral disc degeneration, lumbosacral region without mention of lumbar back pain or lower extremity pain: Secondary | ICD-10-CM

## 2023-11-21 MED ORDER — HYDROCODONE-ACETAMINOPHEN 5-325 MG PO TABS: 1.0000 | ORAL_TABLET | Freq: Four times a day (QID) | ORAL | 0 refills | Status: DC | PRN

## 2023-12-03 ENCOUNTER — Encounter: Payer: Self-pay | Admitting: Family Medicine

## 2023-12-03 DIAGNOSIS — M51379 Other intervertebral disc degeneration, lumbosacral region without mention of lumbar back pain or lower extremity pain: Secondary | ICD-10-CM

## 2023-12-03 MED ORDER — HYDROCODONE-ACETAMINOPHEN 5-325 MG PO TABS: 1.0000 | ORAL_TABLET | Freq: Four times a day (QID) | ORAL | 0 refills | Status: DC | PRN

## 2023-12-07 ENCOUNTER — Other Ambulatory Visit: Payer: Self-pay | Admitting: Family Medicine

## 2023-12-15 ENCOUNTER — Encounter: Payer: Self-pay | Admitting: Cardiovascular Disease

## 2023-12-15 ENCOUNTER — Other Ambulatory Visit: Payer: Self-pay

## 2023-12-16 MED ORDER — WARFARIN SODIUM 4 MG PO TABS: 4.0000 mg | ORAL_TABLET | Freq: Every day | ORAL | 0 refills | Status: DC

## 2023-12-16 MED ORDER — WARFARIN SODIUM 1 MG PO TABS: 1.0000 mg | ORAL_TABLET | Freq: Every day | ORAL | 3 refills | Status: DC

## 2023-12-28 ENCOUNTER — Encounter: Payer: Self-pay | Admitting: Family Medicine

## 2023-12-29 ENCOUNTER — Other Ambulatory Visit: Payer: Self-pay

## 2023-12-29 DIAGNOSIS — M51379 Other intervertebral disc degeneration, lumbosacral region without mention of lumbar back pain or lower extremity pain: Secondary | ICD-10-CM

## 2023-12-29 MED ORDER — HYDROCODONE-ACETAMINOPHEN 5-325 MG PO TABS: 1.0000 | ORAL_TABLET | Freq: Four times a day (QID) | ORAL | 0 refills | Status: DC | PRN

## 2024-01-04 ENCOUNTER — Other Ambulatory Visit: Payer: Self-pay | Admitting: Family Medicine

## 2024-01-09 ENCOUNTER — Other Ambulatory Visit: Payer: Self-pay

## 2024-01-09 ENCOUNTER — Encounter: Payer: Self-pay | Admitting: Family Medicine

## 2024-01-09 DIAGNOSIS — M51379 Other intervertebral disc degeneration, lumbosacral region without mention of lumbar back pain or lower extremity pain: Secondary | ICD-10-CM

## 2024-01-10 ENCOUNTER — Encounter: Payer: Self-pay | Admitting: Family Medicine

## 2024-01-11 MED ORDER — HYDROCODONE-ACETAMINOPHEN 5-325 MG PO TABS: 1.0000 | ORAL_TABLET | Freq: Four times a day (QID) | ORAL | 0 refills | Status: DC | PRN

## 2024-01-22 ENCOUNTER — Encounter: Payer: Self-pay | Admitting: Family Medicine

## 2024-01-22 DIAGNOSIS — M51379 Other intervertebral disc degeneration, lumbosacral region without mention of lumbar back pain or lower extremity pain: Secondary | ICD-10-CM

## 2024-01-24 ENCOUNTER — Encounter: Payer: Self-pay | Admitting: Family Medicine

## 2024-01-26 MED ORDER — HYDROCODONE-ACETAMINOPHEN 5-325 MG PO TABS: 1.0000 | ORAL_TABLET | Freq: Four times a day (QID) | ORAL | 0 refills | Status: DC | PRN

## 2024-02-06 ENCOUNTER — Encounter: Payer: Self-pay | Admitting: Family Medicine

## 2024-02-06 DIAGNOSIS — M51379 Other intervertebral disc degeneration, lumbosacral region without mention of lumbar back pain or lower extremity pain: Secondary | ICD-10-CM

## 2024-02-10 ENCOUNTER — Encounter: Payer: Self-pay | Admitting: Family Medicine

## 2024-02-10 DIAGNOSIS — M51379 Other intervertebral disc degeneration, lumbosacral region without mention of lumbar back pain or lower extremity pain: Secondary | ICD-10-CM

## 2024-02-10 NOTE — Telephone Encounter (Signed)
 LOV 11/05/2023 NOV 03/05/2024 LRF 10/30/2023 60 tablet (8 day supply)

## 2024-02-11 MED ORDER — HYDROCODONE-ACETAMINOPHEN 5-325 MG PO TABS: 1.0000 | ORAL_TABLET | Freq: Four times a day (QID) | ORAL | 0 refills | Status: DC | PRN

## 2024-02-23 ENCOUNTER — Encounter: Payer: Self-pay | Admitting: Family Medicine

## 2024-02-24 ENCOUNTER — Other Ambulatory Visit: Payer: Self-pay

## 2024-02-24 DIAGNOSIS — M51379 Other intervertebral disc degeneration, lumbosacral region without mention of lumbar back pain or lower extremity pain: Secondary | ICD-10-CM

## 2024-02-25 MED ORDER — HYDROCODONE-ACETAMINOPHEN 5-325 MG PO TABS: 1.0000 | ORAL_TABLET | Freq: Four times a day (QID) | ORAL | 0 refills | Status: DC | PRN

## 2024-03-05 ENCOUNTER — Ambulatory Visit (INDEPENDENT_AMBULATORY_CARE_PROVIDER_SITE_OTHER): Admitting: Family Medicine

## 2024-03-05 ENCOUNTER — Encounter: Payer: Self-pay | Admitting: Family Medicine

## 2024-03-05 VITALS — BP 151/67 | HR 62 | Ht 72.0 in | Wt 202.0 lb

## 2024-03-05 DIAGNOSIS — M545 Low back pain, unspecified: Secondary | ICD-10-CM

## 2024-03-05 DIAGNOSIS — I4819 Other persistent atrial fibrillation: Secondary | ICD-10-CM

## 2024-03-05 DIAGNOSIS — E222 Syndrome of inappropriate secretion of antidiuretic hormone: Secondary | ICD-10-CM

## 2024-03-05 DIAGNOSIS — Z1211 Encounter for screening for malignant neoplasm of colon: Secondary | ICD-10-CM | POA: Diagnosis not present

## 2024-03-05 DIAGNOSIS — F1190 Opioid use, unspecified, uncomplicated: Secondary | ICD-10-CM

## 2024-03-05 DIAGNOSIS — G8929 Other chronic pain: Secondary | ICD-10-CM

## 2024-03-05 DIAGNOSIS — I10 Essential (primary) hypertension: Secondary | ICD-10-CM

## 2024-03-05 DIAGNOSIS — M5412 Radiculopathy, cervical region: Secondary | ICD-10-CM

## 2024-03-05 DIAGNOSIS — D539 Nutritional anemia, unspecified: Secondary | ICD-10-CM

## 2024-03-05 DIAGNOSIS — Z125 Encounter for screening for malignant neoplasm of prostate: Secondary | ICD-10-CM | POA: Diagnosis not present

## 2024-03-05 NOTE — Progress Notes (Signed)
 Established patient visit   Patient: Isaac Hancock   DOB: [birth year]   68 y.o. Male  MRN: [id number] Visit Date: 03/05/2024  Today's healthcare provider: Nancyann Perry, MD   Chief Complaint  Patient presents with   Follow-up    No concerns    Subjective    Discussed the use of AI scribe software for clinical note transcription with the patient, who gave verbal consent to proceed.  History of Present Illness   Isaac Hancock is a 67 year old male with atrial fibrillation and hypertension who presents for follow-up of hyponatremia and macrocytic anemia.  He has a history of hyponatremia, with the last sodium level recorded at 133 mEq/L on April 23rd. Previously, his sodium level dropped to 120 mEq/L in January, attributed to SIADH. Monitoring of sodium levels is ongoing.  Previously on frequent prednisone  burst and tapers due to chronic back pain which was thought to contribute to hyponatremia. pain currently managed with oxycodone/apap which he mostly takes on a schedule.   He experiences numbness in his left arm that started about three weeks ago. The numbness extends from his thumb down the arm and is intermittent. He describes it as his arm 'going to sleep' but states it does not interfere with his ability to use the arm. No neck pain is present.  He consumes alcohol regularly, having a couple of drinks almost every day.     Lab Results  Component Value Date   CHOL 160 07/04/2023   HDL 76 07/04/2023   LDLCALC 67 07/04/2023   TRIG 93 07/04/2023   CHOLHDL 2.1 07/04/2023   Lab Results  Component Value Date   WBC 5.2 11/05/2023   HGB 11.1 (L) 11/05/2023   HCT 33.2 (L) 11/05/2023   MCV 105 (H) 11/05/2023   PLT 113 (L) 11/05/2023   Last vitamin B12 and Folate Lab Results  Component Value Date   VITAMINB12 954 11/05/2023   FOLATE 3.6 11/05/2023   BP Readings from Last 3 Encounters:  03/05/24 (!) 151/67  11/06/23 126/60  11/05/23 (!) 141/53      Medications: Outpatient Medications Prior to Visit  Medication Sig   ALPRAZolam  (XANAX ) 0.5 MG tablet TAKE 1 TABLET(0.5 MG) BY MOUTH THREE TIMES DAILY AS NEEDED FOR ANXIETY   amLODipine  (NORVASC ) 10 MG tablet Take 0.5 tablets (5 mg total) by mouth daily.   atorvastatin  (LIPITOR) 80 MG tablet TAKE 1 TABLET DAILY   colchicine  0.6 MG tablet TAKE 2 TABLET ON FIRST DAY GOUT FLARE, THEN TAKE 1 TABLET DAILY AS NEEDED. REDUCE ATORVASTATIN  TO 1/2 DAILY ON DAYS YOU TAKE COLCHICINE    cyclobenzaprine  (FLEXERIL ) 10 MG tablet Take 1 tablet (10 mg total) by mouth 3 (three) times daily as needed for muscle spasms.   enalapril (VASOTEC) 20 MG tablet TAKE 1 TABLET TWICE A DAY   fexofenadine (ALLEGRA) 60 MG tablet Take 60 mg by mouth 2 (two) times daily.    HYDROcodone -acetaminophen  (NORCO/VICODIN) 5-325 MG tablet Take 1-2 tablets by mouth every 6 (six) hours as needed for moderate pain (pain score 4-6).   icosapent  Ethyl (VASCEPA ) 1 g capsule Take 2 capsules (2 g total) by mouth 2 (two) times daily. TAKE IN PLACE OF LOVAZA  (OMEGA-3 ETHYL ESTERS)   metoprolol  (TOPROL -XL) 200 MG 24 hr tablet TAKE 1 TABLET DAILY (NEED TO SCHEDULE OFFICE VISIT FOR FOLLOW UP)   MULTIPLE VITAMIN PO Take 1 tablet by mouth daily.    NON FORMULARY    omeprazole  (PRILOSEC) 20  MG capsule TAKE 1 CAPSULE DAILY   sildenafil  (VIAGRA ) 50 MG tablet TAKE 1 TABLET DAILY AS NEEDED FOR ERECTILE DYSFUNCTION   Vitamin D , Ergocalciferol , (DRISDOL) 1.25 MG (50000 UNIT) CAPS capsule TAKE 1 CAPSULE ONCE WEEKLY   warfarin (COUMADIN ) 1 MG tablet Take 1 tablet (1 mg total) by mouth daily.   warfarin (COUMADIN ) 2 MG tablet TAKE 1 TABLET(2 MG) BY MOUTH DAILY   warfarin (COUMADIN ) 3 MG tablet Take 3 mg by mouth.   warfarin (COUMADIN ) 4 MG tablet Take 1 tablet (4 mg total) by mouth daily.   warfarin (COUMADIN ) 6 MG tablet TAKE 1 TABLET BY MOUTH EVERY DAY AS DIRECTED   No facility-administered medications prior to visit.        Objective    BP  (!) 151/67   Pulse 62   Ht 6' (1.829 m)   Wt 202 lb (91.6 kg)   SpO2 98%   BMI 27.40 kg/m   Physical Exam   General: Appearance:    Well developed, well nourished male in no acute distress  Eyes:    PERRL, conjunctiva/corneas clear, EOM's intact       Lungs:     Clear to auscultation bilaterally, respirations unlabored  Heart:    Normal heart rate. Irregularly irregular rhythm. No murmurs, rubs, or gallops.  A metallic click heard   MS:   All extremities are intact.    Neurologic:   Awake, alert, oriented x 3. No apparent focal neurological defect.          Assessment & Plan    1. Primary hypertension (Primary) BP up a bit today. Usually better controlled. Continue current medications.    2. SIADH (syndrome of inappropriate ADH production) (HCC) May have been related to frequent prednisone  burst and tapers for treatment of back pain.  - Renal function panel  3. Macrocytic anemia  - CBC  4. Persistent atrial fibrillation (HCC) Managed by Dr. Fernand. Will forward labs to him.  - INR/PT  5. Cervical radiculopathy at C6  - DG Cervical Spine Complete; Future  6. Chronic midline low back pain, unspecified whether sciatica present Well controlled on current hydrocodone /apap regiment.   7. Chronic, continuous use of opioids No sign of abuse or diversion.   8. Prostate cancer screening  - PSA Total (Reflex To Free)  9. Colon cancer screening  - Cologuard      Nancyann Perry, MD  [facility] [phone] (phone) [phone] (fax)  [facility]

## 2024-03-05 NOTE — Patient Instructions (Signed)
.   Please review the attached list of medications and notify my office if there are any errors.   Go to the [facility] on [facility] for neck Xray

## 2024-03-06 LAB — CBC
Hematocrit: 34.1 % — ABNORMAL LOW (ref 37.5–51.0)
Hemoglobin: 11.6 g/dL — ABNORMAL LOW (ref 13.0–17.7)
MCH: 36.7 pg — ABNORMAL HIGH (ref 26.6–33.0)
MCHC: 34 g/dL (ref 31.5–35.7)
MCV: 108 fL — ABNORMAL HIGH (ref 79–97)
Platelets: 108 x10E3/uL — ABNORMAL LOW (ref 150–450)
RBC: 3.16 x10E6/uL — ABNORMAL LOW (ref 4.14–5.80)
RDW: 13.4 % (ref 11.6–15.4)
WBC: 4.4 x10E3/uL (ref 3.4–10.8)

## 2024-03-06 LAB — PSA TOTAL (REFLEX TO FREE): Prostate Specific Ag, Serum: 1 ng/mL (ref 0.0–4.0)

## 2024-03-06 LAB — RENAL FUNCTION PANEL
Albumin: 4.7 g/dL (ref 3.9–4.9)
BUN/Creatinine Ratio: 11 (ref 10–24)
BUN: 10 mg/dL (ref 8–27)
CO2: 25 mmol/L (ref 20–29)
Calcium: 9 mg/dL (ref 8.6–10.2)
Chloride: 94 mmol/L — ABNORMAL LOW (ref 96–106)
Creatinine, Ser: 0.92 mg/dL (ref 0.76–1.27)
Glucose: 100 mg/dL — ABNORMAL HIGH (ref 70–99)
Phosphorus: 4 mg/dL (ref 2.8–4.1)
Potassium: 4.1 mmol/L (ref 3.5–5.2)
Sodium: 135 mmol/L (ref 134–144)
eGFR: 92 mL/min/1.73 (ref >59)

## 2024-03-06 LAB — PROTIME-INR
INR: 2.5 — ABNORMAL HIGH (ref 0.9–1.2)
Prothrombin Time: 25.7 s — ABNORMAL HIGH (ref 9.1–12.0)

## 2024-03-08 ENCOUNTER — Encounter: Payer: Self-pay | Admitting: Cardiovascular Disease

## 2024-03-08 ENCOUNTER — Ambulatory Visit: Payer: Self-pay | Admitting: Family Medicine

## 2024-03-08 ENCOUNTER — Encounter: Payer: Self-pay | Admitting: Family Medicine

## 2024-03-08 ENCOUNTER — Ambulatory Visit (INDEPENDENT_AMBULATORY_CARE_PROVIDER_SITE_OTHER): Admitting: Cardiovascular Disease

## 2024-03-08 VITALS — BP 100/62 | HR 75 | Ht 72.0 in | Wt 204.0 lb

## 2024-03-08 DIAGNOSIS — I4819 Other persistent atrial fibrillation: Secondary | ICD-10-CM | POA: Diagnosis not present

## 2024-03-08 DIAGNOSIS — G4733 Obstructive sleep apnea (adult) (pediatric): Secondary | ICD-10-CM

## 2024-03-08 DIAGNOSIS — M51379 Other intervertebral disc degeneration, lumbosacral region without mention of lumbar back pain or lower extremity pain: Secondary | ICD-10-CM

## 2024-03-08 DIAGNOSIS — I10 Essential (primary) hypertension: Secondary | ICD-10-CM | POA: Diagnosis not present

## 2024-03-08 DIAGNOSIS — E782 Mixed hyperlipidemia: Secondary | ICD-10-CM

## 2024-03-08 DIAGNOSIS — Z952 Presence of prosthetic heart valve: Secondary | ICD-10-CM

## 2024-03-08 NOTE — Progress Notes (Signed)
 Cardiology Office Note   Date:  03/08/2024   ID:  Dyan, Creelman 1955-08-31, MRN [id number]  PCP:  Gasper Nancyann BRAVO, MD  Cardiologist:  Denyse Bathe, MD      History of Present Illness: TYRE BEAVER is a 68 y.o. male who presents for  Chief Complaint  Patient presents with   Follow-up    4 month follow up    No SOB.      Past Medical History:  Diagnosis Date   History of chicken pox    History of measles    History of mumps      Past Surgical History:  Procedure Laterality Date   AORTIC VALVE REPLACEMENT  08/2009   Mechanical; [facility]. [facility]. discharged on Coumadin  and Toprol  XL   CARDIAC CATHETERIZATION  08/2009   Normal Coronary Arteries, Normal EF; Severe aortic stenosis   COLONOSCOPY  12/2010   Normal; symptoms: OC Light +. Iftikhar   DOPPLER ECHOCARDIOGRAPHY  02/18/2012   normal EF. Done by Dr. Bathe for follow AV repair   ETT  2005   Cardiolite 07/2009: inferior- leteral ischemia   FOOT SURGERY Left 04/16/2013   HERNIA REPAIR  06/2011   UMBILICAL     Current Outpatient Medications  Medication Sig Dispense Refill   ALPRAZolam  (XANAX ) 0.5 MG tablet TAKE 1 TABLET(0.5 MG) BY MOUTH THREE TIMES DAILY AS NEEDED FOR ANXIETY 60 tablet 5   amLODipine  (NORVASC ) 10 MG tablet Take 0.5 tablets (5 mg total) by mouth daily.     atorvastatin  (LIPITOR) 80 MG tablet TAKE 1 TABLET DAILY 90 tablet 3   colchicine  0.6 MG tablet TAKE 2 TABLET ON FIRST DAY GOUT FLARE, THEN TAKE 1 TABLET DAILY AS NEEDED. REDUCE ATORVASTATIN  TO 1/2 DAILY ON DAYS YOU TAKE COLCHICINE  20 tablet 5   cyclobenzaprine  (FLEXERIL ) 10 MG tablet Take 1 tablet (10 mg total) by mouth 3 (three) times daily as needed for muscle spasms. 30 tablet 0   enalapril (VASOTEC) 20 MG tablet TAKE 1 TABLET TWICE A DAY 180 tablet 3   fexofenadine (ALLEGRA) 60 MG tablet Take 60 mg by mouth 2 (two) times daily.      HYDROcodone -acetaminophen  (NORCO/VICODIN) 5-325 MG tablet Take 1-2 tablets by mouth every 6 (six)  hours as needed for moderate pain (pain score 4-6). 60 tablet 0   icosapent  Ethyl (VASCEPA ) 1 g capsule Take 2 capsules (2 g total) by mouth 2 (two) times daily. TAKE IN PLACE OF LOVAZA  (OMEGA-3 ETHYL ESTERS) 360 capsule 3   metoprolol  (TOPROL -XL) 200 MG 24 hr tablet TAKE 1 TABLET DAILY (NEED TO SCHEDULE OFFICE VISIT FOR FOLLOW UP) 90 tablet 3   MULTIPLE VITAMIN PO Take 1 tablet by mouth daily.      NON FORMULARY      omeprazole  (PRILOSEC) 20 MG capsule TAKE 1 CAPSULE DAILY 90 capsule 1   sildenafil  (VIAGRA ) 50 MG tablet TAKE 1 TABLET DAILY AS NEEDED FOR ERECTILE DYSFUNCTION 24 tablet 3   Vitamin D , Ergocalciferol , (DRISDOL) 1.25 MG (50000 UNIT) CAPS capsule TAKE 1 CAPSULE ONCE WEEKLY 12 capsule 4   warfarin (COUMADIN ) 1 MG tablet Take 1 tablet (1 mg total) by mouth daily. 90 tablet 3   warfarin (COUMADIN ) 2 MG tablet TAKE 1 TABLET(2 MG) BY MOUTH DAILY 30 tablet 11   warfarin (COUMADIN ) 3 MG tablet Take 3 mg by mouth.     warfarin (COUMADIN ) 4 MG tablet Take 1 tablet (4 mg total) by mouth daily. 90 tablet 0  warfarin (COUMADIN ) 6 MG tablet TAKE 1 TABLET BY MOUTH EVERY DAY AS DIRECTED 30 tablet 0   No current facility-administered medications for this visit.    Allergies:   Crestor  [rosuvastatin  calcium ]    Social History:   reports that he has never smoked. He has quit using smokeless tobacco.  His smokeless tobacco use included snuff and chew. He reports current alcohol use. He reports that he does not use drugs.   Family History:  family history includes Hypertension in his mother.    ROS:     Review of Systems  Constitutional: Negative.   HENT: Negative.    Eyes: Negative.   Respiratory: Negative.    Gastrointestinal: Negative.   Genitourinary: Negative.   Musculoskeletal: Negative.   Skin: Negative.   Neurological: Negative.   Endo/Heme/Allergies: Negative.   Psychiatric/Behavioral: Negative.    All other systems reviewed and are negative.     All other systems are  reviewed and negative.    PHYSICAL EXAM: VS:  BP 100/62   Pulse 75   Ht 6' (1.829 m)   Wt 204 lb (92.5 kg)   SpO2 98%   BMI 27.67 kg/m  , BMI Body mass index is 27.67 kg/m. Last weight:  Wt Readings from Last 3 Encounters:  03/08/24 204 lb (92.5 kg)  03/05/24 202 lb (91.6 kg)  11/06/23 202 lb (91.6 kg)     Physical Exam Vitals reviewed.  Constitutional:      Appearance: Normal appearance. He is normal weight.  HENT:     Head: Normocephalic.     Nose: Nose normal.     Mouth/Throat:     Mouth: Mucous membranes are moist.  Eyes:     Pupils: Pupils are equal, round, and reactive to light.  Cardiovascular:     Rate and Rhythm: Normal rate and regular rhythm.     Pulses: Normal pulses.     Heart sounds: Normal heart sounds.  Pulmonary:     Effort: Pulmonary effort is normal.  Abdominal:     General: Abdomen is flat. Bowel sounds are normal.  Musculoskeletal:        General: Normal range of motion.     Cervical back: Normal range of motion.  Skin:    General: Skin is warm.  Neurological:     General: No focal deficit present.     Mental Status: He is alert.  Psychiatric:        Mood and Affect: Mood normal.       EKG:   Recent Labs: 08/25/2023: ALT 20; TSH 2.560 03/05/2024: BUN 10; Creatinine, Ser 0.92; Hemoglobin 11.6; Platelets 108; Potassium 4.1; Sodium 135    Lipid Panel    Component Value Date/Time   CHOL 160 07/04/2023 1039   CHOL 252 (H) 12/01/2013 1734   TRIG 93 07/04/2023 1039   TRIG 68 12/01/2013 1734   HDL 76 07/04/2023 1039   HDL 85 (H) 12/01/2013 1734   CHOLHDL 2.1 07/04/2023 1039   CHOLHDL 7.1 (H) 05/29/2017 0828   VLDL 14 12/01/2013 1734   LDLCALC 67 07/04/2023 1039   LDLCALC  05/29/2017 0828     Comment:     . LDL cholesterol not calculated. Triglyceride levels greater than 400 mg/dL invalidate calculated LDL results. . Reference range: <100 . Desirable range <100 mg/dL for primary prevention;   <70 mg/dL for patients with CHD  or diabetic patients  with > or = 2 CHD risk factors. SABRA LDL-C is now calculated using the Martin-Hopkins  calculation, which is a validated novel method providing  better accuracy than the Friedewald equation in the  estimation of LDL-C.  Gladis APPLETHWAITE et al. SANDREA. 7986;689(80): 2061-2068  (http://education.[employer].com/faq/FAQ164)    LDLCALC 153 (H) 12/01/2013 1734      Other studies Reviewed: Additional studies/ records that were reviewed today include:  Review of the above records demonstrates:       No data to display            ASSESSMENT AND PLAN:    ICD-10-CM   1. Persistent atrial fibrillation (HCC)  I48.19     2. OSA on CPAP  G47.33     3. H/O aortic valve replacement  Z95.2    ECHO unremarkable. INR 2.5.    4. Primary hypertension  I10     5. Hyperlipidemia, mixed  E78.2        Problem List Items Addressed This Visit       Cardiovascular and Mediastinum   Primary hypertension   Persistent atrial fibrillation (HCC) - Primary     Respiratory   OSA on CPAP     Other   Hyperlipidemia, mixed   H/O aortic valve replacement       Disposition:   Return in about 5 months (around 08/08/2024).    Total time spent: 30 minutes  Signed,  Denyse Bathe, MD  03/08/2024 9:26 AM    [facility]

## 2024-03-11 MED ORDER — HYDROCODONE-ACETAMINOPHEN 5-325 MG PO TABS: 1.0000 | ORAL_TABLET | Freq: Four times a day (QID) | ORAL | 0 refills | Status: DC | PRN

## 2024-03-14 ENCOUNTER — Other Ambulatory Visit: Payer: Self-pay | Admitting: Family Medicine

## 2024-03-19 ENCOUNTER — Other Ambulatory Visit: Payer: Self-pay | Admitting: Family Medicine

## 2024-03-19 DIAGNOSIS — F419 Anxiety disorder, unspecified: Secondary | ICD-10-CM

## 2024-03-21 ENCOUNTER — Other Ambulatory Visit: Payer: Self-pay | Admitting: Cardiovascular Disease

## 2024-03-21 DIAGNOSIS — I10 Essential (primary) hypertension: Secondary | ICD-10-CM

## 2024-03-23 ENCOUNTER — Encounter: Payer: Self-pay | Admitting: Family Medicine

## 2024-03-24 ENCOUNTER — Other Ambulatory Visit: Payer: Self-pay

## 2024-03-24 DIAGNOSIS — M51379 Other intervertebral disc degeneration, lumbosacral region without mention of lumbar back pain or lower extremity pain: Secondary | ICD-10-CM

## 2024-03-24 NOTE — Telephone Encounter (Signed)
 Converted into refill request and send it to provider.

## 2024-03-26 MED ORDER — HYDROCODONE-ACETAMINOPHEN 5-325 MG PO TABS: 1.0000 | ORAL_TABLET | Freq: Four times a day (QID) | ORAL | 0 refills | Status: DC | PRN

## 2024-04-04 ENCOUNTER — Other Ambulatory Visit: Payer: Self-pay | Admitting: Cardiovascular Disease

## 2024-04-04 ENCOUNTER — Other Ambulatory Visit: Payer: Self-pay | Admitting: Family Medicine

## 2024-04-04 DIAGNOSIS — E782 Mixed hyperlipidemia: Secondary | ICD-10-CM

## 2024-04-07 ENCOUNTER — Other Ambulatory Visit: Payer: Self-pay

## 2024-04-07 ENCOUNTER — Encounter: Payer: Self-pay | Admitting: Family Medicine

## 2024-04-07 DIAGNOSIS — M51379 Other intervertebral disc degeneration, lumbosacral region without mention of lumbar back pain or lower extremity pain: Secondary | ICD-10-CM

## 2024-04-07 MED ORDER — HYDROCODONE-ACETAMINOPHEN 5-325 MG PO TABS: 1.0000 | ORAL_TABLET | Freq: Four times a day (QID) | ORAL | 0 refills | Status: DC | PRN

## 2024-04-18 ENCOUNTER — Other Ambulatory Visit: Payer: Self-pay | Admitting: Cardiovascular Disease

## 2024-04-18 DIAGNOSIS — I10 Essential (primary) hypertension: Secondary | ICD-10-CM

## 2024-04-19 ENCOUNTER — Encounter: Payer: Self-pay | Admitting: Family Medicine

## 2024-04-20 ENCOUNTER — Other Ambulatory Visit: Payer: Self-pay

## 2024-04-20 DIAGNOSIS — M51379 Other intervertebral disc degeneration, lumbosacral region without mention of lumbar back pain or lower extremity pain: Secondary | ICD-10-CM

## 2024-04-21 MED ORDER — HYDROCODONE-ACETAMINOPHEN 5-325 MG PO TABS
1.0000 | ORAL_TABLET | Freq: Four times a day (QID) | ORAL | 0 refills | Status: DC | PRN
Start: 2024-04-21 — End: 2024-05-03

## 2024-05-03 ENCOUNTER — Encounter: Payer: Self-pay | Admitting: Family Medicine

## 2024-05-03 ENCOUNTER — Other Ambulatory Visit: Payer: Self-pay

## 2024-05-03 DIAGNOSIS — M51379 Other intervertebral disc degeneration, lumbosacral region without mention of lumbar back pain or lower extremity pain: Secondary | ICD-10-CM

## 2024-05-05 MED ORDER — HYDROCODONE-ACETAMINOPHEN 5-325 MG PO TABS
1.0000 | ORAL_TABLET | Freq: Four times a day (QID) | ORAL | 0 refills | Status: DC | PRN
Start: 2024-05-05 — End: 2024-05-19

## 2024-05-09 ENCOUNTER — Other Ambulatory Visit: Payer: Self-pay | Admitting: Family Medicine

## 2024-05-09 DIAGNOSIS — I10 Essential (primary) hypertension: Secondary | ICD-10-CM

## 2024-05-17 ENCOUNTER — Encounter: Payer: Self-pay | Admitting: Family Medicine

## 2024-05-17 DIAGNOSIS — M51379 Other intervertebral disc degeneration, lumbosacral region without mention of lumbar back pain or lower extremity pain: Secondary | ICD-10-CM

## 2024-05-19 MED ORDER — HYDROCODONE-ACETAMINOPHEN 5-325 MG PO TABS
1.0000 | ORAL_TABLET | Freq: Four times a day (QID) | ORAL | 0 refills | Status: DC | PRN
Start: 2024-05-19 — End: 2024-06-04

## 2024-05-31 ENCOUNTER — Encounter: Payer: Self-pay | Admitting: Family Medicine

## 2024-06-03 ENCOUNTER — Encounter: Payer: Self-pay | Admitting: Family Medicine

## 2024-06-03 DIAGNOSIS — M51379 Other intervertebral disc degeneration, lumbosacral region without mention of lumbar back pain or lower extremity pain: Secondary | ICD-10-CM

## 2024-06-04 MED ORDER — HYDROCODONE-ACETAMINOPHEN 5-325 MG PO TABS: 1.0000 | ORAL_TABLET | Freq: Four times a day (QID) | ORAL | 0 refills | Status: DC | PRN

## 2024-06-06 ENCOUNTER — Other Ambulatory Visit: Payer: Self-pay | Admitting: Family Medicine

## 2024-06-15 ENCOUNTER — Encounter: Payer: Self-pay | Admitting: Family Medicine

## 2024-06-15 ENCOUNTER — Telehealth: Payer: Self-pay

## 2024-06-15 NOTE — Telephone Encounter (Signed)
 LOV- 03/05/2024 NOV- 06/21/2024 LRF-06/04/2024 Outpatient Medication Detail   Disp Refills Start End   HYDROcodone -acetaminophen  (NORCO/VICODIN) 5-325 MG tablet 60 tablet 0 06/04/2024 --   Sig - Route: Take 1-2 tablets by mouth every 6 (six) hours as needed for moderate pain (pain score 4-6). - Oral   Sent to pharmacy as: HYDROcodone -acetaminophen  (NORCO/VICODIN) 5-325 MG tablet   Earliest Fill Date: 06/04/2024   E-Prescribing Status: Receipt confirmed by pharmacy (06/04/2024  9:29 AM EST)

## 2024-06-15 NOTE — Telephone Encounter (Signed)
 Converted to rx request

## 2024-06-18 ENCOUNTER — Other Ambulatory Visit: Payer: Self-pay

## 2024-06-18 ENCOUNTER — Encounter: Payer: Self-pay | Admitting: Family Medicine

## 2024-06-18 DIAGNOSIS — M51379 Other intervertebral disc degeneration, lumbosacral region without mention of lumbar back pain or lower extremity pain: Secondary | ICD-10-CM

## 2024-06-18 MED ORDER — HYDROCODONE-ACETAMINOPHEN 5-325 MG PO TABS: 1.0000 | ORAL_TABLET | Freq: Four times a day (QID) | ORAL | 0 refills | Status: DC | PRN

## 2024-06-20 ENCOUNTER — Encounter: Payer: Self-pay | Admitting: Family Medicine

## 2024-06-20 NOTE — Progress Notes (Deleted)
 Fibrosis 4 Score = 5.47 Score is based on outdated labs. ALT, AST, and platelets should all be measured within the last 6 months for an accurate FIB-4 Score  Fib-4 interpretation is not validated for people under 35 or over 66 years of age. However, scores under 2.0 are generally considered low risk.

## 2024-06-21 ENCOUNTER — Ambulatory Visit: Admitting: Family Medicine

## 2024-06-21 DIAGNOSIS — E782 Mixed hyperlipidemia: Secondary | ICD-10-CM

## 2024-06-21 DIAGNOSIS — E559 Vitamin D deficiency, unspecified: Secondary | ICD-10-CM

## 2024-06-21 DIAGNOSIS — F17200 Nicotine dependence, unspecified, uncomplicated: Secondary | ICD-10-CM

## 2024-06-21 DIAGNOSIS — I10 Essential (primary) hypertension: Secondary | ICD-10-CM

## 2024-06-21 DIAGNOSIS — D696 Thrombocytopenia, unspecified: Secondary | ICD-10-CM

## 2024-06-21 DIAGNOSIS — F1190 Opioid use, unspecified, uncomplicated: Secondary | ICD-10-CM

## 2024-06-21 DIAGNOSIS — G4733 Obstructive sleep apnea (adult) (pediatric): Secondary | ICD-10-CM

## 2024-06-30 ENCOUNTER — Encounter: Payer: Self-pay | Admitting: Family Medicine

## 2024-06-30 ENCOUNTER — Other Ambulatory Visit: Payer: Self-pay

## 2024-06-30 DIAGNOSIS — M51379 Other intervertebral disc degeneration, lumbosacral region without mention of lumbar back pain or lower extremity pain: Secondary | ICD-10-CM

## 2024-07-01 MED ORDER — HYDROCODONE-ACETAMINOPHEN 5-325 MG PO TABS: 1.0000 | ORAL_TABLET | Freq: Four times a day (QID) | ORAL | 0 refills | Status: DC | PRN

## 2024-07-05 ENCOUNTER — Other Ambulatory Visit: Payer: Self-pay | Admitting: Family Medicine

## 2024-07-05 DIAGNOSIS — F419 Anxiety disorder, unspecified: Secondary | ICD-10-CM

## 2024-07-13 ENCOUNTER — Encounter: Payer: Self-pay | Admitting: Family Medicine

## 2024-07-14 ENCOUNTER — Other Ambulatory Visit: Payer: Self-pay

## 2024-07-14 DIAGNOSIS — M51379 Other intervertebral disc degeneration, lumbosacral region without mention of lumbar back pain or lower extremity pain: Secondary | ICD-10-CM

## 2024-07-14 MED ORDER — HYDROCODONE-ACETAMINOPHEN 5-325 MG PO TABS: 1.0000 | ORAL_TABLET | Freq: Four times a day (QID) | ORAL | 0 refills | Status: DC | PRN

## 2024-07-14 NOTE — Telephone Encounter (Signed)
 LOV 03/05/24 NOV no new ov scheduled LRF 07/01/24 qty:60 r:0

## 2024-07-26 ENCOUNTER — Encounter: Payer: Self-pay | Admitting: Family Medicine

## 2024-07-27 ENCOUNTER — Other Ambulatory Visit: Payer: Self-pay

## 2024-07-27 DIAGNOSIS — M51379 Other intervertebral disc degeneration, lumbosacral region without mention of lumbar back pain or lower extremity pain: Secondary | ICD-10-CM

## 2024-07-30 ENCOUNTER — Encounter: Payer: Self-pay | Admitting: Family Medicine

## 2024-07-30 ENCOUNTER — Other Ambulatory Visit: Payer: Self-pay

## 2024-07-30 DIAGNOSIS — M51379 Other intervertebral disc degeneration, lumbosacral region without mention of lumbar back pain or lower extremity pain: Secondary | ICD-10-CM

## 2024-07-30 NOTE — Telephone Encounter (Signed)
 LOV 03/05/24 NOV 08/13/24 LRF 07/14/24 qty:60 r:0

## 2024-08-02 MED ORDER — HYDROCODONE-ACETAMINOPHEN 5-325 MG PO TABS: 1.0000 | ORAL_TABLET | Freq: Four times a day (QID) | ORAL | 0 refills | Status: DC | PRN

## 2024-08-11 ENCOUNTER — Other Ambulatory Visit: Payer: Self-pay

## 2024-08-11 ENCOUNTER — Encounter: Payer: Self-pay | Admitting: Family Medicine

## 2024-08-11 DIAGNOSIS — M51379 Other intervertebral disc degeneration, lumbosacral region without mention of lumbar back pain or lower extremity pain: Secondary | ICD-10-CM

## 2024-08-11 NOTE — Telephone Encounter (Signed)
 Converted to rx request

## 2024-08-11 NOTE — Telephone Encounter (Signed)
 LOV- 03/05/2024 NOV- 08/13/2024 LRF- 08/02/2024 Outpatient Medication Detail   Disp Refills Start End   HYDROcodone -acetaminophen  (NORCO/VICODIN) 5-325 MG tablet 60 tablet 0 08/02/2024 --   Sig - Route: Take 1-2 tablets by mouth every 6 (six) hours as needed for moderate pain (pain score 4-6). - Oral   Sent to pharmacy as: HYDROcodone -acetaminophen  (NORCO/VICODIN) 5-325 MG tablet   Earliest Fill Date: 08/02/2024   E-Prescribing Status: Receipt confirmed by pharmacy (08/02/2024  7:37 AM EST)

## 2024-08-12 MED ORDER — HYDROCODONE-ACETAMINOPHEN 5-325 MG PO TABS: 1.0000 | ORAL_TABLET | Freq: Four times a day (QID) | ORAL | 0 refills | Status: AC | PRN

## 2024-08-13 ENCOUNTER — Encounter: Payer: Self-pay | Admitting: Family Medicine

## 2024-08-13 ENCOUNTER — Ambulatory Visit (INDEPENDENT_AMBULATORY_CARE_PROVIDER_SITE_OTHER): Admitting: Family Medicine

## 2024-08-13 VITALS — BP 154/59 | HR 68 | Resp 16 | Ht 72.0 in | Wt 203.0 lb

## 2024-08-13 DIAGNOSIS — E559 Vitamin D deficiency, unspecified: Secondary | ICD-10-CM

## 2024-08-13 DIAGNOSIS — K2100 Gastro-esophageal reflux disease with esophagitis, without bleeding: Secondary | ICD-10-CM

## 2024-08-13 DIAGNOSIS — D696 Thrombocytopenia, unspecified: Secondary | ICD-10-CM | POA: Diagnosis not present

## 2024-08-13 DIAGNOSIS — M109 Gout, unspecified: Secondary | ICD-10-CM

## 2024-08-13 DIAGNOSIS — Z23 Encounter for immunization: Secondary | ICD-10-CM | POA: Diagnosis not present

## 2024-08-13 DIAGNOSIS — I10 Essential (primary) hypertension: Secondary | ICD-10-CM | POA: Diagnosis not present

## 2024-08-13 DIAGNOSIS — I4819 Other persistent atrial fibrillation: Secondary | ICD-10-CM | POA: Diagnosis not present

## 2024-08-13 DIAGNOSIS — F419 Anxiety disorder, unspecified: Secondary | ICD-10-CM

## 2024-08-13 DIAGNOSIS — E782 Mixed hyperlipidemia: Secondary | ICD-10-CM

## 2024-08-13 DIAGNOSIS — G4733 Obstructive sleep apnea (adult) (pediatric): Secondary | ICD-10-CM

## 2024-08-13 NOTE — Progress Notes (Signed)
 "     Established patient visit   Patient: Isaac Hancock   DOB: 03/01/1956   69 y.o. Male  MRN: [id number] Visit Date: 08/13/2024  Today's healthcare provider: Nancyann Perry, MD   Chief Complaint  Patient presents with   Medication Refill   Follow-up    F/u    Subjective    Discussed the use of AI scribe software for clinical note transcription with the patient, who gave verbal consent to proceed.  History of Present Illness   Isaac Hancock is a 69 year old male who presents for a routine checkup and medication review.  He is currently taking Lipitor and Vascepa , which he describes as 'the little gel capsules.' He has no trouble with these medications and confirms they are working well for him. He coordinates his appointments to share blood work results, including cholesterol and INR levels, to avoid multiple blood draws. He has an upcoming appointment with his cardiologist on Tuesday.  He reports a recent instance of hand pain about a week ago, which he suspects might be gout, although he is not certain. It has been a long time since he last experienced gout symptoms. No unusual swelling in his hands, feet, or ankles, just his 'regular' swelling.  He is due for a Cologuard test for colon cancer screening, which was previously rejected for unknown reasons, possibly related to insurance. He prefers to continue with the Cologuard test rather than a colonoscopy.     Lab Results  Component Value Date   CHOL 160 07/04/2023   HDL 76 07/04/2023   LDLCALC 67 07/04/2023   TRIG 93 07/04/2023   CHOLHDL 2.1 07/04/2023   Lab Results  Component Value Date   NA 135 03/05/2024   K 4.1 03/05/2024   CREATININE 0.92 03/05/2024   EGFR 92 03/05/2024   GLUCOSE 100 (H) 03/05/2024   Lab Results  Component Value Date   WBC 4.4 03/05/2024   HGB 11.6 (L) 03/05/2024   HCT 34.1 (L) 03/05/2024   MCV 108 (H) 03/05/2024   PLT 108 (L) 03/05/2024     Medications: Show/hide medication  list[1] Review of Systems     Objective    BP (!) 154/59 (BP Location: Left Arm, Patient Position: Sitting, Cuff Size: Large)   Pulse 68   Resp 16   Ht 6' (1.829 m)   Wt 203 lb (92.1 kg)   SpO2 100%   BMI 27.53 kg/m   Physical Exam   General: Appearance:    Well developed, well nourished male in no acute distress  Eyes:    PERRL, conjunctiva/corneas clear, EOM's intact       Lungs:     Clear to auscultation bilaterally, respirations unlabored  Heart:    Normal heart rate. Irregularly irregular rhythm. No murmurs, rubs, or gallops.    MS:   All extremities are intact.    Neurologic:   Awake, alert, oriented x 3. No apparent focal neurological defect.       Assessment & Plan    1. Persistent atrial fibrillation (HCC) (Primary)  - INR/PT  Follow up Dr. Fernand next week as already scheduled.   2. Primary hypertension Labile blood pressure.   3. Thrombocytopenia Check CBC today. Advised will obtain abdominal ultrasound if platelets remain unusually low.   4. Hyperlipidemia, mixed Doing well on atorvastatin  and Vascepa .   - Comprehensive metabolic panel with GFR - Lipid panel  5. Vitamin D  deficiency  - VITAMIN D  25 Hydroxy (Vit-D Deficiency,  Fractures)  6. Need for influenza vaccination  - Flu vaccine HIGH DOSE PF(Fluzone Trivalent)      Nancyann Perry, MD  [facility] [phone] (phone) [phone] (fax)  [facility]    [1]  Outpatient Medications Prior to Visit  Medication Sig   ALPRAZolam  (XANAX ) 0.5 MG tablet TAKE 1 TABLET(0.5 MG) BY MOUTH THREE TIMES DAILY AS NEEDED FOR ANXIETY   amLODipine  (NORVASC ) 10 MG tablet TAKE 1 TABLET DAILY   atorvastatin  (LIPITOR) 80 MG tablet TAKE 1 TABLET DAILY   colchicine  0.6 MG tablet TAKE 2 TABLET ON FIRST DAY GOUT FLARE, THEN TAKE 1 TABLET DAILY AS NEEDED. REDUCE ATORVASTATIN  TO 1/2 DAILY ON DAYS YOU TAKE COLCHICINE    cyclobenzaprine  (FLEXERIL ) 10 MG tablet Take 1 tablet (10  mg total) by mouth 3 (three) times daily as needed for muscle spasms.   enalapril (VASOTEC) 20 MG tablet TAKE 1 TABLET TWICE A DAY   fexofenadine (ALLEGRA) 60 MG tablet Take 60 mg by mouth 2 (two) times daily.    HYDROcodone -acetaminophen  (NORCO/VICODIN) 5-325 MG tablet Take 1-2 tablets by mouth every 6 (six) hours as needed for moderate pain (pain score 4-6).   icosapent  Ethyl (VASCEPA ) 1 g capsule TAKE 2 CAPSULES TWICE A DAY (TAKE IN PLACE OF LOVAZA , OMEGA-3 ETHYL ESTERS)   metoprolol  (TOPROL -XL) 200 MG 24 hr tablet TAKE 1 TABLET DAILY (NEED TO SCHEDULE OFFICE VISIT FOR FOLLOW UP)   MULTIPLE VITAMIN PO Take 1 tablet by mouth daily.    NON FORMULARY    omeprazole  (PRILOSEC) 20 MG capsule TAKE 1 CAPSULE DAILY   sildenafil  (VIAGRA ) 50 MG tablet TAKE 1 TABLET DAILY AS NEEDED FOR ERECTILE DYSFUNCTION   Vitamin D , Ergocalciferol , (DRISDOL) 1.25 MG (50000 UNIT) CAPS capsule TAKE 1 CAPSULE ONCE WEEKLY   warfarin (COUMADIN ) 1 MG tablet Take 1 tablet (1 mg total) by mouth daily.   warfarin (COUMADIN ) 2 MG tablet TAKE 1 TABLET(2 MG) BY MOUTH DAILY   warfarin (COUMADIN ) 3 MG tablet Take 3 mg by mouth.   warfarin (COUMADIN ) 4 MG tablet TAKE 1 TABLET DAILY   warfarin (COUMADIN ) 6 MG tablet TAKE 1 TABLET BY MOUTH EVERY DAY AS DIRECTED   No facility-administered medications prior to visit.   "

## 2024-08-14 ENCOUNTER — Ambulatory Visit: Payer: Self-pay | Admitting: Family Medicine

## 2024-08-14 DIAGNOSIS — D539 Nutritional anemia, unspecified: Secondary | ICD-10-CM

## 2024-08-14 DIAGNOSIS — D696 Thrombocytopenia, unspecified: Secondary | ICD-10-CM

## 2024-08-14 LAB — LIPID PANEL
Chol/HDL Ratio: 1.9 ratio (ref 0.0–5.0)
Cholesterol, Total: 154 mg/dL (ref 100–199)
HDL: 81 mg/dL
LDL Chol Calc (NIH): 60 mg/dL (ref 0–99)
Triglycerides: 65 mg/dL (ref 0–149)
VLDL Cholesterol Cal: 13 mg/dL (ref 5–40)

## 2024-08-14 LAB — COMPREHENSIVE METABOLIC PANEL WITH GFR
ALT: 16 [IU]/L (ref 0–44)
AST: 30 [IU]/L (ref 0–40)
Albumin: 4.5 g/dL (ref 3.9–4.9)
Alkaline Phosphatase: 83 [IU]/L (ref 47–123)
BUN/Creatinine Ratio: 7 — ABNORMAL LOW (ref 10–24)
BUN: 8 mg/dL (ref 8–27)
Bilirubin Total: 1.2 mg/dL (ref 0.0–1.2)
CO2: 27 mmol/L (ref 20–29)
Calcium: 9.1 mg/dL (ref 8.6–10.2)
Chloride: 92 mmol/L — ABNORMAL LOW (ref 96–106)
Creatinine, Ser: 1.07 mg/dL (ref 0.76–1.27)
Globulin, Total: 2.3 g/dL (ref 1.5–4.5)
Glucose: 110 mg/dL — ABNORMAL HIGH (ref 70–99)
Potassium: 4.2 mmol/L (ref 3.5–5.2)
Sodium: 132 mmol/L — ABNORMAL LOW (ref 134–144)
Total Protein: 6.8 g/dL (ref 6.0–8.5)
eGFR: 77 mL/min/{1.73_m2}

## 2024-08-14 LAB — CBC
Hematocrit: 32 % — ABNORMAL LOW (ref 37.5–51.0)
Hemoglobin: 10.9 g/dL — ABNORMAL LOW (ref 13.0–17.7)
MCH: 35.7 pg — ABNORMAL HIGH (ref 26.6–33.0)
MCHC: 34.1 g/dL (ref 31.5–35.7)
MCV: 105 fL — ABNORMAL HIGH (ref 79–97)
Platelets: 138 10*3/uL — ABNORMAL LOW (ref 150–450)
RBC: 3.05 x10E6/uL — ABNORMAL LOW (ref 4.14–5.80)
RDW: 12.6 % (ref 11.6–15.4)
WBC: 6.8 10*3/uL (ref 3.4–10.8)

## 2024-08-14 LAB — VITAMIN D 25 HYDROXY (VIT D DEFICIENCY, FRACTURES): Vit D, 25-Hydroxy: 74.3 ng/mL (ref 30.0–100.0)

## 2024-08-14 LAB — PROTIME-INR
INR: 3.6 — ABNORMAL HIGH (ref 0.9–1.2)
Prothrombin Time: 35.8 s — ABNORMAL HIGH (ref 9.1–12.0)

## 2024-08-17 ENCOUNTER — Ambulatory Visit: Admitting: Cardiovascular Disease

## 2024-08-17 ENCOUNTER — Encounter: Payer: Self-pay | Admitting: Cardiovascular Disease

## 2024-08-17 VITALS — BP 142/68 | HR 67 | Ht 72.0 in | Wt 203.8 lb

## 2024-08-17 DIAGNOSIS — E871 Hypo-osmolality and hyponatremia: Secondary | ICD-10-CM | POA: Diagnosis not present

## 2024-08-17 DIAGNOSIS — I10 Essential (primary) hypertension: Secondary | ICD-10-CM

## 2024-08-17 DIAGNOSIS — I4819 Other persistent atrial fibrillation: Secondary | ICD-10-CM | POA: Diagnosis not present

## 2024-08-17 DIAGNOSIS — K2100 Gastro-esophageal reflux disease with esophagitis, without bleeding: Secondary | ICD-10-CM

## 2024-08-17 DIAGNOSIS — G4733 Obstructive sleep apnea (adult) (pediatric): Secondary | ICD-10-CM

## 2024-08-17 DIAGNOSIS — R609 Edema, unspecified: Secondary | ICD-10-CM | POA: Diagnosis not present

## 2024-08-17 DIAGNOSIS — Z952 Presence of prosthetic heart valve: Secondary | ICD-10-CM

## 2024-08-17 DIAGNOSIS — E782 Mixed hyperlipidemia: Secondary | ICD-10-CM | POA: Diagnosis not present

## 2024-09-17 ENCOUNTER — Other Ambulatory Visit

## 2024-09-23 ENCOUNTER — Ambulatory Visit: Admitting: Cardiovascular Disease

## 2025-02-11 ENCOUNTER — Ambulatory Visit: Admitting: Family Medicine
